# Patient Record
Sex: Female | Born: 1970 | Race: White | Hispanic: No | Smoking: Current some day smoker
Health system: Southern US, Community
[De-identification: ages and names within clinical notes are randomized; demographics above are authoritative.]

## PROBLEM LIST (undated history)

## (undated) ENCOUNTER — Inpatient Hospital Stay: Payer: Self-pay | Admitting: Obstetrics and Gynecology

## (undated) DIAGNOSIS — T4145XA Adverse effect of unspecified anesthetic, initial encounter: Secondary | ICD-10-CM

## (undated) DIAGNOSIS — I10 Essential (primary) hypertension: Secondary | ICD-10-CM

## (undated) DIAGNOSIS — T8859XA Other complications of anesthesia, initial encounter: Secondary | ICD-10-CM

## (undated) DIAGNOSIS — F329 Major depressive disorder, single episode, unspecified: Secondary | ICD-10-CM

## (undated) DIAGNOSIS — F32A Depression, unspecified: Secondary | ICD-10-CM

## (undated) DIAGNOSIS — E7800 Pure hypercholesterolemia, unspecified: Secondary | ICD-10-CM

## (undated) DIAGNOSIS — I2699 Other pulmonary embolism without acute cor pulmonale: Secondary | ICD-10-CM

## (undated) DIAGNOSIS — I219 Acute myocardial infarction, unspecified: Secondary | ICD-10-CM

## (undated) DIAGNOSIS — J45909 Unspecified asthma, uncomplicated: Secondary | ICD-10-CM

## (undated) DIAGNOSIS — E119 Type 2 diabetes mellitus without complications: Secondary | ICD-10-CM

## (undated) DIAGNOSIS — I639 Cerebral infarction, unspecified: Secondary | ICD-10-CM

## (undated) DIAGNOSIS — R519 Headache, unspecified: Secondary | ICD-10-CM

## (undated) DIAGNOSIS — K3184 Gastroparesis: Secondary | ICD-10-CM

## (undated) DIAGNOSIS — F419 Anxiety disorder, unspecified: Secondary | ICD-10-CM

## (undated) DIAGNOSIS — C539 Malignant neoplasm of cervix uteri, unspecified: Secondary | ICD-10-CM

## (undated) HISTORY — PX: TUBAL LIGATION: SHX77

## (undated) HISTORY — PX: TONSILLECTOMY: SUR1361

---
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
## 2005-02-09 DIAGNOSIS — C539 Malignant neoplasm of cervix uteri, unspecified: Secondary | ICD-10-CM

## 2005-02-09 HISTORY — DX: Malignant neoplasm of cervix uteri, unspecified: C53.9

## 2011-02-10 DIAGNOSIS — I639 Cerebral infarction, unspecified: Secondary | ICD-10-CM

## 2011-02-10 HISTORY — DX: Cerebral infarction, unspecified: I63.9

## 2014-02-09 DIAGNOSIS — I219 Acute myocardial infarction, unspecified: Secondary | ICD-10-CM

## 2014-02-09 HISTORY — DX: Acute myocardial infarction, unspecified: I21.9

## 2014-10-25 DIAGNOSIS — F319 Bipolar disorder, unspecified: Secondary | ICD-10-CM | POA: Insufficient documentation

## 2015-08-08 DIAGNOSIS — F1120 Opioid dependence, uncomplicated: Secondary | ICD-10-CM | POA: Insufficient documentation

## 2015-08-08 DIAGNOSIS — F603 Borderline personality disorder: Secondary | ICD-10-CM | POA: Insufficient documentation

## 2016-01-09 DIAGNOSIS — Z9181 History of falling: Secondary | ICD-10-CM | POA: Insufficient documentation

## 2016-03-03 DIAGNOSIS — T1491XA Suicide attempt, initial encounter: Secondary | ICD-10-CM | POA: Insufficient documentation

## 2016-06-15 DIAGNOSIS — R632 Polyphagia: Secondary | ICD-10-CM | POA: Insufficient documentation

## 2016-08-02 DIAGNOSIS — F1990 Other psychoactive substance use, unspecified, uncomplicated: Secondary | ICD-10-CM | POA: Diagnosis not present

## 2016-08-02 DIAGNOSIS — E119 Type 2 diabetes mellitus without complications: Secondary | ICD-10-CM | POA: Insufficient documentation

## 2016-08-02 DIAGNOSIS — G9340 Encephalopathy, unspecified: Secondary | ICD-10-CM | POA: Diagnosis not present

## 2016-08-02 DIAGNOSIS — E1165 Type 2 diabetes mellitus with hyperglycemia: Secondary | ICD-10-CM | POA: Diagnosis present

## 2016-08-02 NOTE — ED Triage Notes (Signed)
Patient presents with complaints of hyperglycemia. Her SO states she has taken her insulin like she is supposed to but is having trouble speaking clearing despite denying alcohol or drug use.

## 2016-08-03 ENCOUNTER — Emergency Department: Payer: Medicare Other

## 2016-08-03 ENCOUNTER — Emergency Department
Admission: EM | Admit: 2016-08-03 | Discharge: 2016-08-03 | Disposition: A | Discharge status: Home / Self Care | Payer: Medicare Other | Attending: Emergency Medicine | Admitting: Emergency Medicine

## 2016-08-03 DIAGNOSIS — F1910 Other psychoactive substance abuse, uncomplicated: Secondary | ICD-10-CM

## 2016-08-03 DIAGNOSIS — G9340 Encephalopathy, unspecified: Secondary | ICD-10-CM

## 2016-08-03 HISTORY — DX: Type 2 diabetes mellitus without complications: E11.9

## 2016-08-03 LAB — URINALYSIS, COMPLETE (UACMP) WITH MICROSCOPIC
BACTERIA UA: NONE SEEN
Bilirubin Urine: NEGATIVE
GLUCOSE, UA: NEGATIVE mg/dL
HGB URINE DIPSTICK: NEGATIVE
KETONES UR: 5 mg/dL — AB
Leukocytes, UA: NEGATIVE
NITRITE: NEGATIVE
PROTEIN: 30 mg/dL — AB
Specific Gravity, Urine: 1.028 (ref 1.005–1.030)
pH: 5 (ref 5.0–8.0)

## 2016-08-03 LAB — DIFFERENTIAL
BASOS ABS: 0.1 10*3/uL (ref 0–0.1)
Basophils Relative: 1 %
EOS ABS: 0.3 10*3/uL (ref 0–0.7)
Eosinophils Relative: 3 %
Lymphocytes Relative: 37 %
Lymphs Abs: 4.1 10*3/uL — ABNORMAL HIGH (ref 1.0–3.6)
Monocytes Absolute: 0.6 10*3/uL (ref 0.2–0.9)
Monocytes Relative: 6 %
NEUTROS ABS: 6 10*3/uL (ref 1.4–6.5)
NEUTROS PCT: 53 %

## 2016-08-03 LAB — COMPREHENSIVE METABOLIC PANEL
ALBUMIN: 4.5 g/dL (ref 3.5–5.0)
ALT: 19 U/L (ref 14–54)
AST: 36 U/L (ref 15–41)
Alkaline Phosphatase: 83 U/L (ref 38–126)
Anion gap: 8 (ref 5–15)
BUN: 11 mg/dL (ref 6–20)
CHLORIDE: 105 mmol/L (ref 101–111)
CO2: 23 mmol/L (ref 22–32)
Calcium: 9.3 mg/dL (ref 8.9–10.3)
Creatinine, Ser: 1.52 mg/dL — ABNORMAL HIGH (ref 0.44–1.00)
GFR calc Af Amer: 46 mL/min — ABNORMAL LOW (ref >60)
GFR calc non Af Amer: 40 mL/min — ABNORMAL LOW (ref >60)
GLUCOSE: 215 mg/dL — AB (ref 65–99)
POTASSIUM: 4.6 mmol/L (ref 3.5–5.1)
SODIUM: 136 mmol/L (ref 135–145)
Total Bilirubin: 1 mg/dL (ref 0.3–1.2)
Total Protein: 8.4 g/dL — ABNORMAL HIGH (ref 6.5–8.1)

## 2016-08-03 LAB — CBC
HCT: 40.4 % (ref 35.0–47.0)
Hemoglobin: 14 g/dL (ref 12.0–16.0)
MCH: 31.3 pg (ref 26.0–34.0)
MCHC: 34.6 g/dL (ref 32.0–36.0)
MCV: 90.4 fL (ref 80.0–100.0)
PLATELETS: 221 10*3/uL (ref 150–440)
RBC: 4.46 MIL/uL (ref 3.80–5.20)
RDW: 14 % (ref 11.5–14.5)
WBC: 11.1 10*3/uL — AB (ref 3.6–11.0)

## 2016-08-03 LAB — URINE DRUG SCREEN, QUALITATIVE (ARMC ONLY)
Amphetamines, Ur Screen: NOT DETECTED
Barbiturates, Ur Screen: NOT DETECTED
Benzodiazepine, Ur Scrn: POSITIVE — AB
COCAINE METABOLITE, UR ~~LOC~~: NOT DETECTED
Cannabinoid 50 Ng, Ur ~~LOC~~: NOT DETECTED
MDMA (ECSTASY) UR SCREEN: NOT DETECTED
Methadone Scn, Ur: NOT DETECTED
OPIATE, UR SCREEN: POSITIVE — AB
Phencyclidine (PCP) Ur S: NOT DETECTED
TRICYCLIC, UR SCREEN: POSITIVE — AB

## 2016-08-03 LAB — GLUCOSE, CAPILLARY
GLUCOSE-CAPILLARY: 196 mg/dL — AB (ref 65–99)
GLUCOSE-CAPILLARY: 231 mg/dL — AB (ref 65–99)

## 2016-08-03 LAB — POCT PREGNANCY, URINE: Preg Test, Ur: NEGATIVE

## 2016-08-03 LAB — ETHANOL

## 2016-08-03 MED ORDER — SODIUM CHLORIDE 0.9 % IV BOLUS (SEPSIS)
1000.0000 mL | Freq: Once | INTRAVENOUS | Status: AC
  Administered 2016-08-03: 1000 mL via INTRAVENOUS

## 2016-08-03 MED ORDER — NALOXONE HCL 2 MG/2ML IJ SOSY
0.4000 mg | PREFILLED_SYRINGE | Freq: Once | INTRAMUSCULAR | Status: AC
  Administered 2016-08-03: 0.4 mg via INTRAVENOUS

## 2016-08-03 MED ORDER — NALOXONE HCL 2 MG/2ML IJ SOSY
PREFILLED_SYRINGE | INTRAMUSCULAR | Status: AC
  Filled 2016-08-03: qty 2

## 2016-08-03 NOTE — ED Provider Notes (Signed)
[facility] Emergency Department Provider Note  ____________________________________________  Time seen: Approximately 12:53 AM  I have reviewed the triage vital signs and the nursing notes.   HISTORY  Chief Complaint Hyperglycemia  Level 5 caveat:  Portions of the history and physical were unable to be obtained due to ams   HPI Alejandra Frederick is a 46 y.o. female with a history of diabetes, PTSD, and anxiety who presents for evaluation of hyperglycemia. Patient initially presented to triage with complaints of elevated blood glucose. After being in the waiting room for 30 minutes patient became unresponsive. She has her eyes opened starring. With sternal rub the patient will make eye contact and attempts to speak and tell me that she has a very bad headache. She is accompanied by her fianc of 3 months. He tells me that he has never seen patient do this before. He denies that she uses any drugs. he denies that she took anything in the waiting room. He said that he brought her here today because she was having a hard time controlling her sugars. She was otherwise well. All patient mumbles is "I have a bad headache". She does follow commands and move all 4 extremities.   Past Medical History:  Diagnosis Date  . Diabetes mellitus without complication ([place])     There are no active problems to display for this patient.   History reviewed. No pertinent surgical history.  Prior to Admission medications   Not on File    Allergies Ciprofloxacin  No family history on file.  Social History Social History  Substance Use Topics  . Smoking status: Not on file  . Smokeless tobacco: Not on file  . Alcohol use Not on file    Review of Systems Level 5 caveat:  Portions of the history and physical were unable to be obtained due to ams  ____________________________________________   PHYSICAL EXAM:  VITAL SIGNS: ED Triage Vitals  Enc Vitals Group   BP 08/02/16 2357 119/72     Pulse Rate 08/02/16 2356 98     Resp 08/02/16 2356 18     Temp --      Temp Source 08/02/16 2356 Oral     SpO2 08/02/16 2357 97 %     Weight 08/02/16 2356 167 lb (75.8 kg)     Height 08/02/16 2356 5\' 4"  (1.626 m)     Head Circumference --      Peak Flow --      Pain Score 08/02/16 2355 0     Pain Loc --      Pain Edu? --      Excl. in [place]? --     Constitutional: Patient's eyes are open and staring to the wall, and I talked to her she would not respond but when I sternal rubbed her she will look at me and starts mumbling words, she says her head hurts. HEENT:      Head: Normocephalic and atraumatic.         Eyes: Conjunctivae are normal. Sclera is non-icteric. Pupils are 2 mm and reactive      Mouth/Throat: Mucous membranes are moist.       Neck: Supple with no signs of meningismus. Cardiovascular: Regular rate and rhythm. No murmurs, gallops, or rubs. 2+ symmetrical distal pulses are present in all extremities. No JVD. Respiratory: Normal respiratory effort. Lungs are clear to auscultation bilaterally. No wheezes, crackles, or rhonchi.  Gastrointestinal: Soft, non tender, and non distended with positive bowel  sounds. No rebound or guarding. Musculoskeletal: Nontender with normal range of motion in all extremities. No edema, cyanosis, or erythema of extremities. Neurologic: Patient is able to follow commands. Skin: Skin is warm, dry and intact. No rash noted.  ____________________________________________   LABS (all labs ordered are listed, but only abnormal results are displayed)  Labs Reviewed  GLUCOSE, CAPILLARY - Abnormal; Notable for the following:       Result Value   Glucose-Capillary 231 (*)    All other components within normal limits  CBC - Abnormal; Notable for the following:    WBC 11.1 (*)    All other components within normal limits  DIFFERENTIAL - Abnormal; Notable for the following:    Lymphs Abs 4.1 (*)    All other components  within normal limits  COMPREHENSIVE METABOLIC PANEL - Abnormal; Notable for the following:    Glucose, Bld 215 (*)    Creatinine, Ser 1.52 (*)    Total Protein 8.4 (*)    GFR calc non Af Amer 40 (*)    GFR calc Af Amer 46 (*)    All other components within normal limits  URINE DRUG SCREEN, QUALITATIVE ([facility] ONLY) - Abnormal; Notable for the following:    Tricyclic, Ur Screen POSITIVE (*)    Opiate, Ur Screen POSITIVE (*)    Benzodiazepine, Ur Scrn POSITIVE (*)    All other components within normal limits  URINALYSIS, COMPLETE (UACMP) WITH MICROSCOPIC - Abnormal; Notable for the following:    Color, Urine AMBER (*)    APPearance HAZY (*)    Ketones, ur 5 (*)    Protein, ur 30 (*)    Squamous Epithelial / LPF 0-5 (*)    All other components within normal limits  GLUCOSE, CAPILLARY - Abnormal; Notable for the following:    Glucose-Capillary 196 (*)    All other components within normal limits  ETHANOL  PROTIME-INR  APTT  POC URINE PREG, ED  POCT PREGNANCY, URINE   ____________________________________________  EKG  ED ECG REPORT I, Rudene Re, the attending physician, personally viewed and interpreted this ECG.  Sinus tachycardia, rate of 106, normal intervals, normal axis, no ST elevations or depressions. ____________________________________________  RADIOLOGY  CT head: negative  ____________________________________________   PROCEDURES  Procedure(s) performed: None Procedures Critical Care performed:  None ____________________________________________   INITIAL IMPRESSION / ASSESSMENT AND PLAN / ED COURSE  46 y.o. female with a history of diabetes, PTSD, and anxiety who presents for evaluation of hyperglycemia and developed a sudden episode of unresponsiveness however she does respond with sternal rubbing and is moving all 4 extremities and following commands. And after talking to me patient goes back to staring at the wall but will respond again if I  sternal rubbed her. Her head CT is negative. Do not believe patient had an acute stroke as she is moving all 4 extremities. Will check tox screen, labs, monitor closely.   ----------------------------------------- 12:57 AM on 08/03/2016 -----------------------------------------   OBSERVATION CARE: This patient is being placed under observation care for the following reasons: Questionable overdose observed to r/o significant toxicity    Clinical Course as of Aug 04 755  Mon Aug 03, 2016  0243 Tox screen positive for tricyclics, opiates, and benzos. Patient was given 0.4 mg of Narcan with no changes in her behavior. Patient noted to be speaking with her fianc when we are not in the room. We'll continue to monitor the patient looks clinically sober.  [CV]  [phone] Patient is now more  awake. Tells me that she took Klonopin, gabapentin, vicodin, and buspar just before coming to the ED. She is prescribed all of these medications and tells me that she usually takes those right before bedtime. She took them and then decided to come to the emergency room because of her elevated BG. She says she feels groggy but is asking for coffee. we'll give her breakfast. She remains neuro intact. She has no complaints at this time. Fianc remains at bedside and able drive patient home when she is discharged.  [CV]  [id number] Patient is now back to her baseline, tolerating by mouth and ambulating without difficulty. Had a very long discussion with her and her fianc about the risks of mixing all of these medications including risks of apnea, death, or if she is driving under the influence that she could hurt herself or others. Recommended that she discontinue one of the 3 medications and follow-up with her primary care doctor this week for further management.  [CV]    Clinical Course User Index [CV] Rudene Re, MD   ----------------------------------------- 7:57 AM on  08/03/2016 -----------------------------------------   END OF OBSERVATION STATUS: After an appropriate period of observation, this patient is being discharged     Pertinent labs & imaging results that were available during my care of the patient were reviewed by me and considered in my medical decision making (see chart for details).    ____________________________________________   FINAL CLINICAL IMPRESSION(S) / ED DIAGNOSES  Final diagnoses:  Encephalopathy acute  Polysubstance abuse      NEW MEDICATIONS STARTED DURING THIS VISIT:  New Prescriptions   No medications on file     Note:  This document was prepared using Dragon voice recognition software and may include unintentional dictation errors.    Rudene Re, MD 08/03/16 [phone]

## 2016-08-03 NOTE — Discharge Instructions (Signed)
Follow up with your doctor in 2 days. Be careful when taking too many sedating medications as you can stop breathing.

## 2016-08-03 NOTE — ED Notes (Signed)
Pt's visitor out to desk stating pt is awake and c/o a "really really bad headache"; notified primary nurse of same

## 2016-08-03 NOTE — ED Notes (Signed)
Pt's monitor alarming; BP reading low; cuff adjusted and recheck is 106/74; pt shivering; temp 97.7; thermostat adjusted in room and pt covered with more warm blankets

## 2016-08-03 NOTE — ED Notes (Signed)
Pt came back to room in wheelchair, pt was unable to get into bed, patient was helped into bed by staff and was unresponsive to voice, touch, pain, sometimes patient will respond but only for a few minutes. MD called to room to access.

## 2016-08-03 NOTE — ED Notes (Signed)
Pt sitting up in the chair with husband, a/ox4. Pt states she is ready to go home and EDP is aware.

## 2016-08-22 ENCOUNTER — Emergency Department: Payer: Medicare Other

## 2016-08-22 ENCOUNTER — Inpatient Hospital Stay
Admission: EM | Admit: 2016-08-22 | Discharge: 2016-08-24 | DRG: 917 | Disposition: A | Discharge status: Home / Self Care | Payer: Medicare Other | Attending: Internal Medicine | Admitting: Internal Medicine

## 2016-08-22 DIAGNOSIS — T50901A Poisoning by unspecified drugs, medicaments and biological substances, accidental (unintentional), initial encounter: Principal | ICD-10-CM

## 2016-08-22 DIAGNOSIS — F3160 Bipolar disorder, current episode mixed, unspecified: Secondary | ICD-10-CM | POA: Diagnosis present

## 2016-08-22 DIAGNOSIS — J9601 Acute respiratory failure with hypoxia: Secondary | ICD-10-CM

## 2016-08-22 DIAGNOSIS — E872 Acidosis: Secondary | ICD-10-CM | POA: Diagnosis present

## 2016-08-22 DIAGNOSIS — T50901D Poisoning by unspecified drugs, medicaments and biological substances, accidental (unintentional), subsequent encounter: Secondary | ICD-10-CM | POA: Diagnosis not present

## 2016-08-22 DIAGNOSIS — R4189 Other symptoms and signs involving cognitive functions and awareness: Secondary | ICD-10-CM | POA: Diagnosis present

## 2016-08-22 DIAGNOSIS — Z881 Allergy status to other antibiotic agents status: Secondary | ICD-10-CM

## 2016-08-22 DIAGNOSIS — Z915 Personal history of self-harm: Secondary | ICD-10-CM

## 2016-08-22 DIAGNOSIS — E118 Type 2 diabetes mellitus with unspecified complications: Secondary | ICD-10-CM | POA: Diagnosis present

## 2016-08-22 DIAGNOSIS — Z79899 Other long term (current) drug therapy: Secondary | ICD-10-CM | POA: Diagnosis not present

## 2016-08-22 DIAGNOSIS — R030 Elevated blood-pressure reading, without diagnosis of hypertension: Secondary | ICD-10-CM | POA: Diagnosis present

## 2016-08-22 DIAGNOSIS — Z87898 Personal history of other specified conditions: Secondary | ICD-10-CM

## 2016-08-22 DIAGNOSIS — J9600 Acute respiratory failure, unspecified whether with hypoxia or hypercapnia: Secondary | ICD-10-CM | POA: Diagnosis present

## 2016-08-22 DIAGNOSIS — R4020 Unspecified coma: Secondary | ICD-10-CM | POA: Diagnosis not present

## 2016-08-22 DIAGNOSIS — R1110 Vomiting, unspecified: Secondary | ICD-10-CM

## 2016-08-22 DIAGNOSIS — Z794 Long term (current) use of insulin: Secondary | ICD-10-CM

## 2016-08-22 DIAGNOSIS — G9340 Encephalopathy, unspecified: Secondary | ICD-10-CM | POA: Diagnosis present

## 2016-08-22 DIAGNOSIS — R55 Syncope and collapse: Secondary | ICD-10-CM | POA: Diagnosis not present

## 2016-08-22 LAB — URINE DRUG SCREEN, QUALITATIVE (ARMC ONLY)
AMPHETAMINES, UR SCREEN: NOT DETECTED
BENZODIAZEPINE, UR SCRN: POSITIVE — AB
Barbiturates, Ur Screen: NOT DETECTED
Cannabinoid 50 Ng, Ur ~~LOC~~: NOT DETECTED
Cocaine Metabolite,Ur ~~LOC~~: NOT DETECTED
MDMA (ECSTASY) UR SCREEN: NOT DETECTED
METHADONE SCREEN, URINE: NOT DETECTED
OPIATE, UR SCREEN: NOT DETECTED
PHENCYCLIDINE (PCP) UR S: NOT DETECTED
Tricyclic, Ur Screen: POSITIVE — AB

## 2016-08-22 LAB — BASIC METABOLIC PANEL
ANION GAP: 11 (ref 5–15)
BUN: 11 mg/dL (ref 6–20)
CO2: 20 mmol/L — AB (ref 22–32)
Calcium: 9.5 mg/dL (ref 8.9–10.3)
Chloride: 105 mmol/L (ref 101–111)
Creatinine, Ser: 0.95 mg/dL (ref 0.44–1.00)
GFR calc Af Amer: >60 mL/min (ref >60)
GFR calc non Af Amer: >60 mL/min (ref >60)
GLUCOSE: 231 mg/dL — AB (ref 65–99)
POTASSIUM: 3.8 mmol/L (ref 3.5–5.1)
Sodium: 136 mmol/L (ref 135–145)

## 2016-08-22 LAB — URINALYSIS, COMPLETE (UACMP) WITH MICROSCOPIC
BACTERIA UA: NONE SEEN
BILIRUBIN URINE: NEGATIVE
Glucose, UA: 50 mg/dL — AB
Hgb urine dipstick: NEGATIVE
Ketones, ur: NEGATIVE mg/dL
Leukocytes, UA: NEGATIVE
Nitrite: NEGATIVE
PROTEIN: NEGATIVE mg/dL
SPECIFIC GRAVITY, URINE: 1.006 (ref 1.005–1.030)
pH: 6 (ref 5.0–8.0)

## 2016-08-22 LAB — CBC
HEMATOCRIT: 41.1 % (ref 35.0–47.0)
HEMOGLOBIN: 14.3 g/dL (ref 12.0–16.0)
MCH: 32 pg (ref 26.0–34.0)
MCHC: 34.7 g/dL (ref 32.0–36.0)
MCV: 92.1 fL (ref 80.0–100.0)
Platelets: 228 10*3/uL (ref 150–440)
RBC: 4.46 MIL/uL (ref 3.80–5.20)
RDW: 14.4 % (ref 11.5–14.5)
WBC: 10.4 10*3/uL (ref 3.6–11.0)

## 2016-08-22 LAB — GLUCOSE, CAPILLARY
GLUCOSE-CAPILLARY: 132 mg/dL — AB (ref 65–99)
Glucose-Capillary: 120 mg/dL — ABNORMAL HIGH (ref 65–99)
Glucose-Capillary: 135 mg/dL — ABNORMAL HIGH (ref 65–99)
Glucose-Capillary: 232 mg/dL — ABNORMAL HIGH (ref 65–99)

## 2016-08-22 LAB — LACTIC ACID, PLASMA
LACTIC ACID, VENOUS: 2.1 mmol/L — AB (ref 0.5–1.9)
Lactic Acid, Venous: 1.6 mmol/L (ref 0.5–1.9)

## 2016-08-22 LAB — SALICYLATE LEVEL: Salicylate Lvl: <7 mg/dL (ref 2.8–30.0)

## 2016-08-22 LAB — ETHANOL: Alcohol, Ethyl (B): <5 mg/dL (ref <5)

## 2016-08-22 LAB — TROPONIN I
TROPONIN I: 0.03 ng/mL — AB (ref <0.03)
Troponin I: 0.18 ng/mL (ref <0.03)

## 2016-08-22 LAB — TRIGLYCERIDES: TRIGLYCERIDES: 551 mg/dL — AB (ref <150)

## 2016-08-22 LAB — ACETAMINOPHEN LEVEL

## 2016-08-22 LAB — MRSA PCR SCREENING: MRSA BY PCR: NEGATIVE

## 2016-08-22 MED ORDER — PROPOFOL 1000 MG/100ML IV EMUL
INTRAVENOUS | Status: AC
  Filled 2016-08-22: qty 100

## 2016-08-22 MED ORDER — NALOXONE HCL 2 MG/2ML IJ SOSY
1.0000 mg | PREFILLED_SYRINGE | Freq: Once | INTRAMUSCULAR | Status: AC
  Administered 2016-08-22: 1 mg via INTRAVENOUS

## 2016-08-22 MED ORDER — ACETAMINOPHEN 325 MG PO TABS
650.0000 mg | ORAL_TABLET | Freq: Four times a day (QID) | ORAL | Status: DC | PRN
Start: 2016-08-22 — End: 2016-08-24
  Administered 2016-08-23: 650 mg via ORAL
  Filled 2016-08-22: qty 2

## 2016-08-22 MED ORDER — MIDAZOLAM HCL 5 MG/5ML IJ SOLN
4.0000 mg | Freq: Once | INTRAMUSCULAR | Status: AC
  Administered 2016-08-22: 4 mg via INTRAVENOUS

## 2016-08-22 MED ORDER — MIDAZOLAM HCL 2 MG/2ML IJ SOLN
2.0000 mg | INTRAMUSCULAR | Status: AC | PRN
  Administered 2016-08-22: 2 mg via INTRAVENOUS
  Administered 2016-08-22 – 2016-08-23 (×2): 4 mg via INTRAVENOUS
  Filled 2016-08-22: qty 2
  Filled 2016-08-22 (×2): qty 4

## 2016-08-22 MED ORDER — SODIUM CHLORIDE 0.9 % IV SOLN
INTRAVENOUS | Status: DC
  Administered 2016-08-22 (×2): via INTRAVENOUS

## 2016-08-22 MED ORDER — SUCCINYLCHOLINE CHLORIDE 20 MG/ML IJ SOLN
160.0000 mg | Freq: Once | INTRAMUSCULAR | Status: AC
  Administered 2016-08-22: 160 mg via INTRAVENOUS

## 2016-08-22 MED ORDER — NALOXONE HCL 4 MG/0.1ML NA LIQD: 1.0000 mg | NASAL | Status: DC

## 2016-08-22 MED ORDER — PROPOFOL 10 MG/ML IV BOLUS
40.0000 mg | Freq: Once | INTRAVENOUS | Status: AC
  Administered 2016-08-22: 40 mg via INTRAVENOUS
  Filled 2016-08-22: qty 20

## 2016-08-22 MED ORDER — ETOMIDATE 2 MG/ML IV SOLN
20.0000 mg | Freq: Once | INTRAVENOUS | Status: AC
  Administered 2016-08-22: 20 mg via INTRAVENOUS

## 2016-08-22 MED ORDER — FENTANYL CITRATE (PF) 100 MCG/2ML IJ SOLN
50.0000 ug | Freq: Once | INTRAMUSCULAR | Status: AC
Start: 2016-08-22 — End: 2016-08-22
  Administered 2016-08-22: 50 ug via INTRAVENOUS

## 2016-08-22 MED ORDER — PROPOFOL 1000 MG/100ML IV EMUL
INTRAVENOUS | Status: AC
  Administered 2016-08-22: 19:00:00
  Filled 2016-08-22: qty 100

## 2016-08-22 MED ORDER — CHLORHEXIDINE GLUCONATE 0.12% ORAL RINSE (MEDLINE KIT)
15.0000 mL | Freq: Two times a day (BID) | OROMUCOSAL | Status: DC
  Administered 2016-08-22 – 2016-08-23 (×2): 15 mL via OROMUCOSAL

## 2016-08-22 MED ORDER — PROPOFOL 1000 MG/100ML IV EMUL
5.0000 ug/kg/min | Freq: Once | INTRAVENOUS | Status: AC
  Administered 2016-08-22: 15 ug/kg/min via INTRAVENOUS
  Filled 2016-08-22: qty 100

## 2016-08-22 MED ORDER — PROPOFOL 1000 MG/100ML IV EMUL
5.0000 ug/kg/min | INTRAVENOUS | Status: DC
  Administered 2016-08-22: 60 ug/kg/min via INTRAVENOUS
  Administered 2016-08-23 (×2): 80 ug/kg/min via INTRAVENOUS
  Filled 2016-08-22 (×4): qty 100

## 2016-08-22 MED ORDER — MIDAZOLAM HCL 2 MG/2ML IJ SOLN
2.0000 mg | INTRAMUSCULAR | Status: DC | PRN
  Administered 2016-08-23: 4 mg via INTRAVENOUS
  Filled 2016-08-22: qty 4

## 2016-08-22 MED ORDER — PROPOFOL 1000 MG/100ML IV EMUL
5.0000 ug/kg/min | INTRAVENOUS | Status: DC
  Administered 2016-08-22: 60 ug/kg/min via INTRAVENOUS

## 2016-08-22 MED ORDER — INSULIN ASPART 100 UNIT/ML ~~LOC~~ SOLN
0.0000 [IU] | Freq: Three times a day (TID) | SUBCUTANEOUS | Status: DC
  Administered 2016-08-23: 1 [IU] via SUBCUTANEOUS
  Filled 2016-08-22: qty 1

## 2016-08-22 MED ORDER — SODIUM CHLORIDE 0.9 % IV BOLUS (SEPSIS)
1000.0000 mL | Freq: Once | INTRAVENOUS | Status: AC
  Administered 2016-08-22: 1000 mL via INTRAVENOUS

## 2016-08-22 MED ORDER — ENOXAPARIN SODIUM 40 MG/0.4ML ~~LOC~~ SOLN
40.0000 mg | SUBCUTANEOUS | Status: DC
  Administered 2016-08-22 – 2016-08-23 (×2): 40 mg via SUBCUTANEOUS
  Filled 2016-08-22 (×2): qty 0.4

## 2016-08-22 MED ORDER — ACETAMINOPHEN 650 MG RE SUPP: 650.0000 mg | Freq: Four times a day (QID) | RECTAL | Status: DC | PRN

## 2016-08-22 MED ORDER — MIDAZOLAM HCL 5 MG/5ML IJ SOLN
INTRAMUSCULAR | Status: AC
  Filled 2016-08-22: qty 5

## 2016-08-22 MED ORDER — PROPOFOL 10 MG/ML IV BOLUS
40.0000 mg | Freq: Once | INTRAVENOUS | Status: DC
  Filled 2016-08-22: qty 20

## 2016-08-22 MED ORDER — ONDANSETRON HCL 4 MG PO TABS: 4.0000 mg | ORAL_TABLET | Freq: Four times a day (QID) | ORAL | Status: DC | PRN

## 2016-08-22 MED ORDER — ONDANSETRON HCL 4 MG/2ML IJ SOLN
4.0000 mg | Freq: Four times a day (QID) | INTRAMUSCULAR | Status: DC | PRN
  Administered 2016-08-23: 4 mg via INTRAVENOUS
  Filled 2016-08-22: qty 2

## 2016-08-22 MED ORDER — ORAL CARE MOUTH RINSE
15.0000 mL | OROMUCOSAL | Status: DC
  Administered 2016-08-22 – 2016-08-23 (×5): 15 mL via OROMUCOSAL

## 2016-08-22 NOTE — ED Triage Notes (Signed)
Pt arrived via POV from home with reports of V/D, decreased appetite and headache. Pt c/o headache that started yesterday. Pt unable to keep head up. She is communicating some but is uncomprehensible.  Pt does respond to her name.

## 2016-08-22 NOTE — H&P (Signed)
[place] [facility] [place] NAME: Alejandra Frederick    MR#:  [id number]  DATE OF BIRTH:  October 26, 1970  DATE OF ADMISSION:  08/22/2016  PRIMARY CARE PHYSICIAN: Patient, No Pcp Per   REQUESTING/REFERRING PHYSICIAN: Dr Jacqualine Code  CHIEF COMPLAINT:   Patient was brought in and responsive. No family members at bedside. She is currently intubated on the ventilator. IVC commitment papers have been completed by ED physician HISTORY OF PRESENT ILLNESS:  Alejandra Frederick  is a 46 y.o. female with a known history of Diabetes and history of polysubstance abuse comes to the emergency room after she had some vomiting and diarrhea. She also had some decreased appetite and headache. According to the staff has been told patient took some Klonopin before she came to the emergency room. Not sure if this is her prescription medication. Patient increasingly became more lethargic and then became unresponsive. She would not respond to sternal rub. She received couple doses of IV Narcan wake up very momentarily and became very unresponsive. Patient was intubated and placed on the ventilator to protect her airways. IVC commitment papers were clear by ED physician NG tube was placed and significant amount of gastric contents were suctioned. Chest x-ray pending. Urine drug screen pending. CT head negative.  Patient was placed on IV propofol drip for sedation. Case discussed with ICU attending Dr. Rosita Fire No family members in the ED left message for [place] HISTORY:   Past Medical History:  Diagnosis Date  . Diabetes mellitus without complication ([place])     PAST SURGICAL HISTOIRY:  No past surgical history on file.  SOCIAL HISTORY:   Social History  Substance Use Topics  . Smoking status: Not on file  . Smokeless tobacco: Not on file  . Alcohol use Not on file    FAMILY HISTORY:  No family history on file.  DRUG ALLERGIES:   Allergies   Allergen Reactions  . Ciprofloxacin     REVIEW OF SYSTEMS:  Review of Systems  Unable to perform ROS: Intubated     MEDICATIONS AT HOME:   Prior to Admission medications   Not on File      VITAL SIGNS:  Blood pressure 123/87, pulse (!) 103, temperature 99 F (37.2 C), temperature source Oral, resp. rate 14, height 5\' 4"  (1.626 m), weight 80.1 kg (176 lb 9.4 oz), last menstrual period 07/26/2016, SpO2 100 %.  PHYSICAL EXAMINATION:  GENERAL:  46 y.o.-year-old patient lying in the bed with no acute distress. Patient intubated on the ventilator  EYES: Pupils equal, round, reactive to light and accommodation. No scleral icterus. Extraocular muscles intact.  HEENT: Head atraumatic, normocephalic. Oropharynx and nasopharynx clear.  NECK:  Supple, no jugular venous distention. No thyroid enlargement, no tenderness.  LUNGS: Normal breath sounds bilaterally, no wheezing, rales,rhonchi or crepitation. No use of accessory muscles of respiration.  CARDIOVASCULAR: S1, S2 normal. No murmurs, rubs, or gallops.  ABDOMEN: Soft, nontender, nondistended. Bowel sounds present. No organomegaly or mass.  EXTREMITIES: No pedal edema, cyanosis, or clubbing.  NEUIntubated PSYCHIATRIC:  intubated on the ventilator SKIN: No obvious rash, lesion, or ulcer.   LABORATORY PANEL:   CBC  Recent Labs Lab 08/22/16 1450  WBC 10.4  HGB 14.3  HCT 41.1  PLT 228   ------------------------------------------------------------------------------------------------------------------  Chemistries   Recent Labs Lab 08/22/16 1450  NA 136  K 3.8  CL 105  CO2 20*  GLUCOSE 231*  BUN 11  CREATININE 0.95  CALCIUM 9.5   ------------------------------------------------------------------------------------------------------------------  Cardiac Enzymes No results for input(s): TROPONINI in the last 168  hours. ------------------------------------------------------------------------------------------------------------------  RADIOLOGY:  Ct Head Wo Contrast  Result Date: 08/22/2016 CLINICAL DATA:  Unresponsiveness.  Fall from bed. EXAM: CT HEAD WITHOUT CONTRAST TECHNIQUE: Contiguous axial images were obtained from the base of the skull through the vertex without intravenous contrast. COMPARISON:  08/03/2016 head CT. FINDINGS: Brain: No evidence of parenchymal hemorrhage or extra-axial fluid collection. No mass lesion, mass effect, or midline shift. No CT evidence of acute infarction. Cerebral volume is age appropriate. No ventriculomegaly. Vascular: No hyperdense vessel or unexpected calcification. Skull: No evidence of calvarial fracture. Sinuses/Orbits: The visualized paranasal sinuses are essentially clear. Other:  The mastoid air cells are unopacified. IMPRESSION: Negative head CT.  No evidence of acute intracranial abnormality. Electronically Signed   By: Ilona Sorrel M.D.   On: 08/22/2016 15:19    EKG:  Sinus tachycardia.  IMPRESSION AND PLAN:   Alejandra Frederick  is a 46 y.o. female with a known history of Diabetes and history of polysubstance abuse comes to the emergency room after she had some vomiting and diarrhea. She also had some decreased appetite and headache. According to the staff has been told patient took some Klonopin before she came to the emergency room. Not sure if this is her prescription medication.  1. Acute encephalopathy/altered mental status/unresponsive -Etiology not clear -Patient got intubated on the ventilator for airway protection  - admit to ICU -IV propofol drip for sedation. When necessary IV Versed -Discussed with Dr. Rosita Fire -Urine drug screen pending -EtOH, salicylate, Tylenol levels negative -Left message for significant other to call -Chest x-ray pending  2. Elevated lactic acid -Continue to follow after IV fluids  3. Diabetes -Sliding-scale  insulin  4. DVT prophylaxis subcutaneous Lovenox  5. History of polysubstance abuse. Urine drug screen pending.   All the records are reviewed and case discussed with ED provider. Management plans discussed with the patient, family and they are in agreement.  CODE STATUS: full  TOTAL critical TIME TAKING CARE OF THIS PATIENT: 55  minutes.    Jasmarie Coppock M.D on 08/22/2016 at 4:06 PM  Between 7am to 6pm - Pager - [phone]  After 6pm go to [email or web address] - password [email or web address]  Office  [phone]  CC: Primary care physician; Patient, No Pcp Per

## 2016-08-22 NOTE — Progress Notes (Signed)
eLink Physician-Brief Progress Note Patient Name: Alejandra Frederick DOB: Aug 17, 1970 MRN: [id number]   Date of Service  08/22/2016  HPI/Events of Note  46 yr old female presented with acute depression of LOC leading to intubation.  Presumed overdose possibly related to benzos.  Presently stable from hemodynamic and resp standpoint.    eICU Interventions  Chart reviewed.  Full consultation from critical care to follow     Intervention Category Evaluation Type: New Patient Evaluation  Mauri Brooklyn, P 08/22/2016, 5:27 PM

## 2016-08-22 NOTE — Progress Notes (Signed)
Admitted from ED at 1702. Intubated on vent 28%/ 5p/ TV 450./ rate 16. On propofol gtt at 69mcg. NS at 125 ml/hr. BP stable. Lungs clear and diminished bibasilar. See assessment per flow sheet. Pt boyfriend only one present. He says her father, children and friends were notified of her admission to hospital. Will set up password when next of kin arrives. ABG improved and weaning fio2.

## 2016-08-22 NOTE — Progress Notes (Signed)
CRITICAL VALUE ALERT  Critical Value:  0.18-Troponin  Date & Time Notied:  08/22/2016 @ 11:45 pm  Provider Notified: Jeoffrey Massed  Orders Received/Actions taken: No new orders received.

## 2016-08-22 NOTE — ED Notes (Signed)
Pt fighting tube and agitated, EDP gave VO for versed, fentanyl and propofol drip and bolus.

## 2016-08-22 NOTE — ED Provider Notes (Signed)
[facility] Emergency Department Provider Note  ____________________________________________   First MD Initiated Contact with Patient 08/22/16 1445     (approximate)  I have reviewed the triage vital signs and the nursing notes.   HISTORY  Chief Complaint Loss of Consciousness  EM caveat: Patient unresponsive to verbal and painful stimuli  HPI Shambhavi Salley is a 46 y.o. female unknown previous medical history other than possible diabetes   Past Medical History:  Diagnosis Date  . Diabetes mellitus without complication [facility])     Patient Active Problem List   Diagnosis Date Noted  . Encephalopathy acute 08/22/2016    No past surgical history on file.  Prior to Admission medications   Not on File    Allergies Ciprofloxacin  No family history on file.  Social History Social History  Substance Use Topics  . Smoking status: Not on file  . Smokeless tobacco: Not on file  . Alcohol use Not on file    Review of Systems EM caveat  ____________________________________________   PHYSICAL EXAM:  VITAL SIGNS: ED Triage Vitals  Enc Vitals Group     BP 08/22/16 1432 135/81     Pulse Rate 08/22/16 1432 (!) 122     Resp 08/22/16 1432 14     Temp 08/22/16 1432 99 F (37.2 C)     Temp Source 08/22/16 1432 Oral     SpO2 08/22/16 1432 96 %     Weight 08/22/16 1452 176 lb 9.4 oz (80.1 kg)     Height 08/22/16 1451 5\' 4"  (1.626 m)     Head Circumference --      Peak Flow --      Pain Score --      Pain Loc --      Pain Edu? --      Excl. in [place]? --   Constitutional: Patient has eyes open, opens and closes his eyes to corneal reflex. She is otherwise unresponsive, staring blankly forward. Appear in no pain. She is flaccid. Eyes: Conjunctivae are normal. Pupils midpoint to slightly small but reactive. Head: Atraumatic. Nose: No congestion/rhinnorhea. Mouth/Throat: Mucous membranes are dry. Dentures removed from the upper  palate. Neck: No stridor.   Cardiovascular: Tachycardic rate, regular rhythm. Grossly normal heart sounds.  Good peripheral circulation. Respiratory: Bradycardia take with shallow respirations. Lungs CTAB. This patient is placed immediately onto nonrebreather Gastrointestinal: Soft and nontender. No distention. Musculoskeletal: No lower extremity tenderness nor edema. Flaccid in all extremities. No focal deficits. Does not withdraw to pain. Neurologic:  Unable to assess due to unresponsiveness Skin:  Skin is warm, dry and intact. No rash noted. Psychiatric: Unable to assess due to unresponsiveness  ____________________________________________   LABS (all labs ordered are listed, but only abnormal results are displayed)  Labs Reviewed  BASIC METABOLIC PANEL - Abnormal; Notable for the following:       Result Value   CO2 20 (*)    Glucose, Bld 231 (*)    All other components within normal limits  URINALYSIS, COMPLETE (UACMP) WITH MICROSCOPIC - Abnormal; Notable for the following:    Color, Urine YELLOW (*)    APPearance CLEAR (*)    Glucose, UA 50 (*)    Squamous Epithelial / LPF 0-5 (*)    All other components within normal limits  ACETAMINOPHEN LEVEL - Abnormal; Notable for the following:    Acetaminophen (Tylenol), Serum <10 (*)    All other components within normal limits  GLUCOSE, CAPILLARY - Abnormal; Notable for  the following:    Glucose-Capillary 232 (*)    All other components within normal limits  URINE DRUG SCREEN, QUALITATIVE ([facility] ONLY) - Abnormal; Notable for the following:    Tricyclic, Ur Screen POSITIVE (*)    Benzodiazepine, Ur Scrn POSITIVE (*)    All other components within normal limits  BLOOD GAS, VENOUS - Abnormal; Notable for the following:    Acid-base deficit 5.4 (*)    All other components within normal limits  LACTIC ACID, PLASMA - Abnormal; Notable for the following:    Lactic Acid, Venous 2.1 (*)    All other components within normal limits  CBC   SALICYLATE LEVEL  ETHANOL  LACTIC ACID, PLASMA  BLOOD GAS, ARTERIAL  CBG MONITORING, ED   ____________________________________________  EKG  ED ECG REPORT I, QUALE, MARK, the attending physician, personally viewed and interpreted this ECG.  Date: 08/22/2016 EKG Time: 1520 Rate: 95 Rhythm: normal sinus rhythm QRS Axis: normal Intervals: normal ST/T Wave abnormalities: normal Narrative Interpretation: unremarkable  ____________________________________________  RADIOLOGY  Ct Head Wo Contrast  Result Date: 08/22/2016 CLINICAL DATA:  Unresponsiveness.  Fall from bed. EXAM: CT HEAD WITHOUT CONTRAST TECHNIQUE: Contiguous axial images were obtained from the base of the skull through the vertex without intravenous contrast. COMPARISON:  08/03/2016 head CT. FINDINGS: Brain: No evidence of parenchymal hemorrhage or extra-axial fluid collection. No mass lesion, mass effect, or midline shift. No CT evidence of acute infarction. Cerebral volume is age appropriate. No ventriculomegaly. Vascular: No hyperdense vessel or unexpected calcification. Skull: No evidence of calvarial fracture. Sinuses/Orbits: The visualized paranasal sinuses are essentially clear. Other:  The mastoid air cells are unopacified. IMPRESSION: Negative head CT.  No evidence of acute intracranial abnormality. Electronically Signed   By: Ilona Sorrel M.D.   On: 08/22/2016 15:19    ____________________________________________   PROCEDURES  Procedure(s) performed: intubation  Procedures  Critical Care performed: Yes, see critical care note(s)  CRITICAL CARE Performed by: Delman Kitten   Total critical care time: 65 minutes  Critical care time was exclusive of separately billable procedures and treating other patients.  Critical care was necessary to treat or prevent imminent or life-threatening deterioration.  Critical care was time spent personally by me on the following activities: development of treatment plan  with patient and/or surrogate as well as nursing, discussions with consultants, evaluation of patient's response to treatment, examination of patient, obtaining history from patient or surrogate, ordering and performing treatments and interventions, ordering and review of laboratory studies, ordering and review of radiographic studies, pulse oximetry and re-evaluation of patient's condition.  INTUBATION Performed by: Delman Kitten  Required items: required blood products, implants, devices, and special equipment available Patient identity confirmed: provided demographic data and hospital-assigned identification number Time out: Immediately prior to procedure a "time out" was called to verify the correct patient, procedure, equipment, support staff and site/side marked as required.  Indications: Airway protection and unresponsiveness without gag reflex   Intubation method: Glidescope Laryngoscopy   Preoxygenation: BVM  Sedatives: Etomidate Paralytic: Succinylcholine  Tube Size: 7.5 cuffed at 21cm teeth  Post-procedure assessment: chest rise and ETCO2 monitor Breath sounds: equal and absent over the epigastrium Tube secured with: ETT holder  Chest x-ray findings: endotracheal tube in appropriate position reviewed by me  Patient tolerated the procedure well with no immediate complications.     _______ _____________________________________   INITIAL IMPRESSION / ASSESSMENT AND PLAN / ED COURSE  Pertinent labs & imaging results that were available during my care of the  patient were reviewed by me and considered in my medical decision making (see chart for details).  Patient presents with obtundation/unresponsive state. Etiology somewhat unclear the family reports she's had vomiting diarrhea, but also about one hour prior to arrival at taking Klonopin. Previous ED visit recently with concerns for polysubstance abuse. Unclear if there could be a component of intentional or  unintentional overdose or an acute medical condition. Proceed with broad workup including CT of the head, chest x-ray labs, toxic screen etc.  Clinical Course as of Aug 22 1632  Sat Aug 22, 2016  1455 After second dose of Narcan, patient now opening her eyes with increased respiratory rate. She indicates she is "in pain" by verbalizing this but can't describe where her why. She is somnolent, but no longer obtunded. Mental status improving. Protecting her airway well.  [MQ]    Clinical Course User Index [MQ] Delman Kitten, MD    ----------------------------------------- 3:54 PM on 08/22/2016 -----------------------------------------  Postintubation, patient awakening, attempting to remove endotracheal tube. Patient sedation improved after Versed fennel dosing, then a fall drip with bolus. Patient now tolerating endotracheal tube well. Stable hemodynamics with some hypertension now noted.  Patient being admitted to the medical hospitalist service/ICU at this time. Dr. Serita Grit with patient in [place] at present. Chest x-ray pending. Additional labs pending at this time. ____________________________________________   FINAL CLINICAL IMPRESSION(S) / ED DIAGNOSES  Final diagnoses:  Unresponsive  Unresponsiveness      NEW MEDICATIONS STARTED DURING THIS VISIT:  New Prescriptions   No medications on file     Note:  This document was prepared using Dragon voice recognition software and may include unintentional dictation errors.     Delman Kitten, MD 08/22/16 [phone]

## 2016-08-22 NOTE — ED Notes (Signed)
Pt trying to pull out tube again, EDP notified of told to bump up propofol drip and give another bolus from bag to sedate patient.

## 2016-08-23 ENCOUNTER — Inpatient Hospital Stay: Payer: Medicare Other

## 2016-08-23 ENCOUNTER — Encounter: Payer: Self-pay | Admitting: Adult Health

## 2016-08-23 DIAGNOSIS — R55 Syncope and collapse: Secondary | ICD-10-CM

## 2016-08-23 DIAGNOSIS — T50901A Poisoning by unspecified drugs, medicaments and biological substances, accidental (unintentional), initial encounter: Secondary | ICD-10-CM

## 2016-08-23 DIAGNOSIS — R4020 Unspecified coma: Secondary | ICD-10-CM

## 2016-08-23 DIAGNOSIS — J9601 Acute respiratory failure with hypoxia: Secondary | ICD-10-CM

## 2016-08-23 LAB — GLUCOSE, CAPILLARY
GLUCOSE-CAPILLARY: 148 mg/dL — AB (ref 65–99)
GLUCOSE-CAPILLARY: 133 mg/dL — AB (ref 65–99)
Glucose-Capillary: 172 mg/dL — ABNORMAL HIGH (ref 65–99)
Glucose-Capillary: 146 mg/dL — ABNORMAL HIGH (ref 65–99)
Glucose-Capillary: 170 mg/dL — ABNORMAL HIGH (ref 65–99)
Glucose-Capillary: 165 mg/dL — ABNORMAL HIGH (ref 65–99)

## 2016-08-23 LAB — BASIC METABOLIC PANEL
Anion gap: 5 (ref 5–15)
BUN: 8 mg/dL (ref 6–20)
CHLORIDE: 111 mmol/L (ref 101–111)
CO2: 21 mmol/L — AB (ref 22–32)
CREATININE: 0.78 mg/dL (ref 0.44–1.00)
Calcium: 8.2 mg/dL — ABNORMAL LOW (ref 8.9–10.3)
GFR calc Af Amer: >60 mL/min (ref >60)
GFR calc non Af Amer: >60 mL/min (ref >60)
Glucose, Bld: 150 mg/dL — ABNORMAL HIGH (ref 65–99)
Potassium: 3.6 mmol/L (ref 3.5–5.1)
Sodium: 137 mmol/L (ref 135–145)

## 2016-08-23 LAB — TROPONIN I: Troponin I: 0.15 ng/mL (ref <0.03)

## 2016-08-23 MED ORDER — INSULIN ASPART 100 UNIT/ML ~~LOC~~ SOLN
0.0000 [IU] | Freq: Three times a day (TID) | SUBCUTANEOUS | Status: DC
  Administered 2016-08-23: 3 [IU] via SUBCUTANEOUS
  Administered 2016-08-23: 2 [IU] via SUBCUTANEOUS
  Administered 2016-08-24 (×2): 3 [IU] via SUBCUTANEOUS
  Administered 2016-08-24: 2 [IU] via SUBCUTANEOUS
  Filled 2016-08-23 (×5): qty 1

## 2016-08-23 MED ORDER — IBUPROFEN 400 MG PO TABS: 400.0000 mg | ORAL_TABLET | Freq: Four times a day (QID) | ORAL | Status: DC | PRN

## 2016-08-23 MED ORDER — CEPASTAT 14.5 MG MT LOZG
1.0000 | LOZENGE | OROMUCOSAL | Status: DC | PRN
  Filled 2016-08-23 (×2): qty 9

## 2016-08-23 MED ORDER — FENTANYL CITRATE (PF) 100 MCG/2ML IJ SOLN
100.0000 ug | Freq: Once | INTRAMUSCULAR | Status: AC
  Administered 2016-08-23: 100 ug via INTRAVENOUS
  Filled 2016-08-23: qty 2

## 2016-08-23 MED ORDER — INSULIN ASPART 100 UNIT/ML ~~LOC~~ SOLN: 0.0000 [IU] | Freq: Every day | SUBCUTANEOUS | Status: DC

## 2016-08-23 NOTE — Progress Notes (Signed)
Patient is responsive to voice.  Sitter at bedside to assist with keeping patient calm. Patient continues on propofol. Patient received PRN meds multiple times for restlessness.  Will monitor.

## 2016-08-23 NOTE — Progress Notes (Signed)
Extubated without complications 

## 2016-08-23 NOTE — Progress Notes (Signed)
PULMONARY / CRITICAL CARE MEDICINE   Name: Alejandra Frederick MRN: [id number] DOB: 05-09-1970    ADMISSION DATE:  08/22/2016   CONSULTATION DATE:  08/22/16  REFERRING MD:  Posey Pronto  REASON: Acute respiratory failure and overdose  CHIEF COMPLAINT:  AMS  HISTORY OF PRESENT ILLNESS:   This is a 46 year old Caucasian female with a past medical history of polysubstance abuse and type 2 diabetes who presented to the ED with acute change in mental status. History is obtained from patient's significant other and from ED records as patient is currently intubated. The patient's significant other, patient had nausea and vomiting with generalized weakness for 3 days. Yesterday she went into the bathroom and passed out. Hence, he brought her to the emergency room. At the emergency room, patient's mentation continued to decline and she was emergently intubated for airway protection. She was given multiple doses of Narcan and her toxicology was positive for benzodiazepine and tricyclics. Per her significant other, she takes clonazepam at home and was given a dose the morning prior to ED arrival. Patient's significant other indicates that he has been responsible for patient's medications and has been administering them as directed. Patient is also on amitriptyline and gabapentin. Per the significant other, her medication doses were recently adjusted. Unfortunately I am not able to verify this information as I currently don't have the patient's medication list   SUBJECTIVE: No acute issues overnight. Intermittent agitation with attempts to self extubate. Patient awake and following commands during wake-up assessment. Pressure support ventilation initiated and patient was effectively extubated. She is now awake and complaining of a sore throat. Denies any other symptoms.  VITAL SIGNS: BP 111/82 (BP Location: Left Arm)   Pulse 93   Temp 98.4 F (36.9 C) (Oral)   Resp (!) 21   Ht 5\' 7"  (1.702 m)   Wt 176 lb 5.9 oz  (80 kg)   LMP 07/26/2016 (LMP Unknown)   SpO2 100%   BMI 27.62 kg/m   HEMODYNAMICS:    VENTILATOR SETTINGS: Vent Mode: PRVC FiO2 (%):  [28 %-40 %] 28 % Set Rate:  [16 bmp] 16 bmp Vt Set:  [450 mL] 450 mL PEEP:  [5 cmH20] 5 cmH20 Plateau Pressure:  [16 cmH20] 16 cmH20  INTAKE / OUTPUT: I/O last 3 completed shifts: In: 3354.4 [I.V.:2054.4; Other:300; IV Piggyback:1000] Out: 640 [Urine:430; Emesis/NG output:210]  PHYSICAL EXAMINATION: General:  Awake, restless Neuro:  Able to nod yes and no, following commands, moves all extremities HEENT:  PERRLA, trachea midline, oral mucosa with moderate secretions Cardiovascular: Apical pulse regular, S1, S2 audible, no murmur reculture gallop Lungs:  Clear to auscultation bilaterally; extubated Abdomen: Soft, nontender, positive bowel sounds, no focal Musculoskeletal:  No deformities, no joint swelling Skin:  Dry, no track marks, no rash  LABS:  BMET  Recent Labs Lab 08/22/16 1450 08/23/16 0501  NA 136 137  K 3.8 3.6  CL 105 111  CO2 20* 21*  BUN 11 8  CREATININE 0.95 0.78  GLUCOSE 231* 150*    Electrolytes  Recent Labs Lab 08/22/16 1450 08/23/16 0501  CALCIUM 9.5 8.2*    CBC  Recent Labs Lab 08/22/16 1450  WBC 10.4  HGB 14.3  HCT 41.1  PLT 228    Coag's No results for input(s): APTT, INR in the last 168 hours.  Sepsis Markers  Recent Labs Lab 08/22/16 1510 08/22/16 1710  LATICACIDVEN 2.1* 1.6    ABG  Recent Labs Lab 08/22/16 1554  PHART 7.38  PCO2ART 35  PO2ART 179*    Liver Enzymes No results for input(s): AST, ALT, ALKPHOS, BILITOT, ALBUMIN in the last 168 hours.  Cardiac Enzymes  Recent Labs Lab 08/22/16 1710 08/22/16 2259 08/23/16 0501  TROPONINI 0.03* 0.18* 0.15*    Glucose  Recent Labs Lab 08/22/16 1439 08/22/16 1706 08/22/16 2112 08/22/16 2349 08/23/16 0356 08/23/16 0735  GLUCAP 232* 132* 135* 120* 172* 148*    Imaging Dg Abd 1 View  Result Date:  08/23/2016 CLINICAL DATA:  Unresponsive.  Vomiting and diarrhea. EXAM: ABDOMEN - 1 VIEW COMPARISON:  Abdominal radiograph August 22, 2016 FINDINGS: Nasogastric tube tip projects in distal stomach, similarly position. Paucity of bowel gas. No intra-abdominal mass effect or pathologic calcifications. Tubal ligation clips project in the pelvis. Phleboliths project in the pelvis. Soft tissue planes and included osseous structures are nonsuspicious. IMPRESSION: Nasogastric tube tip projects in distal stomach. Nonspecific bowel gas pattern. Electronically Signed   By: Elon Alas M.D.   On: 08/23/2016 01:56   Ct Head Wo Contrast  Result Date: 08/22/2016 CLINICAL DATA:  Unresponsiveness.  Fall from bed. EXAM: CT HEAD WITHOUT CONTRAST TECHNIQUE: Contiguous axial images were obtained from the base of the skull through the vertex without intravenous contrast. COMPARISON:  08/03/2016 head CT. FINDINGS: Brain: No evidence of parenchymal hemorrhage or extra-axial fluid collection. No mass lesion, mass effect, or midline shift. No CT evidence of acute infarction. Cerebral volume is age appropriate. No ventriculomegaly. Vascular: No hyperdense vessel or unexpected calcification. Skull: No evidence of calvarial fracture. Sinuses/Orbits: The visualized paranasal sinuses are essentially clear. Other:  The mastoid air cells are unopacified. IMPRESSION: Negative head CT.  No evidence of acute intracranial abnormality. Electronically Signed   By: Ilona Sorrel M.D.   On: 08/22/2016 15:19   Dg Chest Portable 1 View  Result Date: 08/22/2016 CLINICAL DATA:  Intubation EXAM: PORTABLE CHEST 1 VIEW COMPARISON:  None. FINDINGS: Endotracheal tube is 3 cm above the carina. NG tube is in the stomach. There is bibasilar atelectasis. Heart is normal size. IMPRESSION: Endotracheal tube 3 cm above the carina. Bibasilar atelectasis. Electronically Signed   By: Rolm Baptise M.D.   On: 08/22/2016 16:50   Dg Abd Portable 1 View  Result  Date: 08/22/2016 CLINICAL DATA:  OG tube placement EXAM: PORTABLE ABDOMEN - 1 VIEW COMPARISON:  None. FINDINGS: OG tube tip is in the distal stomach. Nonobstructive bowel gas pattern. Bilateral tubal ligation clips noted. IMPRESSION: OG tube tip in the distal stomach. Electronically Signed   By: Rolm Baptise M.D.   On: 08/22/2016 16:51     STUDIES:  None  CULTURES: None  ANTIBIOTICS: None  SIGNIFICANT EVENTS: 08/22/2016: ED with acute change in mental status, admitted for possible overdose, IVC by the [facility] police  LINES/TUBES: Peripheral IVs  DISCUSSION: 46 year old Caucasian female presenting with acute encephalopathy secondary to possible overdose  ASSESSMENT  Possible overdose-intentional versus unintentional, toxicology positive for benzodiazepine and tricyclics for which patient has a prescription per her significant other. Acute encephalopathy. Acute respiratory failure secondary to severe encephalopathy. Mild Lactic acidosis History of type 2 diabetes  PLAN Monitor respiratory status post extubation Chloraseptic lozenges for sore throat Continue IV fluids Hemodynamics per ICU protocol Discontinue all sedating Blood glucose monitoring with sliding-scale insulin coverage. GI and DVT prophylaxis    FAMILY  - Updates: No family at bedside. We'll updated when available - Inter-disciplinary family meet or Palliative Care meeting due by:  day Kress. Tukov ANP-BC Pulmonary and Critical Care Medicine  [employer] Pager [phone] or [phone]  08/23/2016, 8:55 AM  PCCM ATTENDING ATTESTATION:  I have evaluated patient with the APP Tukov, reviewed database in its entirety and discussed care plan in detail. In addition, this patient was discussed on multidisciplinary rounds.   Important exam findings: She is tolerating extubation on RA Exam normal  She adamantly denies intentional OD or intent of self harm She remembers severe HA and  feeling unusual just before syncopal episode  Major problems addressed by PCCM team: Syncope, LOC of unclear etiology Acute respiratory failure due to AMS - resolved  PLAN/REC: I have requested Neurology eval  Psych eval has already been requested Transfer to med-surg with continued sitter After transfer, PCCM will sign off. Please call if we can be of further assistance   Merton Border, MD PCCM service Mobile [phone] Pager [phone] 08/23/2016 12:45 PM

## 2016-08-23 NOTE — Progress Notes (Signed)
Patient resting in bed with significant other at bedside. No acute distress noted. Awaiting transfer to med surg floor.  No complaints at this time.  Sitter in room.

## 2016-08-23 NOTE — Consult Note (Signed)
PULMONARY / CRITICAL CARE MEDICINE   Name: Alejandra Frederick MRN: [id number] DOB: 1970/10/22    ADMISSION DATE:  08/22/2016   CONSULTATION DATE:  08/22/16  REFERRING MD:  Posey Pronto  REASON: Acute respiratory failure and overdose  CHIEF COMPLAINT:  AMS  HISTORY OF PRESENT ILLNESS:   This is a 46 year old Caucasian female with a past medical history of polysubstance abuse and type 2 diabetes who presented to the ED with acute change in mental status. History is obtained from patient's significant other and from ED records as patient is currently intubated. The patient's significant other, patient had nausea and vomiting with generalized weakness for 3 days. Yesterday she went into the bathroom and passed out. Hence, he brought her to the emergency room. At the emergency room, patient's mentation continued to decline and she was emergently intubated for airway protection. She was given multiple doses of Narcan and her toxicology was positive for benzodiazepine and tricyclics. Per her significant other, she takes clonazepam at home and was given a dose the morning prior to ED arrival. Patient's significant other indicates that he has been responsible for patient's medications and has been administering them as directed. Patient is also on amitriptyline and gabapentin. Per the significant other, her medication doses were recently adjusted. Unfortunately I am not able to verify this information as I currently don't have the patient's medication list  PAST MEDICAL HISTORY :  She  has a past medical history of Diabetes mellitus without complication ([place]).  PAST SURGICAL HISTORY: She  has no past surgical history on file.  Allergies  Allergen Reactions  . Ciprofloxacin     No current facility-administered medications on file prior to encounter.    No current outpatient prescriptions on file prior to encounter.    FAMILY HISTORY:  Her has no family status information on file.    SOCIAL  HISTORY: She    REVIEW OF SYSTEMS:   Unable to obtain as patient is sedated and intubated  SUBJECTIVE:    VITAL SIGNS: BP 104/75   Pulse 89   Temp 98.4 F (36.9 C) (Oral)   Resp 16   Ht 5\' 7"  (1.702 m)   Wt 176 lb 5.9 oz (80 kg)   LMP 07/26/2016 (LMP Unknown)   SpO2 99%   BMI 27.62 kg/m   HEMODYNAMICS:    VENTILATOR SETTINGS: Vent Mode: PRVC FiO2 (%):  [28 %-40 %] 28 % Set Rate:  [16 bmp] 16 bmp Vt Set:  [450 mL] 450 mL PEEP:  [5 cmH20] 5 cmH20 Plateau Pressure:  [16 cmH20] 16 cmH20  INTAKE / OUTPUT: I/O last 3 completed shifts: In: 3354.4 [I.V.:2054.4; Other:300; IV Piggyback:1000] Out: 640 [Urine:430; Emesis/NG output:210]  PHYSICAL EXAMINATION: General:  Sedated Neuro:  Opens eyes to pain and voice, moves all extremities HEENT:  PERRLA, trachea midline, ET tube in place Cardiovascular: Apical pulse regular, S1, S2 audible, no murmur reculture gallop Lungs:  Her to auscultation bilaterally Abdomen: Soft, nontender, positive bowel sounds, no focal Musculoskeletal:  No deformities, no joint swelling Skin:  Dry, no track marks, no rash  LABS:  BMET  Recent Labs Lab 08/22/16 1450 08/23/16 0501  NA 136 137  K 3.8 3.6  CL 105 111  CO2 20* 21*  BUN 11 8  CREATININE 0.95 0.78  GLUCOSE 231* 150*    Electrolytes  Recent Labs Lab 08/22/16 1450 08/23/16 0501  CALCIUM 9.5 8.2*    CBC  Recent Labs Lab 08/22/16 1450  WBC 10.4  HGB 14.3  HCT  41.1  PLT 228    Coag's No results for input(s): APTT, INR in the last 168 hours.  Sepsis Markers  Recent Labs Lab 08/22/16 1510 08/22/16 1710  LATICACIDVEN 2.1* 1.6    ABG  Recent Labs Lab 08/22/16 1554  PHART 7.38  PCO2ART 35  PO2ART 179*    Liver Enzymes No results for input(s): AST, ALT, ALKPHOS, BILITOT, ALBUMIN in the last 168 hours.  Cardiac Enzymes  Recent Labs Lab 08/22/16 1710 08/22/16 2259 08/23/16 0501  TROPONINI 0.03* 0.18* 0.15*    Glucose  Recent Labs Lab  08/22/16 1439 08/22/16 1706 08/22/16 2112 08/22/16 2349 08/23/16 0356 08/23/16 0735  GLUCAP 232* 132* 135* 120* 172* 148*    Imaging Dg Abd 1 View  Result Date: 08/23/2016 CLINICAL DATA:  Unresponsive.  Vomiting and diarrhea. EXAM: ABDOMEN - 1 VIEW COMPARISON:  Abdominal radiograph August 22, 2016 FINDINGS: Nasogastric tube tip projects in distal stomach, similarly position. Paucity of bowel gas. No intra-abdominal mass effect or pathologic calcifications. Tubal ligation clips project in the pelvis. Phleboliths project in the pelvis. Soft tissue planes and included osseous structures are nonsuspicious. IMPRESSION: Nasogastric tube tip projects in distal stomach. Nonspecific bowel gas pattern. Electronically Signed   By: Elon Alas M.D.   On: 08/23/2016 01:56   Ct Head Wo Contrast  Result Date: 08/22/2016 CLINICAL DATA:  Unresponsiveness.  Fall from bed. EXAM: CT HEAD WITHOUT CONTRAST TECHNIQUE: Contiguous axial images were obtained from the base of the skull through the vertex without intravenous contrast. COMPARISON:  08/03/2016 head CT. FINDINGS: Brain: No evidence of parenchymal hemorrhage or extra-axial fluid collection. No mass lesion, mass effect, or midline shift. No CT evidence of acute infarction. Cerebral volume is age appropriate. No ventriculomegaly. Vascular: No hyperdense vessel or unexpected calcification. Skull: No evidence of calvarial fracture. Sinuses/Orbits: The visualized paranasal sinuses are essentially clear. Other:  The mastoid air cells are unopacified. IMPRESSION: Negative head CT.  No evidence of acute intracranial abnormality. Electronically Signed   By: Ilona Sorrel M.D.   On: 08/22/2016 15:19   Dg Chest Portable 1 View  Result Date: 08/22/2016 CLINICAL DATA:  Intubation EXAM: PORTABLE CHEST 1 VIEW COMPARISON:  None. FINDINGS: Endotracheal tube is 3 cm above the carina. NG tube is in the stomach. There is bibasilar atelectasis. Heart is normal size. IMPRESSION:  Endotracheal tube 3 cm above the carina. Bibasilar atelectasis. Electronically Signed   By: Rolm Baptise M.D.   On: 08/22/2016 16:50   Dg Abd Portable 1 View  Result Date: 08/22/2016 CLINICAL DATA:  OG tube placement EXAM: PORTABLE ABDOMEN - 1 VIEW COMPARISON:  None. FINDINGS: OG tube tip is in the distal stomach. Nonobstructive bowel gas pattern. Bilateral tubal ligation clips noted. IMPRESSION: OG tube tip in the distal stomach. Electronically Signed   By: Rolm Baptise M.D.   On: 08/22/2016 16:51     STUDIES:  None  CULTURES: None  ANTIBIOTICS: None  SIGNIFICANT EVENTS: 08/22/2016: ED with acute change in mental status, admitted for possible overdose, IVC by the [facility] police  LINES/TUBES: Peripheral IVs  DISCUSSION: 46 year old Caucasian female presenting with acute encephalopathy secondary to possible overdose  ASSESSMENT  Possible overdose-intentional versus unintentional, toxicology positive for benzodiazepine and tricyclics for which patient has a prescription per her significant other. Acute encephalopathy. Acute respiratory failure secondary to severe encephalopathy. Mild Lactic acidosis History of type 2 diabetes  PLAN  Full vent support with current settings ABG and chest x-ray when necessary Bronchodilators IV fluids Hemodynamics per ICU protocol  Propofol and Versed for vent sedation Blood glucose monitoring with sliding-scale insulin coverage. GI and DVT prophylaxis   Further changes in treatment plan pending patient's clinical course and diagnostics FAMILY  - Updates: Significant other. Updated on current treatment plan at bedside. All questions answered. Support provided  - Inter-disciplinary family meet or Palliative Care meeting due by:  day 7    [facility] ANP-BC Pulmonary and Critical Care Medicine [facility] Pager [phone] or [phone]  08/23/2016, 8:46 AM    Merton Border, MD PCCM service Mobile  [phone] Pager [phone] 08/23/2016 12:45 PM

## 2016-08-23 NOTE — Progress Notes (Signed)
[place] at [facility]                                                                                                                                                                                  Patient Demographics   Alejandra Frederick, is a 46 y.o. female, DOB - [birth year], MOQ:[id number]  Admit date - 08/22/2016   Admitting Physician Fritzi Mandes, MD  Outpatient Primary MD for the patient is Patient, No Pcp Per   LOS - 1  Subjective: Pt extubated  She states that she took her medications as usual didn't take anything extra Complains of headache    Review of Systems:   CONSTITUTIONAL: No documented fever. No fatigue, weakness. No weight gain, no weight loss.  EYES: No blurry or double vision.  ENT: No tinnitus. No postnasal drip. No redness of the oropharynx.  RESPIRATORY: No cough, no wheeze, no hemoptysis. No dyspnea.  CARDIOVASCULAR: No chest pain. No orthopnea. No palpitations. No syncope.  GASTROINTESTINAL: No nausea, no vomiting or diarrhea. No abdominal pain. No melena or hematochezia.  GENITOURINARY: No dysuria or hematuria.  ENDOCRINE: No polyuria or nocturia. No heat or cold intolerance.  HEMATOLOGY: No anemia. No bruising. No bleeding.  INTEGUMENTARY: No rashes. No lesions.  MUSCULOSKELETAL: No arthritis. No swelling. No gout.  NEUROLOGIC:Positive headache PSYCHIATRIC: No anxiety. No insomnia. No ADD.    Vitals:   Vitals:   08/23/16 0800 08/23/16 1100 08/23/16 1200 08/23/16 1300  BP: 111/82 100/67 102/69 101/69  Pulse: 93 88 91 97  Resp: (!) 21 (!) 29 (!) 21 15  Temp: 98.4 F (36.9 C)  99.2 F (37.3 C)   TempSrc: Oral  Oral   SpO2: 100% 95% 98% 96%  Weight:      Height:        Wt Readings from Last 3 Encounters:  08/22/16 176 lb 5.9 oz (80 kg)  08/02/16 167 lb (75.8 kg)     Intake/Output Summary (Last 24 hours) at 08/23/16 1507 Last data filed at 08/23/16 1300  Gross per 24 hour  Intake          3604.43 ml  Output              1290 ml  Net          2314.43 ml    Physical Exam:   GENERAL: Pleasant-appearing in no apparent distress.  HEAD, EYES, EARS, NOSE AND THROAT: Atraumatic, normocephalic. Extraocular muscles are intact. Pupils equal and reactive to light. Sclerae anicteric. No conjunctival injection. No oro-pharyngeal erythema.  NECK: Supple. There is no jugular venous distention. No bruits, no lymphadenopathy, no thyromegaly.  HEART: Regular rate and rhythm,. No  murmurs, no rubs, no clicks.  LUNGS: Clear to auscultation bilaterally. No rales or rhonchi. No wheezes.  ABDOMEN: Soft, flat, nontender, nondistended. Has good bowel sounds. No hepatosplenomegaly appreciated.  EXTREMITIES: No evidence of any cyanosis, clubbing, or peripheral edema.  +2 pedal and radial pulses bilaterally.  NEUROLOGIC: The patient is alert, awake, and oriented x3 with no focal motor or sensory deficits appreciated bilaterally.  SKIN: Moist and warm with no rashes appreciated.  Psych: Not anxious, depressed LN: No inguinal LN enlargement    Antibiotics   Anti-infectives    None      Medications   Scheduled Meds: . enoxaparin (LOVENOX) injection  40 mg Subcutaneous Q24H  . insulin aspart  0-15 Units Subcutaneous TID WC  . insulin aspart  0-5 Units Subcutaneous QHS   Continuous Infusions: PRN Meds:.acetaminophen **OR** [DISCONTINUED] acetaminophen, [DISCONTINUED] ondansetron **OR** ondansetron (ZOFRAN) IV, phenol-menthol   Data Review:   Micro Results Recent Results (from the past 240 hour(s))  MRSA PCR Screening     Status: None   Collection Time: 08/22/16  5:03 PM  Result Value Ref Range Status   MRSA by PCR NEGATIVE NEGATIVE Final    Comment:        The GeneXpert MRSA Assay (FDA approved for NASAL specimens only), is one component of a comprehensive MRSA colonization surveillance program. It is not intended to diagnose MRSA infection nor to guide or monitor treatment for MRSA infections.      Radiology Reports Dg Abd 1 View  Result Date: 08/23/2016 CLINICAL DATA:  Unresponsive.  Vomiting and diarrhea. EXAM: ABDOMEN - 1 VIEW COMPARISON:  Abdominal radiograph August 22, 2016 FINDINGS: Nasogastric tube tip projects in distal stomach, similarly position. Paucity of bowel gas. No intra-abdominal mass effect or pathologic calcifications. Tubal ligation clips project in the pelvis. Phleboliths project in the pelvis. Soft tissue planes and included osseous structures are nonsuspicious. IMPRESSION: Nasogastric tube tip projects in distal stomach. Nonspecific bowel gas pattern. Electronically Signed   By: Elon Alas M.D.   On: 08/23/2016 01:56   Ct Head Wo Contrast  Result Date: 08/22/2016 CLINICAL DATA:  Unresponsiveness.  Fall from bed. EXAM: CT HEAD WITHOUT CONTRAST TECHNIQUE: Contiguous axial images were obtained from the base of the skull through the vertex without intravenous contrast. COMPARISON:  08/03/2016 head CT. FINDINGS: Brain: No evidence of parenchymal hemorrhage or extra-axial fluid collection. No mass lesion, mass effect, or midline shift. No CT evidence of acute infarction. Cerebral volume is age appropriate. No ventriculomegaly. Vascular: No hyperdense vessel or unexpected calcification. Skull: No evidence of calvarial fracture. Sinuses/Orbits: The visualized paranasal sinuses are essentially clear. Other:  The mastoid air cells are unopacified. IMPRESSION: Negative head CT.  No evidence of acute intracranial abnormality. Electronically Signed   By: Ilona Sorrel M.D.   On: 08/22/2016 15:19   Ct Head Wo Contrast  Result Date: 08/03/2016 CLINICAL DATA:  Hyperglycemia tonight.  Difficulty speaking. EXAM: CT HEAD WITHOUT CONTRAST TECHNIQUE: Contiguous axial images were obtained from the base of the skull through the vertex without intravenous contrast. COMPARISON:  None. FINDINGS: Brain: There is no intracranial hemorrhage, mass or evidence of acute infarction. There is no  extra-axial fluid collection. Gray matter and white matter appear normal. Cerebral volume is normal for age. Brainstem and posterior fossa are unremarkable. The CSF spaces appear normal. Vascular: No hyperdense vessel or unexpected calcification. Skull: Normal. Negative for fracture or focal lesion. Sinuses/Orbits: No acute finding. Other: None. IMPRESSION: Normal brain Electronically Signed   By: Shaune Pascal  Alroy Dust M.D.   On: 08/03/2016 00:34   Dg Chest Portable 1 View  Result Date: 08/22/2016 CLINICAL DATA:  Intubation EXAM: PORTABLE CHEST 1 VIEW COMPARISON:  None. FINDINGS: Endotracheal tube is 3 cm above the carina. NG tube is in the stomach. There is bibasilar atelectasis. Heart is normal size. IMPRESSION: Endotracheal tube 3 cm above the carina. Bibasilar atelectasis. Electronically Signed   By: Rolm Baptise M.D.   On: 08/22/2016 16:50   Dg Abd Portable 1 View  Result Date: 08/22/2016 CLINICAL DATA:  OG tube placement EXAM: PORTABLE ABDOMEN - 1 VIEW COMPARISON:  None. FINDINGS: OG tube tip is in the distal stomach. Nonobstructive bowel gas pattern. Bilateral tubal ligation clips noted. IMPRESSION: OG tube tip in the distal stomach. Electronically Signed   By: Rolm Baptise M.D.   On: 08/22/2016 16:51     CBC  Recent Labs Lab 08/22/16 1450  WBC 10.4  HGB 14.3  HCT 41.1  PLT 228  MCV 92.1  MCH 32.0  MCHC 34.7  RDW 14.4    Chemistries   Recent Labs Lab 08/22/16 1450 08/23/16 0501  NA 136 137  K 3.8 3.6  CL 105 111  CO2 20* 21*  GLUCOSE 231* 150*  BUN 11 8  CREATININE 0.95 0.78  CALCIUM 9.5 8.2*   ------------------------------------------------------------------------------------------------------------------ estimated creatinine clearance is 95.7 mL/min (by C-G formula based on SCr of 0.78 mg/dL). ------------------------------------------------------------------------------------------------------------------ No results for input(s): HGBA1C in the last 72  hours. ------------------------------------------------------------------------------------------------------------------  Recent Labs  08/22/16 1710  TRIG 551*   ------------------------------------------------------------------------------------------------------------------ No results for input(s): TSH, T4TOTAL, T3FREE, THYROIDAB in the last 72 hours.  Invalid input(s): FREET3 ------------------------------------------------------------------------------------------------------------------ No results for input(s): VITAMINB12, FOLATE, FERRITIN, TIBC, IRON, RETICCTPCT in the last 72 hours.  Coagulation profile No results for input(s): INR, PROTIME in the last 168 hours.  No results for input(s): DDIMER in the last 72 hours.  Cardiac Enzymes  Recent Labs Lab 08/22/16 1710 08/22/16 2259 08/23/16 0501  TROPONINI 0.03* 0.18* 0.15*   ------------------------------------------------------------------------------------------------------------------ Invalid input(s): POCBNP    Assessment & Plan  Alejandra Frederick  is a 46 y.o. female with a known history of Diabetes and history of polysubstance abuse comes to the emergency room after she had some vomiting and diarrhea. She also had some decreased appetite and headache. According to the staff has been told patient took some Klonopin before she came to the emergency room. Not sure if this is her prescription medication.  1. Acute encephalopathy/altered mental status/unresponsive -Possibly due to overdose toxicology positive for benzodiazepines and tricyclics Episode appears to be acute now resolved  2. Elevated lactic acid -Continue to follow after IV fluids no evidence of infectious  3. Diabetes -Sliding-scale insulin Check a hemoglobin A1c  4. DVT prophylaxis subcutaneous Lovenox         Code Status Orders        Start     Ordered   08/22/16 1701  Full code  Continuous     08/22/16 1700    Code Status  History    Date Active Date Inactive Code Status Order ID Comments User Context   This patient has a current code status but no historical code status.           Consults  Pulmonary critical   DVT Prophylaxis  Lovenox   Lab Results  Component Value Date   PLT 228 08/22/2016     Time Spent in minutes  35 minutes  Greater than 50% of time spent in care  coordination and counseling patient regarding the condition and plan of care.   Dustin Flock M.D on 08/23/2016 at 3:07 PM  Between 7am to 6pm - Pager - [phone]  After 6pm go to [email or web address] - password [email or web address] [place] [facility]   Office  [phone]

## 2016-08-24 ENCOUNTER — Encounter: Payer: Self-pay | Admitting: Cardiology

## 2016-08-24 DIAGNOSIS — T50901D Poisoning by unspecified drugs, medicaments and biological substances, accidental (unintentional), subsequent encounter: Secondary | ICD-10-CM

## 2016-08-24 DIAGNOSIS — F3160 Bipolar disorder, current episode mixed, unspecified: Secondary | ICD-10-CM

## 2016-08-24 LAB — GLUCOSE, CAPILLARY
GLUCOSE-CAPILLARY: 178 mg/dL — AB (ref 65–99)
GLUCOSE-CAPILLARY: 183 mg/dL — AB (ref 65–99)
GLUCOSE-CAPILLARY: 151 mg/dL — AB (ref 65–99)
GLUCOSE-CAPILLARY: 138 mg/dL — AB (ref 65–99)

## 2016-08-24 NOTE — Discharge Instructions (Signed)
[place] at [facility]:  Diabetic diet  DISCHARGE CONDITION:  Stable  ACTIVITY:  Activity as tolerated  OXYGEN:  Home Oxygen: No.   Oxygen Delivery: room air  DISCHARGE LOCATION:  home    ADDITIONAL DISCHARGE INSTRUCTION:Resume all medications as taking previously   If you experience worsening of your admission symptoms, develop shortness of breath, life threatening emergency, suicidal or homicidal thoughts you must seek medical attention immediately by calling 911 or calling your MD immediately  if symptoms less severe.  You Must read complete instructions/literature along with all the possible adverse reactions/side effects for all the Medicines you take and that have been prescribed to you. Take any new Medicines after you have completely understood and accpet all the possible adverse reactions/side effects.   Please note  You were cared for by a hospitalist during your hospital stay. If you have any questions about your discharge medications or the care you received while you were in the hospital after you are discharged, you can call the unit and asked to speak with the hospitalist on call if the hospitalist that took care of you is not available. Once you are discharged, your primary care physician will handle any further medical issues. Please note that NO REFILLS for any discharge medications will be authorized once you are discharged, as it is imperative that you return to your primary care physician (or establish a relationship with a primary care physician if you do not have one) for your aftercare needs so that they can reassess your need for medications and monitor your lab values.

## 2016-08-24 NOTE — Care Management Important Message (Signed)
Important Message  Patient Details  Name: Alejandra Frederick MRN: [id number] Date of Birth: Oct 30, 1970   Medicare Important Message Given:  N/A - LOS <3 / Initial given by admissions    Jolly Mango, RN 08/24/2016, 2:19 PM

## 2016-08-24 NOTE — Consult Note (Signed)
[facility] Consult   Reason for Consult:  Consult for 46 year old woman in the hospital after coming in with possible overdose. Referring Physician:  Posey Pronto Patient Identification: Alejandra Frederick MRN:  [id number] Principal Diagnosis: Accidental drug overdose Diagnosis:   Patient Active Problem List   Diagnosis Date Noted  . Bipolar I disorder, most recent episode mixed ([place]) [F31.60] 08/24/2016  . Acute respiratory failure with hypoxia ([place]) [J96.01]   . Accidental drug overdose [T50.901A]   . Encephalopathy acute [G93.40] 08/22/2016    Total Time spent with patient: 1 hour  Subjective:   Alejandra Frederick is a 46 y.o. female patient admitted with "I had bad vertigo".  HPI:  Patient interviewed chart reviewed. Also spoke with her boyfriend who was in the room. 42 year old woman came into the hospital with altered mental status somewhat unarousable. Briefly had to be intubated. Only partial response to Narcan. Today she is extubated and appears to be back to her usual mental state. Patient says that she recalls an episode a couple days prior to admission when she had gone swimming and thought that she had possibly inhale water into her lungs. The next day she was feeling unusually sickly and dizzy and had vertigo. She was brought to the hospital by family with altered mental status. No indication that she had intentionally taken an overdose. Patient has insisted that she took only her usually prescribed medicine. Boyfriend backs this up. There is no report of any intentional or accidental use of other drugs or overdose. Patient says her mood recently has been fairly good. Denies recent suicidal ideation at all. Has been sleeping reasonably well. Denies any recent psychotic symptoms. Says she is not abusing any drugs or alcohol.  Social history: Recently moved from [facility] to [facility]. Describes a long history of dysfunctional relationships. Seems to feel that her  current relationship is much better.  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 Does have a history of overuse of benzodiazepines and other prescription drugs.  Past Psychiatric History: Long history of mental health problems with a diagnosis of bipolar disorder and possibly schizoaffective disorder. Has had hospitalizations in the past and does have a past history of suicide attempts as well as accidental overdoses. She has been maintained on medications including Elavil, Depakote, gabapentin and clonazepam by an outpatient provider in [facility]. Her medications have recently been prescribed only 1 week at a time. Boyfriend reports that he keeps a close eye on all of her medicines.  Risk to Self: Is patient at risk for suicide?: No Risk to Others:   Prior Inpatient Therapy:   Prior Outpatient Therapy:    Past Medical History:  Past Medical History:  Diagnosis Date  . Diabetes mellitus without complication ([place])    History reviewed. No pertinent surgical history. Family History: History reviewed. No pertinent family history. Family Psychiatric  History: Positive for bipolar disorder and her mother died of suicide Social History:  History  Alcohol use Not on file     History  Drug Use    Social History   Social History  . Marital status: Significant Other    Spouse name: N/A  . Number of children: N/A  . Years of education: N/A   Social History Main Topics  . Smoking status: Never Smoker  . Smokeless tobacco: Never Used  . Alcohol use None  .  Drug use: Yes  . Sexual activity: Not Asked   Other Topics Concern  . None   Social History Narrative  . None   Additional Social History:    Allergies:   Allergies  Allergen Reactions  . Ciprofloxacin Anaphylaxis    Labs:  Results for orders placed or performed  during the hospital encounter of 08/22/16 (from the past 48 hour(s))  Glucose, capillary     Status: Abnormal   Collection Time: 08/22/16  9:12 PM  Result Value Ref Range   Glucose-Capillary 135 (H) 65 - 99 mg/dL  Troponin I     Status: Abnormal   Collection Time: 08/22/16 10:59 PM  Result Value Ref Range   Troponin I 0.18 (HH) <0.03 ng/mL    Comment: CRITICAL RESULT CALLED TO, READ BACK BY AND VERIFIED WITH TONYA SILVA AT 2340 08/22/16.PMH  Glucose, capillary     Status: Abnormal   Collection Time: 08/22/16 11:49 PM  Result Value Ref Range   Glucose-Capillary 120 (H) 65 - 99 mg/dL  Glucose, capillary     Status: Abnormal   Collection Time: 08/23/16  3:56 AM  Result Value Ref Range   Glucose-Capillary 172 (H) 65 - 99 mg/dL  Basic metabolic panel     Status: Abnormal   Collection Time: 08/23/16  5:01 AM  Result Value Ref Range   Sodium 137 135 - 145 mmol/L   Potassium 3.6 3.5 - 5.1 mmol/L   Chloride 111 101 - 111 mmol/L   CO2 21 (L) 22 - 32 mmol/L   Glucose, Bld 150 (H) 65 - 99 mg/dL   BUN 8 6 - 20 mg/dL   Creatinine, Ser 0.78 0.44 - 1.00 mg/dL   Calcium 8.2 (L) 8.9 - 10.3 mg/dL   GFR calc non Af Amer >60 >60 mL/min   GFR calc Af Amer >60 >60 mL/min    Comment: (NOTE) The eGFR has been calculated using the CKD EPI equation. This calculation has not been validated in all clinical situations. eGFR's persistently <60 mL/min signify possible Chronic Kidney Disease.    Anion gap 5 5 - 15  Troponin I     Status: Abnormal   Collection Time: 08/23/16  5:01 AM  Result Value Ref Range   Troponin I 0.15 (HH) <0.03 ng/mL    Comment: CRITICAL VALUE NOTED. VALUE IS CONSISTENT WITH PREVIOUSLY REPORTED/CALLED VALUE.PMH  Glucose, capillary     Status: Abnormal   Collection Time: 08/23/16  7:35 AM  Result Value Ref Range   Glucose-Capillary 148 (H) 65 - 99 mg/dL  Glucose, capillary     Status: Abnormal   Collection Time: 08/23/16 11:50 AM  Result Value Ref Range   Glucose-Capillary 133  (H) 65 - 99 mg/dL  Glucose, capillary     Status: Abnormal   Collection Time: 08/23/16  1:46 PM  Result Value Ref Range   Glucose-Capillary 146 (H) 65 - 99 mg/dL  Glucose, capillary     Status: Abnormal   Collection Time: 08/23/16  4:19 PM  Result Value Ref Range   Glucose-Capillary 170 (H) 65 - 99 mg/dL  Glucose, capillary     Status: Abnormal   Collection Time: 08/23/16  9:44 PM  Result Value Ref Range   Glucose-Capillary 165 (H) 65 - 99 mg/dL  Glucose, capillary     Status: Abnormal   Collection Time: 08/24/16  1:24 AM  Result Value Ref Range   Glucose-Capillary 178 (H) 65 - 99 mg/dL   Comment 1 Notify RN   Glucose,  capillary     Status: Abnormal   Collection Time: 08/24/16  7:48 AM  Result Value Ref Range   Glucose-Capillary 183 (H) 65 - 99 mg/dL  Glucose, capillary     Status: Abnormal   Collection Time: 08/24/16 11:11 AM  Result Value Ref Range   Glucose-Capillary 151 (H) 65 - 99 mg/dL  Glucose, capillary     Status: Abnormal   Collection Time: 08/24/16  3:51 PM  Result Value Ref Range   Glucose-Capillary 138 (H) 65 - 99 mg/dL    Current Facility-Administered Medications  Medication Dose Route Frequency Provider Last Rate Last Dose  . acetaminophen (TYLENOL) tablet 650 mg  650 mg Oral Q6H PRN Fritzi Mandes, MD   650 mg at 08/23/16 1629  . enoxaparin (LOVENOX) injection 40 mg  40 mg Subcutaneous Q24H Fritzi Mandes, MD   40 mg at 08/23/16 2226  . ibuprofen (ADVIL,MOTRIN) tablet 400 mg  400 mg Oral Q6H PRN Dustin Flock, MD      . insulin aspart (novoLOG) injection 0-15 Units  0-15 Units Subcutaneous TID WC Wilhelmina Mcardle, MD   2 Units at 08/24/16 1655  . insulin aspart (novoLOG) injection 0-5 Units  0-5 Units Subcutaneous QHS Wilhelmina Mcardle, MD      . ondansetron [facility]) injection 4 mg  4 mg Intravenous Q6H PRN Fritzi Mandes, MD   4 mg at 08/23/16 0113  . phenol-menthol (CEPASTAT) lozenge 1 lozenge  1 lozenge Buccal PRN Mikael Spray, NP       Current Outpatient  Prescriptions  Medication Sig Dispense Refill  . amitriptyline (ELAVIL) 25 MG tablet Take 50 mg by mouth at bedtime.    . busPIRone (BUSPAR) 10 MG tablet Take 10 mg by mouth 2 (two) times daily.    . clonazePAM (KLONOPIN) 2 MG tablet Take 2 mg by mouth 2 (two) times daily. Takes 1 tablet po BID + 1/2 tablet HS (= 2.5 tablets daily)    . clonazePAM (KLONOPIN) 2 MG tablet Take 1 mg by mouth at bedtime. Takes 1 tablet po BID + 1/2 tablet HS (= 2.5 tablets daily)    . cyclobenzaprine (FLEXERIL) 5 MG tablet Take 5 mg by mouth at bedtime.    . divalproex (DEPAKOTE) 500 MG DR tablet Take 500 mg by mouth 2 (two) times daily.    Marland Kitchen gabapentin (NEURONTIN) 800 MG tablet Take 800 mg by mouth 2 (two) times daily.    . insulin degludec (TRESIBA FLEXTOUCH) 100 UNIT/ML SOPN FlexTouch Pen Inject 60 Units into the skin daily at 6 (six) AM.     . liraglutide (VICTOZA) 18 MG/3ML SOPN Inject 1.2 mg into the skin daily at 6 (six) AM.    . lurasidone (LATUDA) 80 MG TABS tablet Take 80 mg by mouth daily with breakfast.    . prazosin (MINIPRESS) 5 MG capsule Take 10 mg by mouth at bedtime.    . topiramate (TOPAMAX) 25 MG tablet Take 50 mg by mouth 2 (two) times daily.      Musculoskeletal: Strength & Muscle Tone: within normal limits Gait & Station: normal Patient leans: N/A  Psychiatric Specialty Exam: Physical Exam  Nursing note and vitals reviewed. Constitutional: She appears well-developed and well-nourished.  HENT:  Head: Normocephalic and atraumatic.  Eyes: Pupils are equal, round, and reactive to light. Conjunctivae are normal.  Neck: Normal range of motion.  Cardiovascular: Regular rhythm and normal heart sounds.   Respiratory: Effort normal. No respiratory distress.  GI: Soft.  Musculoskeletal: Normal range  of motion.  Neurological: She is alert.  Skin: Skin is warm and dry.  Psychiatric: She has a normal mood and affect. Her speech is normal and behavior is normal. Judgment and thought content  normal. Cognition and memory are normal.    Review of Systems  Constitutional: Negative.   HENT: Negative.   Eyes: Negative.   Respiratory: Negative.   Cardiovascular: Negative.   Gastrointestinal: Negative.   Musculoskeletal: Negative.   Skin: Negative.   Neurological: Negative.   Psychiatric/Behavioral: Negative.     Blood pressure 117/73, pulse 68, temperature 98.2 F (36.8 C), temperature source Oral, resp. rate 16, height '5\' 7"'  (1.702 m), weight 176 lb 5.9 oz (80 kg), last menstrual period 07/26/2016, SpO2 97 %.Body mass index is 27.62 kg/m.  General Appearance: Fairly Groomed  Eye Contact:  Good  Speech:  Clear and Coherent  Volume:  Normal  Mood:  Euthymic  Affect:  Congruent  Thought Process:  Goal Directed  Orientation:  Full (Time, Place, and Person)  Thought Content:  Logical  Suicidal Thoughts:  No  Homicidal Thoughts:  No  Memory:  Immediate;   Good Recent;   Poor Remote;   Fair  Judgement:  Fair  Insight:  Fair  Psychomotor Activity:  Decreased  Concentration:  Concentration: Fair  Recall:  [employer] of Knowledge:  Fair  Language:  Fair  Akathisia:  No  Handed:  Right  AIMS (if indicated):     Assets:  Desire for Improvement Financial Resources/Insurance Housing Physical Health Resilience Social Support  ADL's:  Intact  Cognition:  WNL  Sleep:        Treatment Plan Summary: Plan 46 year old woman came into the hospital with altered mental status. Drug screen was positive for benzodiazepines. I confirmed on the controlled substance database that she is prescribed a high dose of clonazepam although it looks like it is only dispensed a week at a time. She is on multiple other medicines that are potentially sedating. We discussed the dangers of interaction of all of those medicines. Patient has an appointment to see an outpatient psychiatrist at [facility] here in the community within the next week or 2. She will continue her current medication. Boyfriend  states he will continue to keep her medicine locked up so that she cannot overdose on it. Reviewed the risks of continued medication. Discontinue IVC. Patient can be released from the hospital and follow-up with outpatient provider  Disposition: No evidence of imminent risk to self or others at present.   Patient does not meet criteria for psychiatric inpatient admission. Supportive therapy provided about ongoing stressors.  Alethia Berthold, MD 08/24/2016 7:20 PM

## 2016-08-24 NOTE — Progress Notes (Signed)
Patient transferred to Room 153 at this time by NTs.  Significant other accompanying patient.  Patient belongings, medications and chart have been transported along with patient.  Report given to [place].

## 2016-08-24 NOTE — Progress Notes (Signed)
Patient alert and oriented. Denies pain. Denies suicidal ideation. Boyfriend and sitter at bedside. Vital signs stable. Patient reoriented to call bell system.   Deri Fuelling, RN

## 2016-08-24 NOTE — Progress Notes (Signed)
Pt arrived to room with sitter and significant other.

## 2016-08-24 NOTE — Progress Notes (Signed)
[place] [facility] to be D/C'd Home per MD order.  Discussed with the patient and all questions fully answered.  VSS, Skin clean, dry and intact without evidence of skin break down, no evidence of skin tears noted. IV catheter discontinued intact. Site without signs and symptoms of complications. Dressing and pressure applied.  An After Visit Summary was printed and given to the patient. Patient received prescription.  D/c education completed with patient/family including follow up instructions, medication list, d/c activities limitations if indicated, with other d/c instructions as indicated by MD - patient able to verbalize understanding, all questions fully answered.   Patient instructed to return to ED, call 911, or call MD for any changes in condition.   Patient escorted via [name], and D/C home via private auto.  Alejandra Frederick 08/24/2016 6:45 PM \

## 2016-08-25 LAB — HEMOGLOBIN A1C
HEMOGLOBIN A1C: 7.5 % — AB (ref 4.8–5.6)
MEAN PLASMA GLUCOSE: 169 mg/dL

## 2016-08-25 NOTE — Discharge Summary (Signed)
[place] at [facility], [place] y.o., DOB 1970-10-26, MRN [id number]. Admission date: 08/22/2016 Discharge Date 08/25/2016 Primary MD Patient, No Pcp Per Admitting Physician Fritzi Mandes, MD  Admission Diagnosis  Unresponsive [R41.89] Unresponsiveness [R41.89]  Discharge Diagnosis   Principal Problem: Acute respiratory failure with hypoxia   Accidental drug overdose    Encephalopathy acute   Acute respiratory failure with hypoxia (HCC)   Bipolar I disorder, most recent episode mixed [facility])    Diabetes type 2        Hospital Course patient is 46 year old with history of bipolar 1 disorder who is brought to the hospital with decrease in responsiveness the cause is not clear clear however possible overdose was suspected. However patient and her [place] denied any overdose. Patient needed to be intubated due to altered mental status. She was subsequently extubated. She was seen in consultation by psychiatry who recommended no inpatient psychiatric treatment she orally has outpatient psychiatry follow-up.             Consults  psychiatry  Significant Tests:  See full reports for all details     Dg Abd 1 View  Result Date: 08/23/2016 CLINICAL DATA:  Unresponsive.  Vomiting and diarrhea. EXAM: ABDOMEN - 1 VIEW COMPARISON:  Abdominal radiograph August 22, 2016 FINDINGS: Nasogastric tube tip projects in distal stomach, similarly position. Paucity of bowel gas. No intra-abdominal mass effect or pathologic calcifications. Tubal ligation clips project in the pelvis. Phleboliths project in the pelvis. Soft tissue planes and included osseous structures are nonsuspicious. IMPRESSION: Nasogastric tube tip projects in distal stomach. Nonspecific bowel gas pattern. Electronically Signed   By: Elon Alas M.D.   On: 08/23/2016 01:56   Ct Head Wo Contrast  Result Date: 08/22/2016 CLINICAL DATA:  Unresponsiveness.  Fall from bed. EXAM: CT HEAD WITHOUT  CONTRAST TECHNIQUE: Contiguous axial images were obtained from the base of the skull through the vertex without intravenous contrast. COMPARISON:  08/03/2016 head CT. FINDINGS: Brain: No evidence of parenchymal hemorrhage or extra-axial fluid collection. No mass lesion, mass effect, or midline shift. No CT evidence of acute infarction. Cerebral volume is age appropriate. No ventriculomegaly. Vascular: No hyperdense vessel or unexpected calcification. Skull: No evidence of calvarial fracture. Sinuses/Orbits: The visualized paranasal sinuses are essentially clear. Other:  The mastoid air cells are unopacified. IMPRESSION: Negative head CT.  No evidence of acute intracranial abnormality. Electronically Signed   By: Ilona Sorrel M.D.   On: 08/22/2016 15:19   Ct Head Wo Contrast  Result Date: 08/03/2016 CLINICAL DATA:  Hyperglycemia tonight.  Difficulty speaking. EXAM: CT HEAD WITHOUT CONTRAST TECHNIQUE: Contiguous axial images were obtained from the base of the skull through the vertex without intravenous contrast. COMPARISON:  None. FINDINGS: Brain: There is no intracranial hemorrhage, mass or evidence of acute infarction. There is no extra-axial fluid collection. Gray matter and white matter appear normal. Cerebral volume is normal for age. Brainstem and posterior fossa are unremarkable. The CSF spaces appear normal. Vascular: No hyperdense vessel or unexpected calcification. Skull: Normal. Negative for fracture or focal lesion. Sinuses/Orbits: No acute finding. Other: None. IMPRESSION: Normal brain Electronically Signed   By: Andreas Newport M.D.   On: 08/03/2016 00:34   Dg Chest Portable 1 View  Result Date: 08/22/2016 CLINICAL DATA:  Intubation EXAM: PORTABLE CHEST 1 VIEW COMPARISON:  None. FINDINGS: Endotracheal tube is 3 cm above the carina. NG tube is in the stomach. There is bibasilar atelectasis. Heart is normal size. IMPRESSION: Endotracheal tube  3 cm above the carina. Bibasilar atelectasis.  Electronically Signed   By: Rolm Baptise M.D.   On: 08/22/2016 16:50   Dg Abd Portable 1 View  Result Date: 08/22/2016 CLINICAL DATA:  OG tube placement EXAM: PORTABLE ABDOMEN - 1 VIEW COMPARISON:  None. FINDINGS: OG tube tip is in the distal stomach. Nonobstructive bowel gas pattern. Bilateral tubal ligation clips noted. IMPRESSION: OG tube tip in the distal stomach. Electronically Signed   By: Rolm Baptise M.D.   On: 08/22/2016 16:51       Today   Subjective:   Alejandra Frederick  currently asymptomatic denies any chest pain or shortness of breath most go home  Objective:   Blood pressure 117/73, pulse 68, temperature 98.2 F (36.8 C), temperature source Oral, resp. rate 16, height 5\' 7"  (1.702 m), weight 176 lb 5.9 oz (80 kg), last menstrual period 07/26/2016, SpO2 97 %.  . No intake or output data in the 24 hours ending 08/25/16 1538  Exam VITAL SIGNS: Blood pressure 117/73, pulse 68, temperature 98.2 F (36.8 C), temperature source Oral, resp. rate 16, height 5\' 7"  (1.702 m), weight 176 lb 5.9 oz (80 kg), last menstrual period 07/26/2016, SpO2 97 %.  GENERAL:  46 y.o.-year-old patient lying in the bed with no acute distress.  EYES: Pupils equal, round, reactive to light and accommodation. No scleral icterus. Extraocular muscles intact.  HEENT: Head atraumatic, normocephalic. Oropharynx and nasopharynx clear.  NECK:  Supple, no jugular venous distention. No thyroid enlargement, no tenderness.  LUNGS: Normal breath sounds bilaterally, no wheezing, rales,rhonchi or crepitation. No use of accessory muscles of respiration.  CARDIOVASCULAR: S1, S2 normal. No murmurs, rubs, or gallops.  ABDOMEN: Soft, nontender, nondistended. Bowel sounds present. No organomegaly or mass.  EXTREMITIES: No pedal edema, cyanosis, or clubbing.  NEUROLOGIC: Cranial nerves II through XII are intact. Muscle strength 5/5 in all extremities. Sensation intact. Gait not checked.  PSYCHIATRIC: The patient is alert  and oriented x 3.  SKIN: No obvious rash, lesion, or ulcer.   Data Review     CBC w Diff:  Lab Results  Component Value Date   WBC 10.4 08/22/2016   HGB 14.3 08/22/2016   HCT 41.1 08/22/2016   PLT 228 08/22/2016   LYMPHOPCT 37 08/03/2016   MONOPCT 6 08/03/2016   EOSPCT 3 08/03/2016   BASOPCT 1 08/03/2016   CMP:  Lab Results  Component Value Date   NA 137 08/23/2016   K 3.6 08/23/2016   CL 111 08/23/2016   CO2 21 (L) 08/23/2016   BUN 8 08/23/2016   CREATININE 0.78 08/23/2016   PROT 8.4 (H) 08/03/2016   ALBUMIN 4.5 08/03/2016   BILITOT 1.0 08/03/2016   ALKPHOS 83 08/03/2016   AST 36 08/03/2016   ALT 19 08/03/2016  .  Micro Results Recent Results (from the past 240 hour(s))  MRSA PCR Screening     Status: None   Collection Time: 08/22/16  5:03 PM  Result Value Ref Range Status   MRSA by PCR NEGATIVE NEGATIVE Final    Comment:        The GeneXpert MRSA Assay (FDA approved for NASAL specimens only), is one component of a comprehensive MRSA colonization surveillance program. It is not intended to diagnose MRSA infection nor to guide or monitor treatment for MRSA infections.      Code Status History    Date Active Date Inactive Code Status Order ID Comments User Context   08/22/2016  5:00 PM 08/24/2016  8:47  PM Full Code [id number]  Fritzi Mandes, MD Inpatient          Follow-up Information    Glendon Axe, MD On 08/27/2016.   Specialty:  Internal Medicine Why:  [facility] within the next 3 Days.  Open til 7pm   Any questions call Janett Billow @ [phone]    Your follow up with Dr. Candiss Norse is 09/09/2016 @ 10am Contact information: [place] [facility] [place] [address] [phone]           Discharge Medications   Allergies as of 08/24/2016      Reactions   Ciprofloxacin Anaphylaxis      Medication List    TAKE these medications   amitriptyline 25 MG tablet Commonly known as:  ELAVIL Take 50 mg by mouth at  bedtime.   busPIRone 10 MG tablet Commonly known as:  BUSPAR Take 10 mg by mouth 2 (two) times daily.   clonazePAM 2 MG tablet Commonly known as:  KLONOPIN Take 2 mg by mouth 2 (two) times daily. Takes 1 tablet po BID + 1/2 tablet HS (= 2.5 tablets daily)   clonazePAM 2 MG tablet Commonly known as:  KLONOPIN Take 1 mg by mouth at bedtime. Takes 1 tablet po BID + 1/2 tablet HS (= 2.5 tablets daily)   cyclobenzaprine 5 MG tablet Commonly known as:  FLEXERIL Take 5 mg by mouth at bedtime.   divalproex 500 MG DR tablet Commonly known as:  DEPAKOTE Take 500 mg by mouth 2 (two) times daily.   gabapentin 800 MG tablet Commonly known as:  NEURONTIN Take 800 mg by mouth 2 (two) times daily.   lurasidone 80 MG Tabs tablet Commonly known as:  LATUDA Take 80 mg by mouth daily with breakfast.   prazosin 5 MG capsule Commonly known as:  MINIPRESS Take 10 mg by mouth at bedtime.   topiramate 25 MG tablet Commonly known as:  TOPAMAX Take 50 mg by mouth 2 (two) times daily.   TRESIBA FLEXTOUCH 100 UNIT/ML Sopn FlexTouch Pen Generic drug:  insulin degludec Inject 60 Units into the skin daily at 6 (six) AM.   VICTOZA 18 MG/3ML Sopn Generic drug:  liraglutide Inject 1.2 mg into the skin daily at 6 (six) AM.          Total Time in preparing paper work, data evaluation and todays exam - 35 minutes  Dustin Flock M.D on 08/25/2016 at 3:38 PM  [facility]   Office  [phone]

## 2016-08-31 LAB — BLOOD GAS, ARTERIAL
Acid-base deficit: 3.8 mmol/L — ABNORMAL HIGH (ref 0.0–2.0)
Bicarbonate: 20.7 mmol/L (ref 20.0–28.0)
FIO2: 0.4
MECHVT: 450 mL
O2 Saturation: 99.6 %
PCO2 ART: 35 mmHg (ref 32.0–48.0)
PEEP: 5 cmH2O
PH ART: 7.38 (ref 7.350–7.450)
PO2 ART: 179 mmHg — AB (ref 83.0–108.0)
Patient temperature: 37
RATE: 16 resp/min

## 2016-08-31 LAB — BLOOD GAS, VENOUS
ACID-BASE DEFICIT: 5.4 mmol/L — AB (ref 0.0–2.0)
Bicarbonate: 21.6 mmol/L (ref 20.0–28.0)
O2 Saturation: 69.4 %
PCO2 VEN: 47 mmHg (ref 44.0–60.0)
PH VEN: 7.27 (ref 7.250–7.430)
PO2 VEN: 42 mmHg (ref 32.0–45.0)
Patient temperature: 37

## 2016-09-10 DIAGNOSIS — E785 Hyperlipidemia, unspecified: Secondary | ICD-10-CM | POA: Insufficient documentation

## 2016-09-10 DIAGNOSIS — E1169 Type 2 diabetes mellitus with other specified complication: Secondary | ICD-10-CM | POA: Insufficient documentation

## 2016-09-30 DIAGNOSIS — Z87828 Personal history of other (healed) physical injury and trauma: Secondary | ICD-10-CM | POA: Insufficient documentation

## 2016-10-15 ENCOUNTER — Emergency Department: Payer: Medicare Other

## 2016-10-15 ENCOUNTER — Emergency Department
Admission: EM | Admit: 2016-10-15 | Discharge: 2016-10-15 | Disposition: A | Discharge status: Home / Self Care | Payer: Medicare Other | Attending: Emergency Medicine | Admitting: Emergency Medicine

## 2016-10-15 DIAGNOSIS — Z79899 Other long term (current) drug therapy: Secondary | ICD-10-CM | POA: Diagnosis not present

## 2016-10-15 DIAGNOSIS — E119 Type 2 diabetes mellitus without complications: Secondary | ICD-10-CM | POA: Insufficient documentation

## 2016-10-15 DIAGNOSIS — Z8673 Personal history of transient ischemic attack (TIA), and cerebral infarction without residual deficits: Secondary | ICD-10-CM | POA: Insufficient documentation

## 2016-10-15 DIAGNOSIS — I10 Essential (primary) hypertension: Secondary | ICD-10-CM | POA: Diagnosis not present

## 2016-10-15 DIAGNOSIS — F17200 Nicotine dependence, unspecified, uncomplicated: Secondary | ICD-10-CM | POA: Insufficient documentation

## 2016-10-15 DIAGNOSIS — R079 Chest pain, unspecified: Secondary | ICD-10-CM

## 2016-10-15 DIAGNOSIS — Z794 Long term (current) use of insulin: Secondary | ICD-10-CM | POA: Diagnosis not present

## 2016-10-15 HISTORY — DX: Pure hypercholesterolemia, unspecified: E78.00

## 2016-10-15 HISTORY — DX: Acute myocardial infarction, unspecified: I21.9

## 2016-10-15 HISTORY — DX: Essential (primary) hypertension: I10

## 2016-10-15 HISTORY — DX: Cerebral infarction, unspecified: I63.9

## 2016-10-15 LAB — BASIC METABOLIC PANEL
Anion gap: 10 (ref 5–15)
BUN: 7 mg/dL (ref 6–20)
CHLORIDE: 103 mmol/L (ref 101–111)
CO2: 23 mmol/L (ref 22–32)
Calcium: 9.2 mg/dL (ref 8.9–10.3)
Creatinine, Ser: 0.97 mg/dL (ref 0.44–1.00)
GFR calc non Af Amer: >60 mL/min (ref >60)
Glucose, Bld: 149 mg/dL — ABNORMAL HIGH (ref 65–99)
POTASSIUM: 4.1 mmol/L (ref 3.5–5.1)
Sodium: 136 mmol/L (ref 135–145)

## 2016-10-15 LAB — CBC
HEMATOCRIT: 43 % (ref 35.0–47.0)
HEMOGLOBIN: 14.8 g/dL (ref 12.0–16.0)
MCH: 31.1 pg (ref 26.0–34.0)
MCHC: 34.4 g/dL (ref 32.0–36.0)
MCV: 90.6 fL (ref 80.0–100.0)
Platelets: 251 10*3/uL (ref 150–440)
RBC: 4.75 MIL/uL (ref 3.80–5.20)
RDW: 13.6 % (ref 11.5–14.5)
WBC: 13.4 10*3/uL — ABNORMAL HIGH (ref 3.6–11.0)

## 2016-10-15 LAB — GLUCOSE, CAPILLARY: Glucose-Capillary: 156 mg/dL — ABNORMAL HIGH (ref 65–99)

## 2016-10-15 LAB — TROPONIN I

## 2016-10-15 MED ORDER — SODIUM CHLORIDE 0.9 % IV BOLUS (SEPSIS)
1000.0000 mL | Freq: Once | INTRAVENOUS | Status: AC
  Administered 2016-10-15: 1000 mL via INTRAVENOUS

## 2016-10-15 MED ORDER — MORPHINE SULFATE (PF) 2 MG/ML IV SOLN
2.0000 mg | Freq: Once | INTRAVENOUS | Status: AC
  Administered 2016-10-15: 2 mg via INTRAVENOUS
  Filled 2016-10-15: qty 1

## 2016-10-15 MED ORDER — TRAMADOL HCL 50 MG PO TABS: 50.0000 mg | ORAL_TABLET | Freq: Four times a day (QID) | ORAL | 0 refills | Status: DC | PRN

## 2016-10-15 MED ORDER — IOPAMIDOL (ISOVUE-370) INJECTION 76%
75.0000 mL | Freq: Once | INTRAVENOUS | Status: AC | PRN
  Administered 2016-10-15: 75 mL via INTRAVENOUS
  Filled 2016-10-15: qty 75

## 2016-10-15 NOTE — ED Notes (Signed)
See triage note   Presents from [facility] with chest pain for about 1 week  States she has had cough and cold sx's prior  Unsure of fever  States pain is in left upper chest and worse with inspiration    Also states sh needs refill on meds

## 2016-10-15 NOTE — ED Triage Notes (Signed)
Left chest pain started a week ago and is sharp on the breast and radiates to the neck. Is sharp and also goes numb.

## 2016-10-15 NOTE — Discharge Instructions (Signed)
You have been seen in the emergency department today for chest pain. Your workup has shown normal results. As we discussed please follow-up with your primary care physician in the next 1-2 days for recheck. Return to the emergency department for any further chest pain, trouble breathing, or any other symptom personally concerning to yourself. °

## 2016-10-15 NOTE — ED Provider Notes (Signed)
[facility] Emergency Department Provider Note  Time seen: 2:28 PM  I have reviewed the triage vital signs and the nursing notes.   HISTORY  Chief Complaint Chest Pain    HPI Alejandra Frederick is a 46 y.o. female With a past medical history of diabetes, hypertension, hyperlipidemia, MI, CVA, presents to the emergency department for chest pain. According to the patient she has high cholesterol levels. She went to the [facility] today because they forgot to prescribe her atorvastatin. States while there she also mentioned she has been having chest pain and felt like her heart was beating fast for the past one week. She states they did an EKG and told her to come to the ER immediately. Patient denies any abdominal pain or diarrhea or dysuria. Does state intermittent nausea and vomiting but that has been ongoing for several weeks. States left-sided chest pain, moderate, sharp, worse with deep inspiration. Patient states she thinks she had a blood clot in her lungs about one year ago but refused blood thinners. Denies any leg pain or swelling.  Past Medical History:  Diagnosis Date  . Diabetes mellitus without complication ([place])   . High cholesterol   . Hypertension   . Myocardial infarction ([place]) 2016  . Stroke [facility]) 2013    Patient Active Problem List   Diagnosis Date Noted  . Bipolar I disorder, most recent episode mixed ([place]) 08/24/2016  . Acute respiratory failure with hypoxia ([place])   . Accidental drug overdose   . Encephalopathy acute 08/22/2016    Past Surgical History:  Procedure Laterality Date  . TONSILLECTOMY    . TUBAL LIGATION      Prior to Admission medications   Medication Sig Start Date End Date Taking? Authorizing Provider  amitriptyline (ELAVIL) 25 MG tablet Take 50 mg by mouth at bedtime.    [provider]  busPIRone (BUSPAR) 10 MG tablet Take 10 mg by mouth 2 (two) times daily.    [provider]  clonazePAM  (KLONOPIN) 2 MG tablet Take 2 mg by mouth 2 (two) times daily. Takes 1 tablet po BID + 1/2 tablet HS (= 2.5 tablets daily)    [provider]  clonazePAM (KLONOPIN) 2 MG tablet Take 1 mg by mouth at bedtime. Takes 1 tablet po BID + 1/2 tablet HS (= 2.5 tablets daily)    [provider]  cyclobenzaprine (FLEXERIL) 5 MG tablet Take 5 mg by mouth at bedtime.    [provider]  divalproex (DEPAKOTE) 500 MG DR tablet Take 500 mg by mouth 2 (two) times daily.    [provider]  gabapentin (NEURONTIN) 800 MG tablet Take 800 mg by mouth 2 (two) times daily.    [provider]  insulin degludec (TRESIBA FLEXTOUCH) 100 UNIT/ML SOPN FlexTouch Pen Inject 60 Units into the skin daily at 6 (six) AM.     [provider]  liraglutide (VICTOZA) 18 MG/3ML SOPN Inject 1.2 mg into the skin daily at 6 (six) AM.    [provider]  lurasidone (LATUDA) 80 MG TABS tablet Take 80 mg by mouth daily with breakfast.    [provider]  prazosin (MINIPRESS) 5 MG capsule Take 10 mg by mouth at bedtime.    [provider]  topiramate (TOPAMAX) 25 MG tablet Take 50 mg by mouth 2 (two) times daily.    [provider]    Allergies  Allergen Reactions  . Bee Venom Anaphylaxis  . Ciprofloxacin Anaphylaxis  .  Zofran [Ondansetron Hcl] Anaphylaxis    Swelling of the throat and redness of the face     No family history on file.  Social History Social History  Substance Use Topics  . Smoking status: Current Every Day Smoker    Packs/day: 0.50  . Smokeless tobacco: Never Used  . Alcohol use No    Review of Systems Constitutional: Negative for fever. Cardiovascular: positive for left chest pain worse with inspiration. Positive for fast heart rate Respiratory: Negative for shortness of breath. Gastrointestinal: Negative for abdominal pain. Intermittent nausea and vomiting. Genitourinary: Negative for dysuria. Musculoskeletal:  Negative for leg pain or swelling Neurological: Negative for headache All other ROS negative  ____________________________________________   PHYSICAL EXAM:  VITAL SIGNS: ED Triage Vitals  Enc Vitals Group     BP 10/15/16 1249 121/77     Pulse Rate 10/15/16 1249 (!) 116     Resp --      Temp 10/15/16 1249 98 F (36.7 C)     Temp Source 10/15/16 1249 Oral     SpO2 10/15/16 1239 99 %     Weight 10/15/16 1251 170 lb (77.1 kg)     Height 10/15/16 1251 5\' 2"  (1.575 m)     Head Circumference --      Peak Flow --      Pain Score 10/15/16 1248 9     Pain Loc --      Pain Edu? --      Excl. in [place]? --     Constitutional: Alert and oriented. Well appearing and in no distress. Eyes: Normal exam ENT   Head: Normocephalic and atraumatic   Mouth/Throat: Mucous membranes are moist. Cardiovascular: regular rhythm, rate around 120. No murmur. Respiratory: Normal respiratory effort without tachypnea nor retractions. Breath sounds are clear and equal bilaterally. No wheezes/rales/rhonchi.states chest pain with deep inspiration. Gastrointestinal: Soft and nontender. No distention.   Musculoskeletal: Nontender with normal range of motion in all extremities. No lower extremity tenderness or edema. Neurologic:  Normal speech and language. No gross focal neurologic deficits  Skin:  Skin is warm, dry and intact.  Psychiatric: Mood and affect are normal.   ____________________________________________    EKG  EKG reviewed and interpreted by myself shows sinus tachycardia 119 bpm, narrow QRS, normal axis, normal intervals, nonspecific ST changes. No ST elevation.  ____________________________________________    RADIOLOGY  chest x-ray negative CT angiography negative ____________________________________________   INITIAL IMPRESSION / ASSESSMENT AND PLAN / ED COURSE  Pertinent labs & imaging results that were available during my care of the patient were reviewed by me and  considered in my medical decision making (see chart for details).  patient presents to the emergency department for left-sided chest pain worse with inspiration [redacted] week along with tachycardia currently around 120 bpm. Nonspecific EKG changes. No ST elevation or significant depression. Given the patient's pleuritic chest pain with tachycardia we will begin IV, hydrate, treat pain and obtain a CT angiography to rule out pulmonary embolism. Reassuringly the patient's troponin is negative and the chest pain has been ongoing for 1 week per patient.  CT angiography is negative. We will discharge with a short course of Ultram and have the patient follow-up with her doctor. ____________________________________________   FINAL CLINICAL IMPRESSION(S) / ED DIAGNOSES  chest pain    Harvest Dark, MD 10/15/16 [phone]

## 2016-10-15 NOTE — ED Notes (Signed)
Pt standing outside in hospital gown smoking a cigarette walking up and down parking lot

## 2016-10-29 NOTE — Progress Notes (Signed)
11/02/2016 9:24 AM   Salem Senate 30-Dec-1970 [id number]  Referring provider: Judi Saa, MD [address] [place] [address]  Chief Complaint  Patient presents with  . New Patient (Initial Visit)    hematuria referred by Dr. Candiss Norse    HPI: Patient is a 46 -year-old Caucasian female who presents today as a referral from their PCP, Dr. Glendon Axe, for microscopic hematuria.    Patient was found to have microscopic hematuria on 2 occasions, 09/09/2016 and 09/30/2016 with 4-10 RBC's/hpf.  Patient states that her urine has a pink-tinged urine over the last three years.     She states that she has a prior history of recurrent urinary tract infections (patient states that she has had 7 UTI's over the last year, there is no documentation of positive urine cultures, but she moved from [facility] three months ago), she denies any history of nephrolithiasis, trauma to the genitourinary tract or malignancies of the genitourinary tract.   She does not have a family medical history of nephrolithiasis, malignancies of the genitourinary tract or hematuria.   Today, she is having symptoms of frequent urination x 15, urgency is strong, dysuria at the end of the stream, nocturia x 2, incontinence (SUI and UI, wears pads sometimes), hesitancy, intermittency, straining to urinate or a weak urinary stream.  She has been experiencing these symptoms for awhile.  Her UA today demonstrates no hematuria.  Her PVR is 75 mL.  She is experiencing suprapubic pain for awhile.  She denies any recent fevers, chills, nausea or vomiting.    She has diarrhea all the time.  This is been going on for a long time.    She did have a contrast CT at [facility] in May 2018 for nausea, vomiting and diarrhea.  Findings positive for minimal enterocolitis, with secondary ileus.No bowel obstruction.  Congenital uterine anomaly, favor septate uterus.  I do not have access to the images.  She is a  smoker.   She has not worked with Sports administrator, trichloroethylene, etc.   She drinks 2 bottles of water (16 oz and a cup of coffee in the am).   She is a diabetes.  Last HbgA1c was 7.3%.    PMH: Past Medical History:  Diagnosis Date  . Cancer ([place])    cervical ca  . Diabetes mellitus without complication ([place])   . High cholesterol   . Hypertension   . Myocardial infarction ([place]) 2016  . Stroke [facility]) 2013    Surgical History: Past Surgical History:  Procedure Laterality Date  . TONSILLECTOMY    . TUBAL LIGATION      Home Medications:  Allergies as of 11/02/2016      Reactions   Bee Venom Anaphylaxis   Ciprofloxacin Anaphylaxis   Zofran [ondansetron Hcl] Anaphylaxis   Swelling of the throat and redness of the face      Medication List       Accurate as of 11/02/16  9:24 AM. Always use your most recent med list.          amitriptyline 25 MG tablet Commonly known as:  ELAVIL Take 50 mg by mouth at bedtime.   busPIRone 10 MG tablet Commonly known as:  BUSPAR Take 10 mg by mouth 2 (two) times daily.   clonazePAM 2 MG tablet Commonly known as:  KLONOPIN Take 2 mg by mouth 2 (two) times daily. Takes 1 tablet po BID + 1/2 tablet HS (= 2.5 tablets daily)  clonazePAM 2 MG tablet Commonly known as:  KLONOPIN Take 1 mg by mouth at bedtime. Takes 1 tablet po BID + 1/2 tablet HS (= 2.5 tablets daily)   cyclobenzaprine 5 MG tablet Commonly known as:  FLEXERIL Take 5 mg by mouth at bedtime.   diclofenac sodium 1 % Gel Commonly known as:  VOLTAREN APPLY 2 GMS TO AFFECTED AREA FOUR TIMES DAILY   divalproex 500 MG DR tablet Commonly known as:  DEPAKOTE Take 500 mg by mouth 2 (two) times daily.   divalproex 500 MG 24 hr tablet Commonly known as:  DEPAKOTE ER Take 500 mg by mouth.   fenofibrate 48 MG tablet Commonly known as:  TRICOR Take by mouth.   FIFTY50 PEN NEEDLES 32G X 4 MM Misc Generic drug:  Insulin Pen Needle For use with insulin pen     gabapentin 800 MG tablet Commonly known as:  NEURONTIN Take 800 mg by mouth.   HYDROcodone-acetaminophen 5-325 MG tablet Commonly known as:  NORCO/VICODIN Take by mouth.   ibuprofen 800 MG tablet Commonly known as:  ADVIL,MOTRIN Take 800 mg by mouth.   insulin aspart 100 UNIT/ML injection Commonly known as:  novoLOG 150-200:    2 units,  201-250 :   4 units 251-300:    6 units 301- 350 :    8 units 351-400:    10units 401 :  10 units and call MD   lidocaine 2 % solution Commonly known as:  XYLOCAINE USE AS DIRECTED 1 ML BY MOUTH EVERY 3 HOURS AS NEEDED   lurasidone 80 MG Tabs tablet Commonly known as:  LATUDA Take by mouth.   metoprolol succinate 25 MG 24 hr tablet Commonly known as:  TOPROL-XL Take by mouth.   nortriptyline 10 MG capsule Commonly known as:  PAMELOR Take 10 mg by mouth at bedtime.   prazosin 5 MG capsule Commonly known as:  MINIPRESS Take 10 mg by mouth at bedtime.   prazosin 5 MG capsule Commonly known as:  MINIPRESS Take by mouth.   topiramate 25 MG tablet Commonly known as:  TOPAMAX Take 50 mg by mouth 2 (two) times daily.   traMADol 50 MG tablet Commonly known as:  ULTRAM Take 1 tablet (50 mg total) by mouth every 6 (six) hours as needed.   TRESIBA FLEXTOUCH 100 UNIT/ML Sopn FlexTouch Pen Generic drug:  insulin degludec Inject 60 Units into the skin daily at 6 (six) AM.   VICTOZA 18 MG/3ML Sopn Generic drug:  liraglutide Inject into the skin.            Discharge Care Instructions        Start     Ordered   11/02/16 0000  Urinalysis, Complete     11/02/16 0821   11/02/16 0000  CULTURE, URINE COMPREHENSIVE     11/02/16 0821   11/02/16 0000  BUN+Creat     11/02/16 0821   11/02/16 0000  BLADDER SCAN AMB NON-IMAGING     11/02/16 0915   11/02/16 0000  CT HEMATURIA WORKUP    Question Answer Comment  Reason for Exam (SYMPTOM  OR DIAGNOSIS REQUIRED) Microscopic hematuria   Preferred imaging location? [facility]   Is  the patient pregnant? No      11/02/16 0924      Allergies:  Allergies  Allergen Reactions  . Bee Venom Anaphylaxis  . Ciprofloxacin Anaphylaxis  . Zofran [Ondansetron Hcl] Anaphylaxis    Swelling of the throat and redness of the face  Family History: No family history on file.  Social History:  reports that she has been smoking.  She has been smoking about 0.50 packs per day. She has never used smokeless tobacco. She reports that she does not drink alcohol or use drugs.  ROS: UROLOGY Frequent Urination?: Yes Hard to postpone urination?: Yes Burning/pain with urination?: Yes Get up at night to urinate?: Yes Leakage of urine?: Yes Urine stream starts and stops?: Yes Trouble starting stream?: Yes Do you have to strain to urinate?: Yes Blood in urine?: Yes Urinary tract infection?: Yes Sexually transmitted disease?: No Injury to kidneys or bladder?: Yes Painful intercourse?: Yes Weak stream?: Yes Currently pregnant?: Yes Vaginal bleeding?: No Last menstrual period?: n  Gastrointestinal Nausea?: Yes Vomiting?: Yes Indigestion/heartburn?: Yes Diarrhea?: Yes Constipation?: No  Constitutional Fever: No Night sweats?: Yes Weight loss?: No Fatigue?: Yes  Skin Skin rash/lesions?: No Itching?: Yes  Eyes Blurred vision?: No Double vision?: No  Ears/Nose/Throat Sore throat?: No Sinus problems?: No  Hematologic/Lymphatic Swollen glands?: No Easy bruising?: Yes  Cardiovascular Leg swelling?: Yes Chest pain?: Yes  Respiratory Cough?: Yes Shortness of breath?: Yes  Endocrine Excessive thirst?: Yes  Musculoskeletal Back pain?: Yes Joint pain?: Yes  Neurological Headaches?: Yes Dizziness?: Yes  Psychologic Depression?: Yes Anxiety?: Yes  Physical Exam: BP 109/76   Pulse (!) 103   Ht 5\' 4"  (1.626 m)   Wt 171 lb (77.6 kg)   BMI 29.35 kg/m   Constitutional: Well nourished. Alert and oriented, No acute distress. HEENT: [place] AT, moist  mucus membranes. Trachea midline, no masses. Cardiovascular: No clubbing, cyanosis, or edema. Respiratory: Normal respiratory effort, no increased work of breathing. GI: Abdomen is soft, non tender, non distended, no abdominal masses. Liver and spleen not palpable.  No hernias appreciated.  Stool sample for occult testing is not indicated.   GU: No CVA tenderness.  No bladder fullness or masses.  Normal external genitalia, normal pubic hair distribution, no lesions.  Normal urethral meatus, no lesions, no prolapse, no discharge.   No urethral masses, tenderness and/or tenderness. No bladder fullness, tenderness or masses. Normal vagina mucosa, good estrogen effect, no discharge, no lesions, good pelvic support, Grade II cystocele or rectocele noted.  No cervical motion tenderness.  Uterus is freely mobile and non-fixed.  No adnexal/parametria masses or tenderness noted.  Anus and perineum are without rashes or lesions.    Skin: No rashes, bruises or suspicious lesions. Lymph: No cervical or inguinal adenopathy. Neurologic: Grossly intact, no focal deficits, moving all 4 extremities. Psychiatric: Normal mood and affect.  Laboratory Data: Lab Results  Component Value Date   WBC 13.4 (H) 10/15/2016   HGB 14.8 10/15/2016   HCT 43.0 10/15/2016   MCV 90.6 10/15/2016   PLT 251 10/15/2016    Lab Results  Component Value Date   CREATININE 0.97 10/15/2016    Lab Results  Component Value Date   HGBA1C 7.5 (H) 08/23/2016    Lab Results  Component Value Date   AST 36 08/03/2016   Lab Results  Component Value Date   ALT 19 08/03/2016    Urinalysis Unremarkable.  See EPIC.    I have reviewed the labs  Pertinent Imaging: PROCEDURE: CT ABDOMEN PELVIS W CONTRAST  CLINICAL HISTORY:Nausea, vomiting and diarrhea  COMPARISON:02/23/2016.  TECHNIQUE: 0.65 mm serial axial images were obtained with IV contrast.Multiplanar reconstructions performed.Automated exposure control  adjustment of mA and/or KV performed according to patient size.  FINDINGS:   CT ABDOMEN: Liver and spleen are normal.Normal gallbladder.No  biliary ductal dilatation.Pancreas is normal.Normal adrenals.Normal kidneys.Normal caliber visualized aorta, with no dissection.Lung bases are clear.No bulky retroperitoneal or mesenteric adenopathy.No ascites.Minimal distention of the colon with multiple air-fluid levels, compatible with ileus.Wall thickening and minimal edema involves the proximal ascending colon, representing minimal colitis.Minimal thickening of the proximal appendix, with normal caliber distally and no wall thickening or periappendiceal stranding.There are a few minimally prominent fluid-filled loops of proximal small bowel in the left upper quadrant.Findings therefore represent minimal enterocolitis with secondary ileus.No anatomic bowel obstruction.   CT PELVIS: Urinary bladder is normal.No bulky pelvic or inguinal lymphadenopathy.No pelvic free fluid. Heterogeneous uterus, with bilateral tubal ligation clips.Probable congenital uterine anomaly, favor a septate uterus over bicornuate uterus.Bones are unremarkable.   IMPRESSION:  1.Minimal enterocolitis, with secondary ileus.No bowel obstruction.  2.Congenital uterine anomaly, favor septate uterus.  Signed (Electronic Signature): 06/21/2016 8:21 AM Signed ZO:XWRU BIZZELL  Assessment & Plan:    1. Microscopic hematuria  - I explained to the patient that there are a number of causes that can be associated with blood in the urine, such as stones, UTI's, damage to the urinary tract and/or cancer.  - At this time, I felt that the patient warranted further urologic evaluation.   The AUA guidelines state that a CT urogram is the preferred imaging study to evaluate hematuria - patient's CT performed in May was only with contrast - I did explain that it can miss some urological tumors and  the recommendation would be to pursue a CTU at this time  - I explained to the patient that a contrast material will be injected into a vein and that in rare instances, an allergic reaction can result and may even life threatening   The patient denies any allergies to contrast, iodine and/or seafood and is not taking metformin.  - Her reproductive status is tubal ligation   - Following the imaging study,  I've recommended a cystoscopy. I described how this is performed, typically in an office setting with a flexible cystoscope. We described the risks, benefits, and possible side effects, the most common of which is a minor amount of blood in the urine and/or burning which usually resolves in 24 to 48 hours.    - The patient had the opportunity to ask questions which were answered. Based upon this discussion, the patient is willing to proceed. Therefore, I've ordered: a CT Urogram and cystoscopy.  - The patient will return following all of the above for discussion of the results.   - UA  - Urine culture   - BUN + creatinine     2. LU TS  - we will reassess if no etiology is found for her symptoms after the hematuria work up  - Did recommend patient that she increase her water intake to 1.5 L daily  3. Cystocele  - Explained to the patient how this is contributing to the leakage when she laughs coughs and sneezes  - We will reassess once hematuria workup is completed  Return for CT Urogram report and cystoscopy.  These notes generated with voice recognition software. I apologize for typographical errors.  Zara Council, [facility] [address] [place], [place] [address] [phone]

## 2016-11-02 ENCOUNTER — Encounter: Payer: Self-pay | Admitting: Urology

## 2016-11-02 ENCOUNTER — Ambulatory Visit (INDEPENDENT_AMBULATORY_CARE_PROVIDER_SITE_OTHER): Payer: Medicare Other | Admitting: Urology

## 2016-11-02 VITALS — BP 109/76 | HR 103 | Ht 64.0 in | Wt 171.0 lb

## 2016-11-02 DIAGNOSIS — R3129 Other microscopic hematuria: Secondary | ICD-10-CM | POA: Diagnosis not present

## 2016-11-02 DIAGNOSIS — R399 Unspecified symptoms and signs involving the genitourinary system: Secondary | ICD-10-CM

## 2016-11-02 DIAGNOSIS — N8111 Cystocele, midline: Secondary | ICD-10-CM | POA: Diagnosis not present

## 2016-11-02 LAB — URINALYSIS, COMPLETE
BILIRUBIN UA: NEGATIVE
Ketones, UA: NEGATIVE
Leukocytes, UA: NEGATIVE
Nitrite, UA: NEGATIVE
SPEC GRAV UA: 1.025 (ref 1.005–1.030)
UUROB: 0.2 mg/dL (ref 0.2–1.0)
pH, UA: 6 (ref 5.0–7.5)

## 2016-11-02 LAB — MICROSCOPIC EXAMINATION: Epithelial Cells (non renal): >10 /hpf — ABNORMAL HIGH (ref 0–10)

## 2016-11-02 LAB — BLADDER SCAN AMB NON-IMAGING: SCAN RESULT: 75

## 2016-11-03 LAB — BUN+CREAT
BUN / CREAT RATIO: 6 — AB (ref 9–23)
BUN: 6 mg/dL (ref 6–24)
CREATININE: 1.03 mg/dL — AB (ref 0.57–1.00)
GFR calc non Af Amer: 65 mL/min/{1.73_m2} (ref >59)
GFR, EST AFRICAN AMERICAN: 75 mL/min/{1.73_m2} (ref >59)

## 2016-11-03 LAB — HCG, SERUM, QUALITATIVE: hCG,Beta Subunit,Qual,Serum: NEGATIVE m[IU]/mL (ref <6)

## 2016-11-06 LAB — CULTURE, URINE COMPREHENSIVE

## 2016-11-10 ENCOUNTER — Ambulatory Visit: Admission: RE | Admit: 2016-11-10 | Payer: Medicare Other | Source: Ambulatory Visit

## 2016-11-24 ENCOUNTER — Other Ambulatory Visit: Payer: Self-pay | Admitting: Urology

## 2016-11-24 ENCOUNTER — Encounter: Payer: Self-pay | Admitting: Urology

## 2017-01-14 ENCOUNTER — Emergency Department: Payer: Medicare Other

## 2017-01-14 ENCOUNTER — Emergency Department
Admission: EM | Admit: 2017-01-14 | Discharge: 2017-01-14 | Disposition: A | Discharge status: Home / Self Care | Payer: Medicare Other | Attending: Emergency Medicine | Admitting: Emergency Medicine

## 2017-01-14 DIAGNOSIS — Z8673 Personal history of transient ischemic attack (TIA), and cerebral infarction without residual deficits: Secondary | ICD-10-CM | POA: Insufficient documentation

## 2017-01-14 DIAGNOSIS — Z791 Long term (current) use of non-steroidal anti-inflammatories (NSAID): Secondary | ICD-10-CM | POA: Diagnosis not present

## 2017-01-14 DIAGNOSIS — S60151A Contusion of right little finger with damage to nail, initial encounter: Secondary | ICD-10-CM | POA: Diagnosis not present

## 2017-01-14 DIAGNOSIS — Y999 Unspecified external cause status: Secondary | ICD-10-CM | POA: Diagnosis not present

## 2017-01-14 DIAGNOSIS — E119 Type 2 diabetes mellitus without complications: Secondary | ICD-10-CM | POA: Insufficient documentation

## 2017-01-14 DIAGNOSIS — Y939 Activity, unspecified: Secondary | ICD-10-CM | POA: Insufficient documentation

## 2017-01-14 DIAGNOSIS — S60221A Contusion of right hand, initial encounter: Secondary | ICD-10-CM

## 2017-01-14 DIAGNOSIS — I252 Old myocardial infarction: Secondary | ICD-10-CM | POA: Diagnosis not present

## 2017-01-14 DIAGNOSIS — Z79899 Other long term (current) drug therapy: Secondary | ICD-10-CM | POA: Diagnosis not present

## 2017-01-14 DIAGNOSIS — Y929 Unspecified place or not applicable: Secondary | ICD-10-CM | POA: Diagnosis not present

## 2017-01-14 DIAGNOSIS — I10 Essential (primary) hypertension: Secondary | ICD-10-CM | POA: Diagnosis not present

## 2017-01-14 DIAGNOSIS — F17200 Nicotine dependence, unspecified, uncomplicated: Secondary | ICD-10-CM | POA: Diagnosis not present

## 2017-01-14 DIAGNOSIS — S6010XA Contusion of unspecified finger with damage to nail, initial encounter: Secondary | ICD-10-CM

## 2017-01-14 DIAGNOSIS — Z794 Long term (current) use of insulin: Secondary | ICD-10-CM | POA: Insufficient documentation

## 2017-01-14 DIAGNOSIS — S6991XA Unspecified injury of right wrist, hand and finger(s), initial encounter: Secondary | ICD-10-CM | POA: Diagnosis present

## 2017-01-14 MED ORDER — LIDOCAINE HCL (PF) 1 % IJ SOLN
5.0000 mL | Freq: Once | INTRAMUSCULAR | Status: DC
  Filled 2017-01-14: qty 5
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
## 2017-01-14 NOTE — Discharge Instructions (Addendum)
Your have sustained a fingernail contusion. There is no fracture to the hand. Take OTC ibuprofen or Tylenol as needed. Apply ice to reduce pain and swelling. Wear the finger splint for protection.
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
## 2017-03-21 ENCOUNTER — Emergency Department
Admission: EM | Admit: 2017-03-21 | Discharge: 2017-03-21 | Disposition: A | Discharge status: Home / Self Care | Payer: Medicare Other | Attending: Emergency Medicine | Admitting: Emergency Medicine

## 2017-03-21 ENCOUNTER — Other Ambulatory Visit: Payer: Self-pay

## 2017-03-21 ENCOUNTER — Emergency Department: Payer: Medicare Other

## 2017-03-21 DIAGNOSIS — F17200 Nicotine dependence, unspecified, uncomplicated: Secondary | ICD-10-CM | POA: Insufficient documentation

## 2017-03-21 DIAGNOSIS — Z8541 Personal history of malignant neoplasm of cervix uteri: Secondary | ICD-10-CM | POA: Insufficient documentation

## 2017-03-21 DIAGNOSIS — Z79899 Other long term (current) drug therapy: Secondary | ICD-10-CM | POA: Insufficient documentation

## 2017-03-21 DIAGNOSIS — R4182 Altered mental status, unspecified: Secondary | ICD-10-CM | POA: Diagnosis not present

## 2017-03-21 DIAGNOSIS — I10 Essential (primary) hypertension: Secondary | ICD-10-CM | POA: Insufficient documentation

## 2017-03-21 DIAGNOSIS — F1910 Other psychoactive substance abuse, uncomplicated: Secondary | ICD-10-CM

## 2017-03-21 DIAGNOSIS — I252 Old myocardial infarction: Secondary | ICD-10-CM | POA: Diagnosis not present

## 2017-03-21 DIAGNOSIS — Z794 Long term (current) use of insulin: Secondary | ICD-10-CM | POA: Diagnosis not present

## 2017-03-21 DIAGNOSIS — E119 Type 2 diabetes mellitus without complications: Secondary | ICD-10-CM | POA: Diagnosis not present

## 2017-03-21 LAB — URINALYSIS, COMPLETE (UACMP) WITH MICROSCOPIC
BACTERIA UA: NONE SEEN
Bilirubin Urine: NEGATIVE
Glucose, UA: NEGATIVE mg/dL
Ketones, ur: NEGATIVE mg/dL
Leukocytes, UA: NEGATIVE
NITRITE: NEGATIVE
PROTEIN: NEGATIVE mg/dL
SPECIFIC GRAVITY, URINE: 1.018 (ref 1.005–1.030)
pH: 5 (ref 5.0–8.0)

## 2017-03-21 LAB — COMPREHENSIVE METABOLIC PANEL
ALT: 22 U/L (ref 14–54)
AST: 24 U/L (ref 15–41)
Albumin: 4.2 g/dL (ref 3.5–5.0)
Alkaline Phosphatase: 77 U/L (ref 38–126)
Anion gap: 10 (ref 5–15)
BUN: 9 mg/dL (ref 6–20)
CO2: 24 mmol/L (ref 22–32)
Calcium: 9.5 mg/dL (ref 8.9–10.3)
Chloride: 105 mmol/L (ref 101–111)
Creatinine, Ser: 0.9 mg/dL (ref 0.44–1.00)
GFR calc Af Amer: >60 mL/min (ref >60)
GFR calc non Af Amer: >60 mL/min (ref >60)
Glucose, Bld: 172 mg/dL — ABNORMAL HIGH (ref 65–99)
Potassium: 3.5 mmol/L (ref 3.5–5.1)
Sodium: 139 mmol/L (ref 135–145)
Total Bilirubin: 0.6 mg/dL (ref 0.3–1.2)
Total Protein: 7.6 g/dL (ref 6.5–8.1)

## 2017-03-21 LAB — CBC
HEMATOCRIT: 40.5 % (ref 35.0–47.0)
HEMOGLOBIN: 13.7 g/dL (ref 12.0–16.0)
MCH: 30.6 pg (ref 26.0–34.0)
MCHC: 33.9 g/dL (ref 32.0–36.0)
MCV: 90.3 fL (ref 80.0–100.0)
Platelets: 212 10*3/uL (ref 150–440)
RBC: 4.49 MIL/uL (ref 3.80–5.20)
RDW: 13.3 % (ref 11.5–14.5)
WBC: 10.3 10*3/uL (ref 3.6–11.0)

## 2017-03-21 LAB — ETHANOL: Alcohol, Ethyl (B): <10 mg/dL (ref <10)

## 2017-03-21 LAB — URINE DRUG SCREEN, QUALITATIVE (ARMC ONLY)
AMPHETAMINES, UR SCREEN: NOT DETECTED
Barbiturates, Ur Screen: NOT DETECTED
Benzodiazepine, Ur Scrn: POSITIVE — AB
COCAINE METABOLITE, UR ~~LOC~~: NOT DETECTED
Cannabinoid 50 Ng, Ur ~~LOC~~: NOT DETECTED
MDMA (ECSTASY) UR SCREEN: POSITIVE — AB
METHADONE SCREEN, URINE: NOT DETECTED
Opiate, Ur Screen: NOT DETECTED
Phencyclidine (PCP) Ur S: NOT DETECTED
Tricyclic, Ur Screen: POSITIVE — AB

## 2017-03-21 LAB — LACTIC ACID, PLASMA: Lactic Acid, Venous: 1.5 mmol/L (ref 0.5–1.9)

## 2017-03-21 LAB — AMMONIA: Ammonia: 14 umol/L (ref 9–35)

## 2017-03-21 LAB — TROPONIN I: Troponin I: <0.03 ng/mL (ref <0.03)

## 2017-03-21 LAB — VALPROIC ACID LEVEL: Valproic Acid Lvl: <10 ug/mL — ABNORMAL LOW (ref 50.0–100.0)

## 2017-03-21 LAB — LIPASE, BLOOD: Lipase: 19 U/L (ref 11–51)

## 2017-03-21 LAB — ACETAMINOPHEN LEVEL: Acetaminophen (Tylenol), Serum: <10 ug/mL — ABNORMAL LOW (ref 10–30)

## 2017-03-21 LAB — SALICYLATE LEVEL

## 2017-03-21 LAB — POCT PREGNANCY, URINE: Preg Test, Ur: NEGATIVE

## 2017-03-21 MED ORDER — SODIUM CHLORIDE 0.9 % IV BOLUS (SEPSIS)
1000.0000 mL | Freq: Once | INTRAVENOUS | Status: AC
Start: 2017-03-21 — End: 2017-03-21
  Administered 2017-03-21: 1000 mL via INTRAVENOUS

## 2017-03-21 MED ORDER — FAMOTIDINE IN NACL 20-0.9 MG/50ML-% IV SOLN
20.0000 mg | Freq: Once | INTRAVENOUS | Status: AC
  Administered 2017-03-21: 20 mg via INTRAVENOUS
  Filled 2017-03-21: qty 50

## 2017-03-21 MED ORDER — METOCLOPRAMIDE HCL 5 MG/ML IJ SOLN
5.0000 mg | Freq: Once | INTRAMUSCULAR | Status: AC
  Administered 2017-03-21: 5 mg via INTRAVENOUS
  Filled 2017-03-21: qty 2

## 2017-03-21 NOTE — ED Notes (Signed)
Pt's s/o at bedside with pt's "service dog" at this time.

## 2017-03-21 NOTE — Discharge Instructions (Signed)
Return to the ER for worsening symptoms, persistent vomiting, difficulty breathing or other concerns. °

## 2017-03-21 NOTE — ED Notes (Signed)
Patient transported to CT at this time. 

## 2017-03-21 NOTE — ED Notes (Signed)
Pt returned to ED Rm 1 from CT at this time. 
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
## 2017-03-22 LAB — BLOOD GAS, ARTERIAL
ACID-BASE DEFICIT: 3.9 mmol/L — AB (ref 0.0–2.0)
Bicarbonate: 21.6 mmol/L (ref 20.0–28.0)
FIO2: 0.21
O2 Saturation: 96 %
PATIENT TEMPERATURE: 37
PCO2 ART: 40 mmHg (ref 32.0–48.0)
PO2 ART: 87 mmHg (ref 83.0–108.0)
pH, Arterial: 7.34 — ABNORMAL LOW (ref 7.350–7.450)

## 2017-04-05 ENCOUNTER — Ambulatory Visit: Payer: Self-pay | Admitting: Urology

## 2017-04-05 ENCOUNTER — Encounter: Payer: Self-pay | Admitting: Urology

## 2017-04-07 ENCOUNTER — Emergency Department
Admission: EM | Admit: 2017-04-07 | Discharge: 2017-04-07 | Disposition: A | Discharge status: Home / Self Care | Payer: Medicare Other | Attending: Emergency Medicine | Admitting: Emergency Medicine

## 2017-04-07 ENCOUNTER — Encounter: Payer: Self-pay | Admitting: Emergency Medicine

## 2017-04-07 ENCOUNTER — Other Ambulatory Visit: Payer: Self-pay

## 2017-04-07 DIAGNOSIS — I10 Essential (primary) hypertension: Secondary | ICD-10-CM | POA: Insufficient documentation

## 2017-04-07 DIAGNOSIS — Z79899 Other long term (current) drug therapy: Secondary | ICD-10-CM | POA: Diagnosis not present

## 2017-04-07 DIAGNOSIS — F17210 Nicotine dependence, cigarettes, uncomplicated: Secondary | ICD-10-CM | POA: Insufficient documentation

## 2017-04-07 DIAGNOSIS — Z794 Long term (current) use of insulin: Secondary | ICD-10-CM | POA: Insufficient documentation

## 2017-04-07 DIAGNOSIS — E119 Type 2 diabetes mellitus without complications: Secondary | ICD-10-CM | POA: Insufficient documentation

## 2017-04-07 DIAGNOSIS — R51 Headache: Secondary | ICD-10-CM | POA: Diagnosis not present

## 2017-04-07 DIAGNOSIS — Z8541 Personal history of malignant neoplasm of cervix uteri: Secondary | ICD-10-CM | POA: Insufficient documentation

## 2017-04-07 DIAGNOSIS — R519 Headache, unspecified: Secondary | ICD-10-CM

## 2017-04-07 MED ORDER — PROMETHAZINE HCL 12.5 MG PO TABS: 12.5000 mg | ORAL_TABLET | Freq: Four times a day (QID) | ORAL | 0 refills | Status: DC | PRN

## 2017-04-07 MED ORDER — KETOROLAC TROMETHAMINE 30 MG/ML IJ SOLN
30.0000 mg | Freq: Once | INTRAMUSCULAR | Status: AC
  Administered 2017-04-07: 30 mg via INTRAMUSCULAR
  Filled 2017-04-07: qty 1

## 2017-04-07 MED ORDER — PROMETHAZINE HCL 25 MG PO TABS
25.0000 mg | ORAL_TABLET | Freq: Once | ORAL | Status: AC
Start: 2017-04-07 — End: 2017-04-07
  Administered 2017-04-07: 25 mg via ORAL
  Filled 2017-04-07: qty 1

## 2017-04-07 NOTE — ED Provider Notes (Signed)
[facility] Emergency Department Provider Note  ____________________________________________   First MD Initiated Contact with Patient 04/07/17 0515     (approximate)  I have reviewed the triage vital signs and the nursing notes.   HISTORY  Chief Complaint Headache    HPI Alejandra Frederick is a 47 y.o. female with below list of chronic medical conditions including chronic headaches since a traumatic head injury years ago".  Presents to the emergency department with 1 day history of generalized headache consistent with previous headaches.  Patient states her current pain score is 8 out of 10 and accompanied by nausea and vomiting which she states is consistent with previous headaches.  Patient denies any weakness numbness gait instability or visual changes.     Past Medical History:  Diagnosis Date  . Cancer ([place])    cervical ca  . Diabetes mellitus without complication ([place])   . High cholesterol   . Hypertension   . Myocardial infarction ([place]) 2016  . Stroke [facility]) 2013    Patient Active Problem List   Diagnosis Date Noted  . Bipolar I disorder, most recent episode mixed ([place]) 08/24/2016  . Acute respiratory failure with hypoxia ([place])   . Accidental drug overdose   . Encephalopathy acute 08/22/2016    Past Surgical History:  Procedure Laterality Date  . TONSILLECTOMY    . TUBAL LIGATION      Prior to Admission medications   Medication Sig Start Date End Date Taking? Authorizing Provider  amitriptyline (ELAVIL) 25 MG tablet Take 50 mg by mouth at bedtime.    [provider]  busPIRone (BUSPAR) 10 MG tablet Take 10 mg by mouth 2 (two) times daily.    [provider]  clonazePAM (KLONOPIN) 2 MG tablet Take 2 mg by mouth 2 (two) times daily. Takes 1 tablet po BID + 1/2 tablet HS (= 2.5 tablets daily)    [provider]  clonazePAM (KLONOPIN) 2 MG tablet Take 1 mg by mouth at bedtime. Takes 1 tablet po BID + 1/2 tablet HS  (= 2.5 tablets daily)    [provider]  cyclobenzaprine (FLEXERIL) 5 MG tablet Take 5 mg by mouth at bedtime.    [provider]  diclofenac sodium (VOLTAREN) 1 % GEL APPLY 2 GMS TO AFFECTED AREA FOUR TIMES DAILY 05/26/16   [provider]  divalproex (DEPAKOTE ER) 500 MG 24 hr tablet Take 500 mg by mouth. 01/30/16   [provider]  divalproex (DEPAKOTE) 500 MG DR tablet Take 500 mg by mouth 2 (two) times daily.    [provider]  fenofibrate (TRICOR) 48 MG tablet Take by mouth. 10/15/16 10/15/17  [provider]  gabapentin (NEURONTIN) 800 MG tablet Take 800 mg by mouth. 05/25/16 05/25/17  [provider]  HYDROcodone-acetaminophen (NORCO/VICODIN) 5-325 MG tablet Take by mouth. 04/20/16   [provider]  ibuprofen (ADVIL,MOTRIN) 800 MG tablet Take 800 mg by mouth.    [provider]  insulin aspart (NOVOLOG) 100 UNIT/ML injection 150-200:    2 units,  201-250 :   4 units 251-300:    6 units 301- 350 :    8 units 351-400:    10units 401 :  10 units and call MD 08/28/16   [provider]  insulin degludec (TRESIBA FLEXTOUCH) 100 UNIT/ML SOPN FlexTouch Pen Inject 60 Units into the skin daily at 6 (six) AM.     [provider]  Insulin Pen Needle (FIFTY50 PEN NEEDLES) 32G X  4 MM MISC For use with insulin pen 03/19/16   [provider]  lidocaine (XYLOCAINE) 2 % solution USE AS DIRECTED 1 ML BY MOUTH EVERY 3 HOURS AS NEEDED 05/06/16   [provider]  liraglutide (VICTOZA) 18 MG/3ML SOPN Inject into the skin.    [provider]  lurasidone (LATUDA) 80 MG TABS tablet Take by mouth.    [provider]  metoprolol succinate (TOPROL-XL) 25 MG 24 hr tablet Take by mouth. 10/19/16 10/19/17  [provider]  nortriptyline (PAMELOR) 10 MG capsule Take 10 mg by mouth at bedtime.    [provider]  prazosin (MINIPRESS) 5 MG capsule Take 10 mg by mouth at bedtime.     [provider]  prazosin (MINIPRESS) 5 MG capsule Take by mouth.    [provider]  topiramate (TOPAMAX) 25 MG tablet Take 50 mg by mouth 2 (two) times daily.    [provider]  traMADol (ULTRAM) 50 MG tablet Take 1 tablet (50 mg total) by mouth every 6 (six) hours as needed. 10/15/16   Harvest Dark, MD    Allergies Bee venom; Ciprofloxacin; and Zofran [ondansetron hcl]  No family history on file.  Social History Social History   Tobacco Use  . Smoking status: Current Every Day Smoker    Packs/day: 0.50  . Smokeless tobacco: Never Used  Substance Use Topics  . Alcohol use: No  . Drug use: No    Review of Systems Constitutional: No fever/chills Eyes: No visual changes. ENT: No sore throat. Cardiovascular: Denies chest pain. Respiratory: Denies shortness of breath. Gastrointestinal: No abdominal pain.  No nausea, no vomiting.  No diarrhea.  No constipation. Genitourinary: Negative for dysuria. Musculoskeletal: Negative for neck pain.  Negative for back pain. Integumentary: Negative for rash. Neurological: Positive for headaches, negative for focal weakness or numbness.   ____________________________________________   PHYSICAL EXAM:  VITAL SIGNS: ED Triage Vitals  Enc Vitals Group     BP 04/07/17 0501 114/81     Pulse Rate 04/07/17 0501 92     Resp 04/07/17 0501 18     Temp 04/07/17 0501 97.8 F (36.6 C)     Temp Source 04/07/17 0501 Oral     SpO2 04/07/17 0501 98 %     Weight 04/07/17 0459 70.8 kg (156 lb)     Height 04/07/17 0459 1.6 m (5\' 3" )     Head Circumference --      Peak Flow --      Pain Score 04/07/17 0459 10     Pain Loc --      Pain Edu? --      Excl. in [place]? --     Constitutional: Alert and oriented. Well appearing and in no acute distress. Eyes: Conjunctivae are normal. PERRL. EOMI. Head: Atraumatic. Mouth/Throat: Mucous membranes are moist. Oropharynx non-erythematous. Neck: No stridor.  No meningeal  signs.   Cardiovascular: Normal rate, regular rhythm. Good peripheral circulation. Grossly normal heart sounds. Respiratory: Normal respiratory effort.  No retractions. Lungs CTAB. Gastrointestinal: Soft and nontender. No distention.  Musculoskeletal: No lower extremity tenderness nor edema. No gross deformities of extremities. Neurologic:  Normal speech and language. No gross focal neurologic deficits are appreciated.  Skin:  Skin is warm, dry and intact. No rash noted. Psychiatric: Mood and affect are normal. Speech and behavior are normal.     Procedures   ____________________________________________   INITIAL IMPRESSION / ASSESSMENT AND PLAN / ED COURSE  As part of my medical  decision making, I reviewed the following data within the electronic MEDICAL RECORD NUMBER  47 year old female presented with above-stated history and physical exam of headache consistent with previous chronic headaches.  Patient is very eager to leave the emergency department secondary to the fact that she says she has an appointment this morning for an EEG.  Patient received IM Toradol and Phenergan with complete resolution of headache and vomiting.    ____________________________________________  FINAL CLINICAL IMPRESSION(S) / ED DIAGNOSES  Final diagnoses:  Acute nonintractable headache, unspecified headache type     MEDICATIONS GIVEN DURING THIS VISIT:  Medications  promethazine (PHENERGAN) tablet 25 mg (25 mg Oral Given 04/07/17 0541)  ketorolac (TORADOL) 30 MG/ML injection 30 mg (30 mg Intramuscular Given 04/07/17 0542)     ED Discharge Orders    None       Note:  This document was prepared using Dragon voice recognition software and may include unintentional dictation errors.    Gregor Hams, MD 04/07/17 2245
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
## 2017-04-20 NOTE — Progress Notes (Deleted)
04/21/2017 10:09 PM   Alejandra Frederick March 27, 1970 [id number]  Referring provider: Tracie Harrier, [facility] [place], [place] [address]  No chief complaint on file.   HPI: Patient is a 47 -year-old Caucasian female who presents today as a referral from their PCP, Dr. Glendon Axe, for microscopic hematuria.    Patient was found to have microscopic hematuria on 2 occasions, 09/09/2016 and 09/30/2016 with 4-10 RBC's/hpf.  Patient states that her urine has a pink-tinged urine over the last three years.     She states that she has a prior history of recurrent urinary tract infections (patient states that she has had 7 UTI's over the last year, there is no documentation of positive urine cultures, but she moved from [facility] three months ago), she denies any history of nephrolithiasis, trauma to the genitourinary tract or malignancies of the genitourinary tract.   She does not have a family medical history of nephrolithiasis, malignancies of the genitourinary tract or hematuria.   Today, she is having symptoms of frequent urination x 15, urgency is strong, dysuria at the end of the stream, nocturia x 2, incontinence (SUI and UI, wears pads sometimes), hesitancy, intermittency, straining to urinate or a weak urinary stream.  She has been experiencing these symptoms for awhile.  Her UA today demonstrates no hematuria.  Her PVR is 75 mL.  She is experiencing suprapubic pain for awhile.  She denies any recent fevers, chills, nausea or vomiting.    She has diarrhea all the time.  This is been going on for a long time.    She did have a contrast CT at [facility] in May 2018 for nausea, vomiting and diarrhea.  Findings positive for minimal enterocolitis, with secondary ileus.No bowel obstruction.  Congenital uterine anomaly, favor septate uterus.  I do not have access to the images.  She is a smoker.   She has not worked with Sports administrator,  trichloroethylene, etc.   She drinks 2 bottles of water (16 oz and a cup of coffee in the am).   She is a diabetes.  Last HbgA1c was 7.3%.    PMH: Past Medical History:  Diagnosis Date  . Cancer ([place])    cervical ca  . Diabetes mellitus without complication ([place])   . High cholesterol   . Hypertension   . Myocardial infarction ([place]) 2016  . Stroke [facility]) 2013    Surgical History: Past Surgical History:  Procedure Laterality Date  . TONSILLECTOMY    . TUBAL LIGATION      Home Medications:  Allergies as of 04/21/2017      Reactions   Bee Venom Anaphylaxis   Ciprofloxacin Anaphylaxis   Zofran [ondansetron Hcl] Anaphylaxis   Swelling of the throat and redness of the face      Medication List        Accurate as of 04/20/17 10:09 PM. Always use your most recent med list.          amitriptyline 25 MG tablet Commonly known as:  ELAVIL Take 50 mg by mouth at bedtime.   busPIRone 10 MG tablet Commonly known as:  BUSPAR Take 10 mg by mouth 2 (two) times daily.   clonazePAM 2 MG tablet Commonly known as:  KLONOPIN Take 2 mg by mouth 2 (two) times daily. Takes 1 tablet po BID + 1/2 tablet HS (= 2.5 tablets daily)   clonazePAM 2 MG tablet Commonly known as:  KLONOPIN Take 1 mg by mouth  at bedtime. Takes 1 tablet po BID + 1/2 tablet HS (= 2.5 tablets daily)   cyclobenzaprine 5 MG tablet Commonly known as:  FLEXERIL Take 5 mg by mouth at bedtime.   diclofenac sodium 1 % Gel Commonly known as:  VOLTAREN APPLY 2 GMS TO AFFECTED AREA FOUR TIMES DAILY   divalproex 500 MG DR tablet Commonly known as:  DEPAKOTE Take 500 mg by mouth 2 (two) times daily.   divalproex 500 MG 24 hr tablet Commonly known as:  DEPAKOTE ER Take 500 mg by mouth.   fenofibrate 48 MG tablet Commonly known as:  TRICOR Take by mouth.   FIFTY50 PEN NEEDLES 32G X 4 MM Misc Generic drug:  Insulin Pen Needle For use with insulin pen   gabapentin 800 MG tablet Commonly known as:  NEURONTIN Take  800 mg by mouth.   HYDROcodone-acetaminophen 5-325 MG tablet Commonly known as:  NORCO/VICODIN Take by mouth.   ibuprofen 800 MG tablet Commonly known as:  ADVIL,MOTRIN Take 800 mg by mouth.   insulin aspart 100 UNIT/ML injection Commonly known as:  novoLOG 150-200:    2 units,  201-250 :   4 units 251-300:    6 units 301- 350 :    8 units 351-400:    10units 401 :  10 units and call MD   lidocaine 2 % solution Commonly known as:  XYLOCAINE USE AS DIRECTED 1 ML BY MOUTH EVERY 3 HOURS AS NEEDED   lurasidone 80 MG Tabs tablet Commonly known as:  LATUDA Take by mouth.   metoprolol succinate 25 MG 24 hr tablet Commonly known as:  TOPROL-XL Take by mouth.   nortriptyline 10 MG capsule Commonly known as:  PAMELOR Take 10 mg by mouth at bedtime.   prazosin 5 MG capsule Commonly known as:  MINIPRESS Take 10 mg by mouth at bedtime.   prazosin 5 MG capsule Commonly known as:  MINIPRESS Take by mouth.   promethazine 12.5 MG tablet Commonly known as:  PHENERGAN Take 1 tablet (12.5 mg total) by mouth every 6 (six) hours as needed for nausea or vomiting.   topiramate 25 MG tablet Commonly known as:  TOPAMAX Take 50 mg by mouth 2 (two) times daily.   traMADol 50 MG tablet Commonly known as:  ULTRAM Take 1 tablet (50 mg total) by mouth every 6 (six) hours as needed.   TRESIBA FLEXTOUCH 100 UNIT/ML Sopn FlexTouch Pen Generic drug:  insulin degludec Inject 60 Units into the skin daily at 6 (six) AM.   VICTOZA 18 MG/3ML Sopn Generic drug:  liraglutide Inject into the skin.       Allergies:  Allergies  Allergen Reactions  . Bee Venom Anaphylaxis  . Ciprofloxacin Anaphylaxis  . Zofran [Ondansetron Hcl] Anaphylaxis    Swelling of the throat and redness of the face     Family History: No family history on file.  Social History:  reports that she has been smoking.  She has been smoking about 0.50 packs per day. she has never used smokeless tobacco. She reports that  she does not drink alcohol or use drugs.  ROS:                                        Physical Exam: LMP 03/29/2017   Constitutional: Well nourished. Alert and oriented, No acute distress. HEENT: [place] AT, moist mucus membranes. Trachea midline, no masses.  Cardiovascular: No clubbing, cyanosis, or edema. Respiratory: Normal respiratory effort, no increased work of breathing. GI: Abdomen is soft, non tender, non distended, no abdominal masses. Liver and spleen not palpable.  No hernias appreciated.  Stool sample for occult testing is not indicated.   GU: No CVA tenderness.  No bladder fullness or masses.  Normal external genitalia, normal pubic hair distribution, no lesions.  Normal urethral meatus, no lesions, no prolapse, no discharge.   No urethral masses, tenderness and/or tenderness. No bladder fullness, tenderness or masses. Normal vagina mucosa, good estrogen effect, no discharge, no lesions, good pelvic support, Grade II cystocele or rectocele noted.  No cervical motion tenderness.  Uterus is freely mobile and non-fixed.  No adnexal/parametria masses or tenderness noted.  Anus and perineum are without rashes or lesions.    Skin: No rashes, bruises or suspicious lesions. Lymph: No cervical or inguinal adenopathy. Neurologic: Grossly intact, no focal deficits, moving all 4 extremities. Psychiatric: Normal mood and affect.  Laboratory Data: Lab Results  Component Value Date   WBC 10.3 03/21/2017   HGB 13.7 03/21/2017   HCT 40.5 03/21/2017   MCV 90.3 03/21/2017   PLT 212 03/21/2017    Lab Results  Component Value Date   CREATININE 0.90 03/21/2017    Lab Results  Component Value Date   HGBA1C 7.5 (H) 08/23/2016    Lab Results  Component Value Date   AST 24 03/21/2017   Lab Results  Component Value Date   ALT 22 03/21/2017    Urinalysis Unremarkable.  See EPIC.    I have reviewed the labs  Pertinent Imaging: PROCEDURE: CT ABDOMEN PELVIS W  CONTRAST  CLINICAL HISTORY:Nausea, vomiting and diarrhea  COMPARISON:02/23/2016.  TECHNIQUE: 0.65 mm serial axial images were obtained with IV contrast.Multiplanar reconstructions performed.Automated exposure control adjustment of mA and/or KV performed according to patient size.  FINDINGS:   CT ABDOMEN: Liver and spleen are normal.Normal gallbladder.No biliary ductal dilatation.Pancreas is normal.Normal adrenals.Normal kidneys.Normal caliber visualized aorta, with no dissection.Lung bases are clear.No bulky retroperitoneal or mesenteric adenopathy.No ascites.Minimal distention of the colon with multiple air-fluid levels, compatible with ileus.Wall thickening and minimal edema involves the proximal ascending colon, representing minimal colitis.Minimal thickening of the proximal appendix, with normal caliber distally and no wall thickening or periappendiceal stranding.There are a few minimally prominent fluid-filled loops of proximal small bowel in the left upper quadrant.Findings therefore represent minimal enterocolitis with secondary ileus.No anatomic bowel obstruction.   CT PELVIS: Urinary bladder is normal.No bulky pelvic or inguinal lymphadenopathy.No pelvic free fluid. Heterogeneous uterus, with bilateral tubal ligation clips.Probable congenital uterine anomaly, favor a septate uterus over bicornuate uterus.Bones are unremarkable.   IMPRESSION:  1.Minimal enterocolitis, with secondary ileus.No bowel obstruction.  2.Congenital uterine anomaly, favor septate uterus.  Signed (Electronic Signature): 06/21/2016 8:21 AM Signed [id number]  Assessment & Plan:    1. Microscopic hematuria  - I explained to the patient that there are a number of causes that can be associated with blood in the urine, such as stones, UTI's, damage to the urinary tract and/or cancer.  - At this time, I felt that the patient warranted further  urologic evaluation.   The AUA guidelines state that a CT urogram is the preferred imaging study to evaluate hematuria - patient's CT performed in May was only with contrast - I did explain that it can miss some urological tumors and the recommendation would be to pursue a CTU at this time  - I explained to the patient that a contrast  material will be injected into a vein and that in rare instances, an allergic reaction can result and may even life threatening   The patient denies any allergies to contrast, iodine and/or seafood and is not taking metformin.  - Her reproductive status is tubal ligation   - Following the imaging study,  I've recommended a cystoscopy. I described how this is performed, typically in an office setting with a flexible cystoscope. We described the risks, benefits, and possible side effects, the most common of which is a minor amount of blood in the urine and/or burning which usually resolves in 24 to 48 hours.    - The patient had the opportunity to ask questions which were answered. Based upon this discussion, the patient is willing to proceed. Therefore, I've ordered: a CT Urogram and cystoscopy.  - The patient will return following all of the above for discussion of the results.   - UA  - Urine culture   - BUN + creatinine     2. LU TS  - we will reassess if no etiology is found for her symptoms after the hematuria work up  - Did recommend patient that she increase her water intake to 1.5 L daily  3. Cystocele  - Explained to the patient how this is contributing to the leakage when she laughs coughs and sneezes  - We will reassess once hematuria workup is completed  No Follow-up on file.  These notes generated with voice recognition software. I apologize for typographical errors.  Zara Council, [facility] [address] [place], [place] [address] [phone]

## 2017-04-21 ENCOUNTER — Ambulatory Visit: Payer: Self-pay | Admitting: Urology

## 2017-04-21 ENCOUNTER — Encounter: Payer: Self-pay | Admitting: Urology

## 2017-04-30 DIAGNOSIS — G43719 Chronic migraine without aura, intractable, without status migrainosus: Secondary | ICD-10-CM | POA: Insufficient documentation

## 2017-05-03 ENCOUNTER — Other Ambulatory Visit: Payer: Self-pay | Admitting: Acute Care

## 2017-05-03 DIAGNOSIS — H539 Unspecified visual disturbance: Secondary | ICD-10-CM

## 2017-05-03 DIAGNOSIS — G8929 Other chronic pain: Secondary | ICD-10-CM

## 2017-05-03 DIAGNOSIS — R519 Headache, unspecified: Secondary | ICD-10-CM

## 2017-05-03 DIAGNOSIS — R51 Headache: Principal | ICD-10-CM

## 2017-05-03 DIAGNOSIS — R4020 Unspecified coma: Secondary | ICD-10-CM

## 2017-05-03 DIAGNOSIS — R569 Unspecified convulsions: Secondary | ICD-10-CM

## 2017-05-05 ENCOUNTER — Encounter: Payer: Self-pay | Admitting: Urology

## 2017-05-05 ENCOUNTER — Ambulatory Visit (INDEPENDENT_AMBULATORY_CARE_PROVIDER_SITE_OTHER): Payer: Medicare Other | Admitting: Urology

## 2017-05-05 VITALS — BP 115/79 | HR 101 | Ht 63.0 in | Wt 170.7 lb

## 2017-05-05 DIAGNOSIS — R399 Unspecified symptoms and signs involving the genitourinary system: Secondary | ICD-10-CM

## 2017-05-05 DIAGNOSIS — N8111 Cystocele, midline: Secondary | ICD-10-CM

## 2017-05-05 DIAGNOSIS — R310 Gross hematuria: Secondary | ICD-10-CM

## 2017-05-05 LAB — MICROSCOPIC EXAMINATION

## 2017-05-05 LAB — URINALYSIS, COMPLETE
Bilirubin, UA: NEGATIVE
LEUKOCYTES UA: NEGATIVE
Nitrite, UA: NEGATIVE
PH UA: 5.5 (ref 5.0–7.5)
Specific Gravity, UA: >1.03 — ABNORMAL HIGH (ref 1.005–1.030)
Urobilinogen, Ur: 0.2 mg/dL (ref 0.2–1.0)

## 2017-05-05 NOTE — Progress Notes (Signed)
05/05/2017 4:33 PM   Alejandra Frederick 06/28/1970 [id number]  Referring provider: Tracie Harrier, MD [address] [facility] [place], [place] [address]  Chief Complaint  Patient presents with  . Hematuria    HPI: Patient is a 47 year old Caucasian female who presented to [place] in September 2018 for the complaints of gross hematuria and urinary incontinence.  She did not go forward with a hematuria workup due to personal issues and her significant other's medical issues.  She states she is now in a place where she can address her own health issues and would like to move forward.  Background history Patient is a 15 -year-old Caucasian female who presents today as a referral from their PCP, Dr. Glendon Axe, for microscopic hematuria.  Patient was found to have microscopic hematuria on 2 occasions, 09/09/2016 and 09/30/2016 with 4-10 RBC's/hpf.  Patient states that her urine has a pink-tinged urine over the last three years.   She states that she has a prior history of recurrent urinary tract infections (patient states that she has had 7 UTI's over the last year, there is no documentation of positive urine cultures, but she moved from [facility] three months ago), she denies any history of nephrolithiasis, trauma to the genitourinary tract or malignancies of the genitourinary tract.   She does not have a family medical history of nephrolithiasis, malignancies of the genitourinary tract or hematuria.  On 11/02/2016, she was having symptoms of frequent urination x 15, urgency is strong, dysuria at the end of the stream, nocturia x 2, incontinence (SUI and UI, wears pads sometimes), hesitancy, intermittency, straining to urinate or a weak urinary stream.  She has been experiencing these symptoms for awhile.  Her UA was negative for hematuria.  Her PVR was 75 mL.  She is experiencing suprapubic pain for awhile.  She denies any recent fevers, chills, nausea or vomiting.   She  has diarrhea all the time.  This is been going on for a long time.  She did have a contrast CT at [facility] in May 2018 for nausea, vomiting and diarrhea.  Findings positive for minimal enterocolitis, with secondary ileus.No bowel obstruction.  Congenital uterine anomaly, favor septate uterus.  I do not have access to the images.  She is a smoker.   She has not worked with Sports administrator, trichloroethylene, etc.   She drinks 2 bottles of water (16 oz and a cup of coffee in the am).   She is a diabetes.  Last HbgA1c was 7.3%.    Today, she states she is experiencing frequency (q 15-20 minutes, only urinates "drops"), nocturia ( x 3-4), incontinence (constantly feels wet and also has SUI), intermittency (started a month ago), hesitancy (started a month ago), straining to urinate(started a month ago) and a weak urinary stream.  Patient denies any gross hematuria, dysuria or suprapubic/flank pain.  Patient denies any fevers, chills, nausea or vomiting.   She states that she does not drink a lot of water.  She states that she drinks 3-4 bottles of propanolol daily.  She also drinks sweet tea and coffee.  She denies any alcohol consumption.  She also denies any drug use.  She did test positive for MDMA, benzodiazapine's and tricyclics in the ED on 81/85/6314.  She states that she has been saved and is not abusing drugs.    Her UA was negative for AMH today.  It was positive for moderate bacteria.      PMH: Past Medical History:  Diagnosis Date  . Cancer ([place])    cervical ca  . Diabetes mellitus without complication ([place])   . High cholesterol   . Hypertension   . Myocardial infarction ([place]) 2016  . Stroke [facility]) 2013    Surgical History: Past Surgical History:  Procedure Laterality Date  . TONSILLECTOMY    . TUBAL LIGATION      Home Medications:  Allergies as of 05/05/2017      Reactions   Bee Venom Anaphylaxis   Ciprofloxacin Anaphylaxis   Zofran [ondansetron Hcl] Anaphylaxis   Swelling  of the throat and redness of the face      Medication List        Accurate as of 05/05/17  4:33 PM. Always use your most recent med list.          AJOVY 225 MG/1.5ML Sos[place] Generic drug:  Fremanezumab-vfrm Inject into the skin.   busPIRone 10 MG tablet Commonly known as:  BUSPAR Take 10 mg by mouth 2 (two) times daily.   clonazePAM 2 MG tablet Commonly known as:  KLONOPIN Take 2 mg by mouth 2 (two) times daily. Takes 1 tablet po BID + 1/2 tablet HS (= 2.5 tablets daily)   cyclobenzaprine 5 MG tablet Commonly known as:  FLEXERIL Take 5 mg by mouth at bedtime.   diclofenac sodium 1 % Gel Commonly known as:  VOLTAREN APPLY 2 GMS TO AFFECTED AREA FOUR TIMES DAILY   fenofibrate 48 MG tablet Commonly known as:  TRICOR Take by mouth.   FIFTY50 PEN NEEDLES 32G X 4 MM Misc Generic drug:  Insulin Pen Needle For use with insulin pen   gabapentin 800 MG tablet Commonly known as:  NEURONTIN Take 800 mg by mouth.   HYDROcodone-acetaminophen 5-325 MG tablet Commonly known as:  NORCO/VICODIN Take by mouth.   insulin aspart 100 UNIT/ML injection Commonly known as:  novoLOG 150-200:    2 units,  201-250 :   4 units 251-300:    6 units 301- 350 :    8 units 351-400:    10units 401 :  10 units and call MD   lidocaine 2 % solution Commonly known as:  XYLOCAINE USE AS DIRECTED 1 ML BY MOUTH EVERY 3 HOURS AS NEEDED   metoprolol succinate 25 MG 24 hr tablet Commonly known as:  TOPROL-XL Take by mouth.   nortriptyline 10 MG capsule Commonly known as:  PAMELOR Take 10 mg by mouth at bedtime.   prazosin 5 MG capsule Commonly known as:  MINIPRESS Take 10 mg by mouth at bedtime.   promethazine 12.5 MG tablet Commonly known as:  PHENERGAN Take 1 tablet (12.5 mg total) by mouth every 6 (six) hours as needed for nausea or vomiting.   rizatriptan 10 MG tablet Commonly known as:  MAXALT Take 10 mg by mouth as needed for migraine. May repeat in 2 hours if needed   topiramate 25  MG tablet Commonly known as:  TOPAMAX Take 50 mg by mouth 2 (two) times daily.   traMADol 50 MG tablet Commonly known as:  ULTRAM Take 1 tablet (50 mg total) by mouth every 6 (six) hours as needed.   traZODone 150 MG tablet Commonly known as:  DESYREL Take by mouth at bedtime.       Allergies:  Allergies  Allergen Reactions  . Bee Venom Anaphylaxis  . Ciprofloxacin Anaphylaxis  . Zofran [Ondansetron Hcl] Anaphylaxis    Swelling of the throat and redness of the face  Family History: History reviewed. No pertinent family history.  Social History:  reports that she has been smoking.  She has been smoking about 0.50 packs per day. She has never used smokeless tobacco. She reports that she does not drink alcohol or use drugs.  ROS: UROLOGY Frequent Urination?: Yes Hard to postpone urination?: No Burning/pain with urination?: No Get up at night to urinate?: Yes Leakage of urine?: Yes Urine stream starts and stops?: Yes Trouble starting stream?: Yes Do you have to strain to urinate?: Yes Blood in urine?: No Urinary tract infection?: No Sexually transmitted disease?: No Injury to kidneys or bladder?: No Painful intercourse?: Yes Weak stream?: Yes Currently pregnant?: No Vaginal bleeding?: No Last menstrual period?: n  Gastrointestinal Nausea?: No Vomiting?: No Indigestion/heartburn?: Yes Diarrhea?: Yes Constipation?: No  Constitutional Fever: No Night sweats?: No Weight loss?: No Fatigue?: No  Skin Skin rash/lesions?: No Itching?: No  Eyes Blurred vision?: Yes Double vision?: No  Ears/Nose/Throat Sore throat?: No Sinus problems?: No  Hematologic/Lymphatic Swollen glands?: No Easy bruising?: Yes  Cardiovascular Leg swelling?: No Chest pain?: No  Respiratory Cough?: No Shortness of breath?: No  Endocrine Excessive thirst?: No  Musculoskeletal Back pain?: No Joint pain?: No  Neurological Headaches?: Yes Dizziness?:  Yes  Psychologic Depression?: Yes Anxiety?: Yes  Physical Exam: BP 115/79 (BP Location: Right Arm, Patient Position: Sitting, Cuff Size: Normal)   Pulse (!) 101   Ht 5\' 3"  (1.6 m)   Wt 170 lb 11.2 oz (77.4 kg)   BMI 30.24 kg/m   Constitutional: Well nourished. Alert and oriented, No acute distress. HEENT: [place] AT, moist mucus membranes. Trachea midline, no masses. Cardiovascular: No clubbing, cyanosis, or edema. Respiratory: Normal respiratory effort, no increased work of breathing. GI: Abdomen is soft, non tender, non distended, no abdominal masses. Liver and spleen not palpable.  No hernias appreciated.  Stool sample for occult testing is not indicated.   GU: No CVA tenderness.  No bladder fullness or masses.  Normal external genitalia, normal pubic hair distribution, no lesions.  Normal urethral meatus, no lesions, no prolapse, no discharge.   No urethral masses, tenderness and/or tenderness. No bladder fullness, tenderness or masses. Normal vagina mucosa, good estrogen effect, no discharge, no lesions, good pelvic support, Grade II cystocele or rectocele noted.  No cervical motion tenderness.  Uterus is freely mobile and non-fixed.  No adnexal/parametria masses or tenderness noted.  Anus and perineum are without rashes or lesions.    Skin: No rashes, bruises or suspicious lesions. Lymph: No cervical or inguinal adenopathy. Neurologic: Grossly intact, no focal deficits, moving all 4 extremities. Psychiatric: Normal mood and affect.  Laboratory Data: Lab Results  Component Value Date   WBC 10.3 03/21/2017   HGB 13.7 03/21/2017   HCT 40.5 03/21/2017   MCV 90.3 03/21/2017   PLT 212 03/21/2017    Lab Results  Component Value Date   CREATININE 0.90 03/21/2017    Lab Results  Component Value Date   HGBA1C 7.5 (H) 08/23/2016    Lab Results  Component Value Date   AST 24 03/21/2017   Lab Results  Component Value Date   ALT 22 03/21/2017    Urinalysis Unremarkable.   See EPIC.    I have reviewed the labs  Pertinent Imaging: PROCEDURE: CT ABDOMEN PELVIS W CONTRAST  CLINICAL HISTORY:Nausea, vomiting and diarrhea  COMPARISON:02/23/2016.  TECHNIQUE: 0.65 mm serial axial images were obtained with IV contrast.Multiplanar reconstructions performed.Automated exposure control adjustment of mA and/or KV performed according to patient size.  FINDINGS:   CT ABDOMEN: Liver and spleen are normal.Normal gallbladder.No biliary ductal dilatation.Pancreas is normal.Normal adrenals.Normal kidneys.Normal caliber visualized aorta, with no dissection.Lung bases are clear.No bulky retroperitoneal or mesenteric adenopathy.No ascites.Minimal distention of the colon with multiple air-fluid levels, compatible with ileus.Wall thickening and minimal edema involves the proximal ascending colon, representing minimal colitis.Minimal thickening of the proximal appendix, with normal caliber distally and no wall thickening or periappendiceal stranding.There are a few minimally prominent fluid-filled loops of proximal small bowel in the left upper quadrant.Findings therefore represent minimal enterocolitis with secondary ileus.No anatomic bowel obstruction.   CT PELVIS: Urinary bladder is normal.No bulky pelvic or inguinal lymphadenopathy.No pelvic free fluid. Heterogeneous uterus, with bilateral tubal ligation clips.Probable congenital uterine anomaly, favor a septate uterus over bicornuate uterus.Bones are unremarkable.   IMPRESSION:  1.Minimal enterocolitis, with secondary ileus.No bowel obstruction.  2.Congenital uterine anomaly, favor septate uterus.  Signed (Electronic Signature): 06/21/2016 8:21 AM Signed PY:KDXI BIZZELL  Assessment & Plan:    1. Microscopic hematuria  - I explained to the patient that there are a number of causes that can be associated with blood in the urine, such as stones, UTI's, damage to  the urinary tract and/or cancer.  - At this time, I felt that the patient warranted further urologic evaluation.   The AUA guidelines state that a CT urogram is the preferred imaging study to evaluate hematuria  - I explained to the patient that a contrast material will be injected into a vein and that in rare instances, an allergic reaction can result and may even life threatening   The patient denies any allergies to contrast, iodine and/or seafood and is not taking metformin.  - Her reproductive status is tubal ligation   - Following the imaging study,  I've recommended a cystoscopy. I described how this is performed, typically in an office setting with a flexible cystoscope. We described the risks, benefits, and possible side effects, the most common of which is a minor amount of blood in the urine and/or burning which usually resolves in 24 to 48 hours.    - The patient had the opportunity to ask questions which were answered. Based upon this discussion, the patient is willing to proceed. Therefore, I've ordered: a CT Urogram and cystoscopy.  - The patient will return following all of the above for discussion of the results.   - UA  - Urine culture   - BUN + creatinine     2. LU TS  - Did recommend patient that she increase her water intake to 1.5 L daily  - encouraged to discontinue caffeine drinks  3. Cystocele  - Explained to the patient how this is contributing to the leakage when she laughs coughs and sneezes    I did discuss her case with Dr. Matilde Sprang regarding her cystocele and stress incontinence.  He stated that due to her multiple co morbidities she would be best served at an academic center if she wanted to pursue further treatment for her cystocele and stress incontinence.  I did explain to the patient that we will move forward with a hematuria workup and will make the appropriate referral once the hematuria workup has been completed if appropriate.  Return for CT Urogram  report and cystoscopy.  These notes generated with voice recognition software. I apologize for typographical errors.  Zara Council, [facility] [address] [place], [place] [address] [phone]

## 2017-05-06 ENCOUNTER — Other Ambulatory Visit: Payer: Medicare Other

## 2017-05-06 ENCOUNTER — Other Ambulatory Visit: Payer: Self-pay | Admitting: Urology

## 2017-05-06 DIAGNOSIS — R350 Frequency of micturition: Secondary | ICD-10-CM

## 2017-05-06 LAB — BUN+CREAT
BUN / CREAT RATIO: 11 (ref 9–23)
BUN: 12 mg/dL (ref 6–24)
Creatinine, Ser: 1.08 mg/dL — ABNORMAL HIGH (ref 0.57–1.00)
GFR, EST AFRICAN AMERICAN: 71 mL/min/{1.73_m2} (ref >59)
GFR, EST NON AFRICAN AMERICAN: 61 mL/min/{1.73_m2} (ref >59)

## 2017-05-06 LAB — HCG, SERUM, QUALITATIVE: HCG, BETA SUBUNIT, QUAL, SERUM: NEGATIVE m[IU]/mL (ref <6)

## 2017-05-08 LAB — URINE CULTURE

## 2017-05-11 ENCOUNTER — Ambulatory Visit
Admission: RE | Admit: 2017-05-11 | Discharge: 2017-05-11 | Disposition: A | Discharge status: Home / Self Care | Payer: Medicare Other | Source: Ambulatory Visit | Attending: Acute Care | Admitting: Acute Care

## 2017-05-11 DIAGNOSIS — R569 Unspecified convulsions: Secondary | ICD-10-CM | POA: Diagnosis not present

## 2017-05-11 DIAGNOSIS — R51 Headache: Secondary | ICD-10-CM | POA: Diagnosis not present

## 2017-05-11 DIAGNOSIS — H539 Unspecified visual disturbance: Secondary | ICD-10-CM | POA: Insufficient documentation

## 2017-05-11 DIAGNOSIS — G8929 Other chronic pain: Secondary | ICD-10-CM

## 2017-05-11 DIAGNOSIS — R519 Headache, unspecified: Secondary | ICD-10-CM

## 2017-05-11 DIAGNOSIS — R4020 Unspecified coma: Secondary | ICD-10-CM | POA: Diagnosis present

## 2017-05-24 ENCOUNTER — Ambulatory Visit
Admission: RE | Admit: 2017-05-24 | Discharge: 2017-05-24 | Disposition: A | Discharge status: Home / Self Care | Payer: Medicare Other | Source: Ambulatory Visit | Attending: Urology | Admitting: Urology

## 2017-05-24 DIAGNOSIS — K449 Diaphragmatic hernia without obstruction or gangrene: Secondary | ICD-10-CM | POA: Diagnosis not present

## 2017-05-24 DIAGNOSIS — I700 Atherosclerosis of aorta: Secondary | ICD-10-CM | POA: Insufficient documentation

## 2017-05-24 DIAGNOSIS — R310 Gross hematuria: Secondary | ICD-10-CM | POA: Diagnosis not present

## 2017-05-24 HISTORY — DX: Malignant neoplasm of cervix uteri, unspecified: C53.9

## 2017-05-24 MED ORDER — IOPAMIDOL (ISOVUE-300) INJECTION 61%
125.0000 mL | Freq: Once | INTRAVENOUS | Status: AC | PRN
  Administered 2017-05-24: 125 mL via INTRAVENOUS

## 2017-05-27 ENCOUNTER — Ambulatory Visit: Payer: Medicare Other | Admitting: Urology

## 2017-05-27 VITALS — BP 109/74 | HR 96 | Ht 63.0 in | Wt 172.8 lb

## 2017-05-27 DIAGNOSIS — M6289 Other specified disorders of muscle: Secondary | ICD-10-CM

## 2017-05-27 DIAGNOSIS — R310 Gross hematuria: Secondary | ICD-10-CM

## 2017-05-27 LAB — URINALYSIS, COMPLETE
Bilirubin, UA: NEGATIVE
Ketones, UA: NEGATIVE
Leukocytes, UA: NEGATIVE
NITRITE UA: NEGATIVE
PH UA: 6 (ref 5.0–7.5)
Specific Gravity, UA: >1.03 — ABNORMAL HIGH (ref 1.005–1.030)
Urobilinogen, Ur: 0.2 mg/dL (ref 0.2–1.0)

## 2017-05-27 LAB — MICROSCOPIC EXAMINATION: WBC, UA: NONE SEEN /hpf (ref 0–5)

## 2017-05-27 MED ORDER — SULFAMETHOXAZOLE-TRIMETHOPRIM 400-80 MG PO TABS: 1.0000 | ORAL_TABLET | Freq: Once | ORAL | Status: DC

## 2017-05-27 MED ORDER — LIDOCAINE HCL URETHRAL/MUCOSAL 2 % EX GEL
1.0000 | Freq: Once | CUTANEOUS | Status: AC
Start: 2017-05-27 — End: 2017-05-27
  Administered 2017-05-27: 1 via URETHRAL

## 2017-05-27 NOTE — Progress Notes (Signed)
   05/27/17  CC:  Chief Complaint  Patient presents with  . Cysto    HPI:  Blood pressure 109/74, pulse 96, height 5\' 3"  (1.6 m), weight 78.4 kg (172 lb 12.8 oz). NED. A&Ox3.   No respiratory distress   Abd soft, NT, ND Normal external genitalia with patent urethral meatus  The patient has a hypermobile urethra.  There is no evidence of cystocele.  She has diffuse vaginal wall tenderness.  She has suprapubic tenderness and tenderness in the right inguinal region.  Cystoscopy Procedure Note  Patient identification was confirmed, informed consent was obtained, and patient was prepped using Betadine solution.  Lidocaine jelly was administered per urethral meatus.    Preoperative abx where received prior to procedure.    Procedure: - Flexible cystoscope introduced, without any difficulty.   - Thorough search of the bladder revealed:    normal urethral meatus    normal urothelium    no stones    no ulcers     no tumors    no urethral polyps    no trabeculation  - Ureteral orifices were normal in position and appearance.  Post-Procedure: - Patient tolerated the procedure well  Assessment/ Plan: The patient has no etiology of her gross hematuria.  She does have clear evidence of pelvic floor dysfunction/voiding dysfunction.  I have thus referred the patient to pelvic floor physical therapy for further evaluation and management.  Once she is completed her physical therapy she should return to see Dr. Wendy Poet for further evaluation if she has persistent stress incontinence, she may be a good candidate for a mid urethral sling at some point.   Ardis Hughs, MD

## 2017-05-30 ENCOUNTER — Emergency Department
Admission: EM | Admit: 2017-05-30 | Discharge: 2017-05-30 | Disposition: A | Discharge status: Home / Self Care | Payer: Medicare Other | Attending: Emergency Medicine | Admitting: Emergency Medicine

## 2017-05-30 ENCOUNTER — Encounter: Payer: Self-pay | Admitting: Emergency Medicine

## 2017-05-30 ENCOUNTER — Other Ambulatory Visit: Payer: Self-pay

## 2017-05-30 DIAGNOSIS — I10 Essential (primary) hypertension: Secondary | ICD-10-CM | POA: Insufficient documentation

## 2017-05-30 DIAGNOSIS — Z794 Long term (current) use of insulin: Secondary | ICD-10-CM | POA: Diagnosis not present

## 2017-05-30 DIAGNOSIS — R51 Headache: Secondary | ICD-10-CM | POA: Diagnosis present

## 2017-05-30 DIAGNOSIS — G43009 Migraine without aura, not intractable, without status migrainosus: Secondary | ICD-10-CM

## 2017-05-30 DIAGNOSIS — E119 Type 2 diabetes mellitus without complications: Secondary | ICD-10-CM | POA: Diagnosis not present

## 2017-05-30 DIAGNOSIS — F17210 Nicotine dependence, cigarettes, uncomplicated: Secondary | ICD-10-CM | POA: Insufficient documentation

## 2017-05-30 DIAGNOSIS — Z79899 Other long term (current) drug therapy: Secondary | ICD-10-CM | POA: Diagnosis not present

## 2017-05-30 LAB — GLUCOSE, CAPILLARY: GLUCOSE-CAPILLARY: 95 mg/dL (ref 65–99)

## 2017-05-30 MED ORDER — PROMETHAZINE HCL 25 MG/ML IJ SOLN
12.5000 mg | Freq: Once | INTRAMUSCULAR | Status: AC
  Administered 2017-05-30: 12.5 mg via INTRAMUSCULAR
  Filled 2017-05-30: qty 1

## 2017-05-30 MED ORDER — KETOROLAC TROMETHAMINE 30 MG/ML IJ SOLN
30.0000 mg | Freq: Once | INTRAMUSCULAR | Status: AC
  Administered 2017-05-30: 30 mg via INTRAMUSCULAR
  Filled 2017-05-30: qty 1

## 2017-05-30 NOTE — ED Triage Notes (Signed)
Pt with headache that started at 0300 today. Pt with hx migraines. Pt took promethazine, nortriptyline and maxalt prior to coming to er. Pt states that toradol worked last time.
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
## 2017-05-30 NOTE — Discharge Instructions (Addendum)
You have been seen in the Emergency Department (ED) for a headache.   ° °As we have discussed, please follow up with your primary care doctor as soon as possible regarding today?s Emergency Department (ED) visit and your headache symptoms.   ° °Call your doctor or return to the ED if you have a worsening headache, sudden and severe headache, confusion, slurred speech, facial droop, weakness or numbness in any arm or leg, extreme fatigue, vision problems, or other symptoms that concern you. ° °

## 2017-06-08 ENCOUNTER — Ambulatory Visit: Payer: Medicare Other | Attending: Urology

## 2017-07-03 ENCOUNTER — Encounter: Payer: Self-pay | Admitting: Emergency Medicine

## 2017-07-03 ENCOUNTER — Other Ambulatory Visit: Payer: Self-pay

## 2017-07-03 DIAGNOSIS — R109 Unspecified abdominal pain: Secondary | ICD-10-CM | POA: Insufficient documentation

## 2017-07-03 DIAGNOSIS — R51 Headache: Secondary | ICD-10-CM | POA: Diagnosis not present

## 2017-07-03 DIAGNOSIS — Z5321 Procedure and treatment not carried out due to patient leaving prior to being seen by health care provider: Secondary | ICD-10-CM | POA: Insufficient documentation

## 2017-07-03 LAB — COMPREHENSIVE METABOLIC PANEL
ALBUMIN: 4 g/dL (ref 3.5–5.0)
ALT: 26 U/L (ref 14–54)
ANION GAP: 7 (ref 5–15)
AST: 26 U/L (ref 15–41)
Alkaline Phosphatase: 72 U/L (ref 38–126)
BILIRUBIN TOTAL: 0.2 mg/dL — AB (ref 0.3–1.2)
BUN: 15 mg/dL (ref 6–20)
CO2: 24 mmol/L (ref 22–32)
CREATININE: 0.81 mg/dL (ref 0.44–1.00)
Calcium: 9.3 mg/dL (ref 8.9–10.3)
Chloride: 104 mmol/L (ref 101–111)
GFR calc non Af Amer: >60 mL/min (ref >60)
GLUCOSE: 155 mg/dL — AB (ref 65–99)
Potassium: 3.9 mmol/L (ref 3.5–5.1)
Sodium: 135 mmol/L (ref 135–145)
TOTAL PROTEIN: 7.8 g/dL (ref 6.5–8.1)

## 2017-07-03 LAB — CBC
HCT: 42 % (ref 35.0–47.0)
HEMOGLOBIN: 14.2 g/dL (ref 12.0–16.0)
MCH: 30.8 pg (ref 26.0–34.0)
MCHC: 33.9 g/dL (ref 32.0–36.0)
MCV: 90.8 fL (ref 80.0–100.0)
Platelets: 250 10*3/uL (ref 150–440)
RBC: 4.62 MIL/uL (ref 3.80–5.20)
RDW: 14.2 % (ref 11.5–14.5)
WBC: 11.8 10*3/uL — ABNORMAL HIGH (ref 3.6–11.0)

## 2017-07-03 LAB — LIPASE, BLOOD: Lipase: 25 U/L (ref 11–51)

## 2017-07-03 NOTE — ED Triage Notes (Addendum)
Pt says 3 days ago she ate "a bowl of maggots"; says she had some cereal and later found "spider webs and maggots" in the box; c/o abd pain, N/V/D; says her diarrhea "smells like sewage" and her emesis is white; says she also has  "black blood" coming out of her vagina which "the OB/GYN told me was poisoning my system"-pt was told this sometime in the last week; was wanting to have her tubal ligation reversed to have more children but was told instead she needs a hysterectomy;  pt also c/o migraine headache to which she needs Toradol "bad" as she's taken all the nortriptyline and max-alt she can take;

## 2017-07-03 NOTE — ED Notes (Addendum)
Pt says she's been unable to take her medications for the last 3 days due to nausea but then says she took Maxalt and Nortriptyline today for her migraine that it isn't helping; says she didn't want to take anything else because her throat was burning and she didn't want to throw up; pt says her headache pain is what's currently causing her the most pain

## 2017-07-04 ENCOUNTER — Emergency Department
Admission: EM | Admit: 2017-07-04 | Discharge: 2017-07-04 | Disposition: A | Discharge status: Home / Self Care | Payer: Medicare Other | Attending: Emergency Medicine | Admitting: Emergency Medicine

## 2017-08-30 ENCOUNTER — Ambulatory Visit: Payer: Self-pay | Admitting: Urology

## 2017-08-30 ENCOUNTER — Encounter: Payer: Self-pay | Admitting: Urology

## 2017-11-04 ENCOUNTER — Telehealth: Payer: Self-pay | Admitting: Emergency Medicine

## 2017-11-04 ENCOUNTER — Other Ambulatory Visit: Payer: Self-pay

## 2017-11-04 ENCOUNTER — Emergency Department
Admission: EM | Admit: 2017-11-04 | Discharge: 2017-11-04 | Disposition: A | Discharge status: Home / Self Care | Payer: Medicare Other | Attending: Emergency Medicine | Admitting: Emergency Medicine

## 2017-11-04 ENCOUNTER — Encounter: Payer: Self-pay | Admitting: Emergency Medicine

## 2017-11-04 ENCOUNTER — Emergency Department: Payer: Medicare Other

## 2017-11-04 DIAGNOSIS — M549 Dorsalgia, unspecified: Secondary | ICD-10-CM | POA: Diagnosis not present

## 2017-11-04 DIAGNOSIS — R0789 Other chest pain: Secondary | ICD-10-CM | POA: Diagnosis not present

## 2017-11-04 DIAGNOSIS — Z5321 Procedure and treatment not carried out due to patient leaving prior to being seen by health care provider: Secondary | ICD-10-CM | POA: Insufficient documentation

## 2017-11-04 DIAGNOSIS — R112 Nausea with vomiting, unspecified: Secondary | ICD-10-CM | POA: Diagnosis not present

## 2017-11-04 DIAGNOSIS — M542 Cervicalgia: Secondary | ICD-10-CM | POA: Insufficient documentation

## 2017-11-04 DIAGNOSIS — M25512 Pain in left shoulder: Secondary | ICD-10-CM | POA: Diagnosis not present

## 2017-11-04 LAB — CBC WITH DIFFERENTIAL/PLATELET
BASOS ABS: 0.1 10*3/uL (ref 0–0.1)
Basophils Relative: 1 %
Eosinophils Absolute: 0.1 10*3/uL (ref 0–0.7)
Eosinophils Relative: 1 %
HCT: 38.9 % (ref 35.0–47.0)
Hemoglobin: 13.6 g/dL (ref 12.0–16.0)
LYMPHS ABS: 2.8 10*3/uL (ref 1.0–3.6)
LYMPHS PCT: 27 %
MCH: 31.7 pg (ref 26.0–34.0)
MCHC: 35 g/dL (ref 32.0–36.0)
MCV: 90.6 fL (ref 80.0–100.0)
Monocytes Absolute: 0.5 10*3/uL (ref 0.2–0.9)
Monocytes Relative: 5 %
Neutro Abs: 6.7 10*3/uL — ABNORMAL HIGH (ref 1.4–6.5)
Neutrophils Relative %: 66 %
Platelets: 194 10*3/uL (ref 150–440)
RBC: 4.29 MIL/uL (ref 3.80–5.20)
RDW: 13.4 % (ref 11.5–14.5)
WBC: 10.1 10*3/uL (ref 3.6–11.0)

## 2017-11-04 LAB — COMPREHENSIVE METABOLIC PANEL
ALT: 19 U/L (ref 0–44)
AST: 21 U/L (ref 15–41)
Albumin: 4.1 g/dL (ref 3.5–5.0)
Alkaline Phosphatase: 78 U/L (ref 38–126)
Anion gap: 9 (ref 5–15)
BUN: 7 mg/dL (ref 6–20)
CALCIUM: 8.7 mg/dL — AB (ref 8.9–10.3)
CHLORIDE: 109 mmol/L (ref 98–111)
CO2: 20 mmol/L — AB (ref 22–32)
CREATININE: 0.92 mg/dL (ref 0.44–1.00)
GFR calc non Af Amer: >60 mL/min (ref >60)
Glucose, Bld: 350 mg/dL — ABNORMAL HIGH (ref 70–99)
Potassium: 3.6 mmol/L (ref 3.5–5.1)
SODIUM: 138 mmol/L (ref 135–145)
Total Bilirubin: 0.5 mg/dL (ref 0.3–1.2)
Total Protein: 6.9 g/dL (ref 6.5–8.1)

## 2017-11-04 LAB — TROPONIN I: Troponin I: <0.03 ng/mL (ref <0.03)

## 2017-11-04 NOTE — Telephone Encounter (Signed)
Called patient due to lwot to inquire about condition and follow up plans. Left message.   

## 2017-11-04 NOTE — ED Notes (Signed)
Pt not found in lobby 

## 2017-11-04 NOTE — ED Triage Notes (Signed)
Pt to triage via w/c with no distress noted, brought in by husband; pt c/o midsternal CP radiating into left shoulder, neck & back accomp by N/V

## 2017-11-05 ENCOUNTER — Other Ambulatory Visit: Payer: Self-pay

## 2017-11-05 ENCOUNTER — Encounter: Payer: Self-pay | Admitting: Emergency Medicine

## 2017-11-05 ENCOUNTER — Emergency Department
Admission: EM | Admit: 2017-11-05 | Discharge: 2017-11-05 | Disposition: A | Discharge status: Home / Self Care | Payer: Medicare Other | Attending: Student in an Organized Health Care Education/Training Program | Admitting: Student in an Organized Health Care Education/Training Program

## 2017-11-05 DIAGNOSIS — E119 Type 2 diabetes mellitus without complications: Secondary | ICD-10-CM | POA: Diagnosis not present

## 2017-11-05 DIAGNOSIS — Z79899 Other long term (current) drug therapy: Secondary | ICD-10-CM | POA: Insufficient documentation

## 2017-11-05 DIAGNOSIS — I252 Old myocardial infarction: Secondary | ICD-10-CM | POA: Insufficient documentation

## 2017-11-05 DIAGNOSIS — Z8673 Personal history of transient ischemic attack (TIA), and cerebral infarction without residual deficits: Secondary | ICD-10-CM | POA: Diagnosis not present

## 2017-11-05 DIAGNOSIS — I10 Essential (primary) hypertension: Secondary | ICD-10-CM | POA: Diagnosis not present

## 2017-11-05 DIAGNOSIS — R51 Headache: Secondary | ICD-10-CM | POA: Insufficient documentation

## 2017-11-05 DIAGNOSIS — F17210 Nicotine dependence, cigarettes, uncomplicated: Secondary | ICD-10-CM | POA: Insufficient documentation

## 2017-11-05 DIAGNOSIS — Z794 Long term (current) use of insulin: Secondary | ICD-10-CM | POA: Insufficient documentation

## 2017-11-05 DIAGNOSIS — R519 Headache, unspecified: Secondary | ICD-10-CM

## 2017-11-05 DIAGNOSIS — Z8541 Personal history of malignant neoplasm of cervix uteri: Secondary | ICD-10-CM | POA: Diagnosis not present

## 2017-11-05 DIAGNOSIS — E860 Dehydration: Secondary | ICD-10-CM | POA: Diagnosis present

## 2017-11-05 LAB — BASIC METABOLIC PANEL
Anion gap: 8 (ref 5–15)
BUN: 6 mg/dL (ref 6–20)
CALCIUM: 8.9 mg/dL (ref 8.9–10.3)
CO2: 23 mmol/L (ref 22–32)
CREATININE: 1.02 mg/dL — AB (ref 0.44–1.00)
Chloride: 107 mmol/L (ref 98–111)
GFR calc Af Amer: >60 mL/min (ref >60)
GFR calc non Af Amer: >60 mL/min (ref >60)
Glucose, Bld: 278 mg/dL — ABNORMAL HIGH (ref 70–99)
Potassium: 4.2 mmol/L (ref 3.5–5.1)
Sodium: 138 mmol/L (ref 135–145)

## 2017-11-05 LAB — CBC
HCT: 37.5 % (ref 35.0–47.0)
Hemoglobin: 13.3 g/dL (ref 12.0–16.0)
MCH: 31.8 pg (ref 26.0–34.0)
MCHC: 35.4 g/dL (ref 32.0–36.0)
MCV: 89.9 fL (ref 80.0–100.0)
PLATELETS: 198 10*3/uL (ref 150–440)
RBC: 4.17 MIL/uL (ref 3.80–5.20)
RDW: 13.4 % (ref 11.5–14.5)
WBC: 9.8 10*3/uL (ref 3.6–11.0)

## 2017-11-05 LAB — TROPONIN I

## 2017-11-05 LAB — GLUCOSE, CAPILLARY: Glucose-Capillary: 260 mg/dL — ABNORMAL HIGH (ref 70–99)

## 2017-11-05 MED ORDER — KETOROLAC TROMETHAMINE 30 MG/ML IJ SOLN
30.0000 mg | Freq: Once | INTRAMUSCULAR | Status: AC
  Administered 2017-11-05: 30 mg via INTRAMUSCULAR
  Filled 2017-11-05: qty 1

## 2017-11-05 NOTE — ED Triage Notes (Signed)
Pt presents to ED via POV, states that her PCP called today and told her that her blood sugar was over 400 and that she was dehydrated. Pt states she was told she needed IV fluids. Pt states hx of diabetes, has not been taking insulin for a while, when asked how long pt states "I don't even know". Pt states "I'm stressed out, I'm angry, I don't even want to be here". Pt also c/o jaw, neck, back, and CP, and HA.

## 2017-11-05 NOTE — ED Notes (Signed)
Pt's care discussed with Dr. Jacqualine Code, no chest x-ray at this time.

## 2017-11-05 NOTE — Discharge Instructions (Addendum)

## 2017-11-05 NOTE — ED Provider Notes (Signed)
[facility] Emergency Department Provider Note    First MD Initiated Contact with Patient 11/05/17 1605     (approximate)  I have reviewed the triage vital signs and the nursing notes.   HISTORY  Chief Complaint Dehydrated    HPI Alejandra Frederick is a 47 y.o. female states past medical history presents to the ER at the direction of PCP due to concern for dehydration hyperglycemia and acidosis.  Patient was feeling sick 2 days ago is having trouble will keeping any food down was very stressed out.  She was called by her PCPs office today and directed to return to the ER due to concern for hyperglycemia and acidosis.  Patient states she does not want to be here and feels better.  She is drinking fluids.  Denies any new symptoms.  Does have a chronic headache which is unchanged.  Is requested first some Toradol to help with her headache.  Denies any other pain discomfort or concerns.  Past Medical History:  Diagnosis Date  . Cervical cancer [facility]) 2007   surgical resection to freeze cells.   . Diabetes mellitus without complication ([place])   . High cholesterol   . Hypertension   . Myocardial infarction ([place]) 2016  . Stroke [facility]) 2013   No family history on file. Past Surgical History:  Procedure Laterality Date  . TONSILLECTOMY    . TUBAL LIGATION     Patient Active Problem List   Diagnosis Date Noted  . Bipolar I disorder, most recent episode mixed ([place]) 08/24/2016  . Acute respiratory failure with hypoxia ([place])   . Accidental drug overdose   . Encephalopathy acute 08/22/2016      Prior to Admission medications   Medication Sig Start Date End Date Taking? Authorizing Provider  busPIRone (BUSPAR) 10 MG tablet Take 10 mg by mouth 2 (two) times daily.    [provider]  clonazePAM (KLONOPIN) 2 MG tablet Take 2 mg by mouth 2 (two) times daily. Takes 1 tablet po BID + 1/2 tablet HS (= 2.5 tablets daily)    [provider]    cyclobenzaprine (FLEXERIL) 5 MG tablet Take 5 mg by mouth at bedtime.    [provider]  fenofibrate (TRICOR) 48 MG tablet Take by mouth. 10/15/16 10/15/17  [provider]  Fremanezumab-vfrm (AJOVY) 225 MG/1.5ML SOSY Inject into the skin.    [provider]  insulin aspart (NOVOLOG) 100 UNIT/ML injection 150-200:    2 units,  201-250 :   4 units 251-300:    6 units 301- 350 :    8 units 351-400:    10units 401 :  10 units and call MD 08/28/16   [provider]  Insulin Pen Needle (FIFTY50 PEN NEEDLES) 32G X 4 MM MISC For use with insulin pen 03/19/16   [provider]  metoprolol succinate (TOPROL-XL) 25 MG 24 hr tablet Take by mouth. 10/19/16 10/19/17  [provider]  nortriptyline (PAMELOR) 10 MG capsule Take 10 mg by mouth at bedtime.    [provider]  prazosin (MINIPRESS) 5 MG capsule Take 10 mg by mouth at bedtime.    [provider]  promethazine (PHENERGAN) 12.5 MG tablet Take 1 tablet (12.5 mg total) by mouth every 6 (six) hours as needed for nausea or vomiting. 04/07/17   Gregor Hams, MD  rizatriptan (MAXALT) 10 MG tablet Take 10 mg by mouth as needed for migraine. May repeat in 2 hours if needed  [provider]  topiramate (TOPAMAX) 25 MG tablet Take 50 mg by mouth 2 (two) times daily.    [provider]  traZODone (DESYREL) 150 MG tablet Take by mouth at bedtime.    [provider]    Allergies Bee venom; Ciprofloxacin; and Zofran [ondansetron hcl]    Social History Social History   Tobacco Use  . Smoking status: Current Every Day Smoker    Packs/day: 0.50    Types: Cigarettes  . Smokeless tobacco: Never Used  Substance Use Topics  . Alcohol use: No  . Drug use: No    Review of Systems Patient denies headaches, rhinorrhea, blurry vision, numbness, shortness of breath, chest pain, edema, cough, abdominal pain, nausea, vomiting, diarrhea, dysuria, fevers, rashes or  hallucinations unless otherwise stated above in HPI. ____________________________________________   PHYSICAL EXAM:  VITAL SIGNS: Vitals:   11/05/17 1454  BP: (!) 107/54  Pulse: (!) 108  Resp: 20  Temp: 98.3 F (36.8 C)  SpO2: 97%    Constitutional: Alert and oriented. Well appearing and in no acute distress. Eyes: Conjunctivae are normal.  Head: Atraumatic. Nose: No congestion/rhinnorhea. Mouth/Throat: Mucous membranes are moist.   Neck: Painless ROM.  Cardiovascular:   Good peripheral circulation. Respiratory: Normal respiratory effort.  No retractions.  Gastrointestinal: Soft and nontender.  Musculoskeletal: No lower extremity tenderness .  No joint effusions. Neurologic:  Normal speech and language. No gross focal neurologic deficits are appreciated.  Skin:  Skin is warm, dry and intact. No rash noted. Psychiatric: Mood and affect are normal. Speech and behavior are normal.  ____________________________________________   LABS (all labs ordered are listed, but only abnormal results are displayed)  Results for orders placed or performed during the hospital encounter of 11/05/17 (from the past 24 hour(s))  Glucose, capillary     Status: Abnormal   Collection Time: 11/05/17  2:58 PM  Result Value Ref Range   Glucose-Capillary 260 (H) 70 - 99 mg/dL  Basic metabolic panel     Status: Abnormal   Collection Time: 11/05/17  3:05 PM  Result Value Ref Range   Sodium 138 135 - 145 mmol/L   Potassium 4.2 3.5 - 5.1 mmol/L   Chloride 107 98 - 111 mmol/L   CO2 23 22 - 32 mmol/L   Glucose, Bld 278 (H) 70 - 99 mg/dL   BUN 6 6 - 20 mg/dL   Creatinine, Ser 1.02 (H) 0.44 - 1.00 mg/dL   Calcium 8.9 8.9 - 10.3 mg/dL   GFR calc non Af Amer >60 >60 mL/min   GFR calc Af Amer >60 >60 mL/min   Anion gap 8 5 - 15  CBC     Status: None   Collection Time: 11/05/17  3:05 PM  Result Value Ref Range   WBC 9.8 3.6 - 11.0 K/uL   RBC 4.17 3.80 - 5.20 MIL/uL   Hemoglobin 13.3 12.0 - 16.0  g/dL   HCT 37.5 35.0 - 47.0 %   MCV 89.9 80.0 - 100.0 fL   MCH 31.8 26.0 - 34.0 pg   MCHC 35.4 32.0 - 36.0 g/dL   RDW 13.4 11.5 - 14.5 %   Platelets 198 150 - 440 K/uL  Troponin I     Status: None   Collection Time: 11/05/17  3:05 PM  Result Value Ref Range   Troponin I <0.03 <0.03 ng/mL   ____________________________________________  EKG My review and personal interpretation at Time:  15:01  Indication: dehydration  Rate: 110  Rhythm: sinus  Axis: normal Other: normal intervals, no stemi ____________________________________________  RADIOLOGY  I personally reviewed all radiographic images ordered to evaluate for the above acute complaints and reviewed radiology reports and findings.  These findings were personally discussed with the patient.  Please see medical record for radiology report.  ____________________________________________   PROCEDURES  Procedure(s) performed:  Procedures    Critical Care performed: no ____________________________________________   INITIAL IMPRESSION / ASSESSMENT AND PLAN / ED COURSE  Pertinent labs & imaging results that were available during my care of the patient were reviewed by me and considered in my medical decision making (see chart for details).  DDX: Hydration, DKA, lecture light abnormality, gastritis, migraine, tension  Caty Tessler is a 47 y.o. who presents to the ED with symptoms as described above.  Patient afebrile Heema dynamically stable.  Blood work rechecked and is reassuring.  No evidence of metabolic acidosis.  Patient at her baseline states this consistent with her previous headaches.  Stable and appropriate for outpatient follow-up.      ____________________________________________   FINAL CLINICAL IMPRESSION(S) / ED DIAGNOSES  Final diagnoses:  Chronic intractable headache, unspecified headache type      NEW MEDICATIONS STARTED DURING THIS VISIT:  New Prescriptions   No medications on file      Note:  This document was prepared using Dragon voice recognition software and may include unintentional dictation errors.     Merlyn Lot, MD 11/05/17 [phone]

## 2017-11-06 ENCOUNTER — Encounter: Payer: Self-pay | Admitting: Emergency Medicine

## 2017-11-06 ENCOUNTER — Emergency Department
Admission: EM | Admit: 2017-11-06 | Discharge: 2017-11-06 | Disposition: A | Discharge status: Home / Self Care | Payer: Medicare Other | Attending: Emergency Medicine | Admitting: Emergency Medicine

## 2017-11-06 ENCOUNTER — Other Ambulatory Visit: Payer: Self-pay

## 2017-11-06 DIAGNOSIS — R51 Headache: Secondary | ICD-10-CM | POA: Diagnosis not present

## 2017-11-06 DIAGNOSIS — R12 Heartburn: Secondary | ICD-10-CM | POA: Diagnosis not present

## 2017-11-06 DIAGNOSIS — Z5321 Procedure and treatment not carried out due to patient leaving prior to being seen by health care provider: Secondary | ICD-10-CM | POA: Diagnosis not present

## 2017-11-06 NOTE — ED Notes (Signed)
Pt called spouse reported she does not want to stay to be seen, spouse arrived and left with pt.

## 2017-11-06 NOTE — ED Triage Notes (Signed)
Pt arrives via [facility] with c/o HA x "I don't even know". Pt was just seen here earlier for the same and DC'd. Pt reports that she felt fine upon DC until she ate dinner this evening and started to feel acid reflux. Pt is in NAD.

## 2017-11-25 DIAGNOSIS — F331 Major depressive disorder, recurrent, moderate: Secondary | ICD-10-CM | POA: Insufficient documentation

## 2017-12-06 ENCOUNTER — Encounter: Payer: Self-pay | Admitting: Urology

## 2017-12-06 ENCOUNTER — Ambulatory Visit: Payer: Self-pay | Admitting: Urology

## 2018-09-29 ENCOUNTER — Other Ambulatory Visit: Payer: Self-pay

## 2018-09-29 ENCOUNTER — Encounter
Admission: RE | Admit: 2018-09-29 | Discharge: 2018-09-29 | Disposition: A | Discharge status: Home / Self Care | Payer: Medicare Other | Source: Ambulatory Visit | Attending: Obstetrics & Gynecology | Admitting: Obstetrics & Gynecology

## 2018-09-29 DIAGNOSIS — Z01818 Encounter for other preprocedural examination: Secondary | ICD-10-CM | POA: Insufficient documentation

## 2018-09-29 DIAGNOSIS — Z20828 Contact with and (suspected) exposure to other viral communicable diseases: Secondary | ICD-10-CM | POA: Insufficient documentation

## 2018-09-29 DIAGNOSIS — I10 Essential (primary) hypertension: Secondary | ICD-10-CM | POA: Insufficient documentation

## 2018-09-29 HISTORY — DX: Unspecified asthma, uncomplicated: J45.909

## 2018-09-29 HISTORY — DX: Depression, unspecified: F32.A

## 2018-09-29 HISTORY — DX: Headache, unspecified: R51.9

## 2018-09-29 HISTORY — DX: Gastroparesis: K31.84

## 2018-09-29 HISTORY — DX: Anxiety disorder, unspecified: F41.9

## 2018-09-29 HISTORY — DX: Other pulmonary embolism without acute cor pulmonale: I26.99

## 2018-09-29 HISTORY — DX: Other complications of anesthesia, initial encounter: T88.59XA

## 2018-09-29 LAB — TYPE AND SCREEN
ABO/RH(D): O POS
Antibody Screen: NEGATIVE

## 2018-09-29 LAB — BASIC METABOLIC PANEL
Anion gap: 7 (ref 5–15)
BUN: 9 mg/dL (ref 6–20)
CO2: 23 mmol/L (ref 22–32)
Calcium: 9.2 mg/dL (ref 8.9–10.3)
Chloride: 105 mmol/L (ref 98–111)
Creatinine, Ser: 0.78 mg/dL (ref 0.44–1.00)
GFR calc Af Amer: >60 mL/min (ref >60)
GFR calc non Af Amer: >60 mL/min (ref >60)
Glucose, Bld: 204 mg/dL — ABNORMAL HIGH (ref 70–99)
Potassium: 4.1 mmol/L (ref 3.5–5.1)
Sodium: 135 mmol/L (ref 135–145)

## 2018-09-29 LAB — CBC
HCT: 41.3 % (ref 36.0–46.0)
Hemoglobin: 13.7 g/dL (ref 12.0–15.0)
MCH: 29.5 pg (ref 26.0–34.0)
MCHC: 33.2 g/dL (ref 30.0–36.0)
MCV: 89 fL (ref 80.0–100.0)
Platelets: 271 10*3/uL (ref 150–400)
RBC: 4.64 MIL/uL (ref 3.87–5.11)
RDW: 12.7 % (ref 11.5–15.5)
WBC: 10.9 10*3/uL — ABNORMAL HIGH (ref 4.0–10.5)
nRBC: 0 % (ref 0.0–0.2)

## 2018-09-29 LAB — SARS CORONAVIRUS 2 (TAT 6-24 HRS): SARS Coronavirus 2: NEGATIVE

## 2018-09-29 MED ORDER — ACETAMINOPHEN 500 MG PO TABS: 1000.0000 mg | ORAL_TABLET | Freq: Once | ORAL | Status: DC

## 2018-09-29 NOTE — Patient Instructions (Signed)
Your procedure is scheduled on:Mon 8/24 Report to Day Surgery. To find out your arrival time please call [phone] between 1PM - 3PM on Friday 8/21.  Remember: Instructions that are not followed completely may result in serious medical risk,  up to and including death, or upon the discretion of your surgeon and anesthesiologist your  surgery may need to be rescheduled.     _X__ 1. Do not eat food after midnight the night before your procedure.                 No gum chewing or hard candies. You may drink clear liquids up to 2 hours                 before you are scheduled to arrive for your surgery- DO not drink clear                 liquids within 2 hours of the start of your surgery.                 Clear Liquids include:  water, tea  __X__2.  On the morning of surgery brush your teeth with toothpaste and water, you                may rinse your mouth with mouthwash if you wish.  Do not swallow any toothpaste of mouthwash.     _X__ 3.  No Alcohol for 24 hours before or after surgery.   _X__ 4.  Do Not Smoke or use e-cigarettes For 24 Hours Prior to Your Surgery.                 Do not use any chewable tobacco products for at least 6 hours prior to                 surgery.  ____  5.  Bring all medications with you on the day of surgery if instructed.   _x___  6.  Notify your doctor if there is any change in your medical condition      (cold, fever, infections).     Do not wear jewelry, make-up, hairpins, clips or nail polish. Do not wear lotions, powders, or perfumes. You may wear deodorant. Do not shave 48 hours prior to surgery. Men may shave face and neck. Do not bring valuables to the hospital.    [facility] is not responsible for any belongings or valuables.  Contacts, dentures or bridgework may not be worn into surgery. Leave your suitcase in the car. After surgery it may be brought to your room. For patients admitted to the hospital, discharge  time is determined by your treatment team.   Patients discharged the day of surgery will not be allowed to drive home.   Please read over the following fact sheets that you were given:    __x__ Take these medicines the morning of surgery with A SIP OF WATER:    1. none  2.   3.   4.  5.  6.  ____ Fleet Enema (as directed)   __x__ Use CHG Soap as directed   __x__ Use inhalers on the day of surgery albuterol (VENTOLIN HFA) 108 (90 Base) MCG/ACT inhaler and bring with you   ____ Stop metformin 2 days prior to surgery    __x__ Take 1/2 of usual insulin dose the night before surgery. No insulin the morning          of surgery.   __x__  Stop aspirin today  __x__ Stop Anti-inflammatories ibuprofen (ADVIL) 200 MG tablet, ketorolac (TORADOL) 30 MG/ML injection  May take tylenol   ____ Stop supplements until after surgery.    ____ Bring C-Pap to the hospital.

## 2018-10-02 MED ORDER — CEFAZOLIN SODIUM-DEXTROSE 2-4 GM/100ML-% IV SOLN
2.0000 g | Freq: Once | INTRAVENOUS | Status: AC
  Administered 2018-10-03: 2 g via INTRAVENOUS

## 2018-10-03 ENCOUNTER — Ambulatory Visit
Admission: RE | Admit: 2018-10-03 | Discharge: 2018-10-03 | Disposition: A | Discharge status: Home / Self Care | Payer: Medicare Other | Source: Home / Self Care | Attending: Obstetrics & Gynecology | Admitting: Obstetrics & Gynecology

## 2018-10-03 ENCOUNTER — Other Ambulatory Visit: Payer: Self-pay

## 2018-10-03 ENCOUNTER — Ambulatory Visit: Payer: Medicare Other | Admitting: Certified Registered Nurse Anesthetist

## 2018-10-03 ENCOUNTER — Encounter
Admission: RE | Disposition: A | Discharge status: Home / Self Care | Payer: Self-pay | Source: Home / Self Care | Attending: Obstetrics & Gynecology

## 2018-10-03 ENCOUNTER — Observation Stay
Admission: AD | Admit: 2018-10-03 | Payer: Medicare Other | Source: Ambulatory Visit | Admitting: Obstetrics & Gynecology

## 2018-10-03 ENCOUNTER — Inpatient Hospital Stay
Admission: RE | Admit: 2018-10-03 | Discharge: 2018-10-04 | DRG: 742 | Disposition: A | Discharge status: Home / Self Care | Payer: Medicare Other | Attending: Obstetrics and Gynecology | Admitting: Obstetrics and Gynecology

## 2018-10-03 ENCOUNTER — Emergency Department
Admission: EM | Admit: 2018-10-03 | Discharge: 2018-10-03 | Discharge status: Absent without leave | Payer: Medicare Other | Source: Home / Self Care

## 2018-10-03 DIAGNOSIS — N8110 Cystocele, unspecified: Secondary | ICD-10-CM | POA: Insufficient documentation

## 2018-10-03 DIAGNOSIS — Z7982 Long term (current) use of aspirin: Secondary | ICD-10-CM

## 2018-10-03 DIAGNOSIS — I252 Old myocardial infarction: Secondary | ICD-10-CM

## 2018-10-03 DIAGNOSIS — Z8541 Personal history of malignant neoplasm of cervix uteri: Secondary | ICD-10-CM | POA: Diagnosis not present

## 2018-10-03 DIAGNOSIS — E1143 Type 2 diabetes mellitus with diabetic autonomic (poly)neuropathy: Secondary | ICD-10-CM | POA: Diagnosis present

## 2018-10-03 DIAGNOSIS — J45909 Unspecified asthma, uncomplicated: Secondary | ICD-10-CM | POA: Insufficient documentation

## 2018-10-03 DIAGNOSIS — R569 Unspecified convulsions: Secondary | ICD-10-CM | POA: Insufficient documentation

## 2018-10-03 DIAGNOSIS — Z794 Long term (current) use of insulin: Secondary | ICD-10-CM | POA: Insufficient documentation

## 2018-10-03 DIAGNOSIS — F3160 Bipolar disorder, current episode mixed, unspecified: Secondary | ICD-10-CM | POA: Diagnosis present

## 2018-10-03 DIAGNOSIS — R102 Pelvic and perineal pain: Secondary | ICD-10-CM | POA: Diagnosis present

## 2018-10-03 DIAGNOSIS — N938 Other specified abnormal uterine and vaginal bleeding: Secondary | ICD-10-CM | POA: Insufficient documentation

## 2018-10-03 DIAGNOSIS — F17210 Nicotine dependence, cigarettes, uncomplicated: Secondary | ICD-10-CM | POA: Diagnosis present

## 2018-10-03 DIAGNOSIS — R338 Other retention of urine: Secondary | ICD-10-CM

## 2018-10-03 DIAGNOSIS — I10 Essential (primary) hypertension: Secondary | ICD-10-CM | POA: Diagnosis present

## 2018-10-03 DIAGNOSIS — R339 Retention of urine, unspecified: Secondary | ICD-10-CM | POA: Diagnosis not present

## 2018-10-03 DIAGNOSIS — Z8673 Personal history of transient ischemic attack (TIA), and cerebral infarction without residual deficits: Secondary | ICD-10-CM | POA: Diagnosis not present

## 2018-10-03 DIAGNOSIS — I2510 Atherosclerotic heart disease of native coronary artery without angina pectoris: Secondary | ICD-10-CM | POA: Insufficient documentation

## 2018-10-03 DIAGNOSIS — K3184 Gastroparesis: Secondary | ICD-10-CM | POA: Diagnosis present

## 2018-10-03 DIAGNOSIS — Z86711 Personal history of pulmonary embolism: Secondary | ICD-10-CM

## 2018-10-03 DIAGNOSIS — N838 Other noninflammatory disorders of ovary, fallopian tube and broad ligament: Secondary | ICD-10-CM | POA: Insufficient documentation

## 2018-10-03 DIAGNOSIS — N736 Female pelvic peritoneal adhesions (postinfective): Secondary | ICD-10-CM | POA: Insufficient documentation

## 2018-10-03 DIAGNOSIS — Z20828 Contact with and (suspected) exposure to other viral communicable diseases: Secondary | ICD-10-CM | POA: Diagnosis present

## 2018-10-03 DIAGNOSIS — Z79899 Other long term (current) drug therapy: Secondary | ICD-10-CM

## 2018-10-03 DIAGNOSIS — G8918 Other acute postprocedural pain: Secondary | ICD-10-CM | POA: Diagnosis present

## 2018-10-03 DIAGNOSIS — F329 Major depressive disorder, single episode, unspecified: Secondary | ICD-10-CM | POA: Insufficient documentation

## 2018-10-03 HISTORY — PX: LAPAROSCOPIC HYSTERECTOMY: SHX1926

## 2018-10-03 HISTORY — PX: CYSTOCELE REPAIR: SHX163

## 2018-10-03 LAB — CBC WITH DIFFERENTIAL/PLATELET
Abs Immature Granulocytes: 0.08 10*3/uL — ABNORMAL HIGH (ref 0.00–0.07)
Basophils Absolute: 0 10*3/uL (ref 0.0–0.1)
Basophils Relative: 0 %
Eosinophils Absolute: 0 10*3/uL (ref 0.0–0.5)
Eosinophils Relative: 0 %
HCT: 34.6 % — ABNORMAL LOW (ref 36.0–46.0)
Hemoglobin: 11.7 g/dL — ABNORMAL LOW (ref 12.0–15.0)
Immature Granulocytes: 0 %
Lymphocytes Relative: 6 %
Lymphs Abs: 1.2 10*3/uL (ref 0.7–4.0)
MCH: 29.8 pg (ref 26.0–34.0)
MCHC: 33.8 g/dL (ref 30.0–36.0)
MCV: 88 fL (ref 80.0–100.0)
Monocytes Absolute: 0.4 10*3/uL (ref 0.1–1.0)
Monocytes Relative: 2 %
Neutro Abs: 16.9 10*3/uL — ABNORMAL HIGH (ref 1.7–7.7)
Neutrophils Relative %: 92 %
Platelets: 262 10*3/uL (ref 150–400)
RBC: 3.93 MIL/uL (ref 3.87–5.11)
RDW: 12.6 % (ref 11.5–15.5)
WBC: 18.6 10*3/uL — ABNORMAL HIGH (ref 4.0–10.5)
nRBC: 0 % (ref 0.0–0.2)

## 2018-10-03 LAB — BASIC METABOLIC PANEL
Anion gap: 9 (ref 5–15)
BUN: 13 mg/dL (ref 6–20)
CO2: 18 mmol/L — ABNORMAL LOW (ref 22–32)
Calcium: 8.7 mg/dL — ABNORMAL LOW (ref 8.9–10.3)
Chloride: 107 mmol/L (ref 98–111)
Creatinine, Ser: 1 mg/dL (ref 0.44–1.00)
GFR calc Af Amer: >60 mL/min (ref >60)
GFR calc non Af Amer: >60 mL/min (ref >60)
Glucose, Bld: 272 mg/dL — ABNORMAL HIGH (ref 70–99)
Potassium: 4.9 mmol/L (ref 3.5–5.1)
Sodium: 134 mmol/L — ABNORMAL LOW (ref 135–145)

## 2018-10-03 LAB — GLUCOSE, CAPILLARY
Glucose-Capillary: 218 mg/dL — ABNORMAL HIGH (ref 70–99)
Glucose-Capillary: 252 mg/dL — ABNORMAL HIGH (ref 70–99)

## 2018-10-03 LAB — ABO/RH: ABO/RH(D): O POS

## 2018-10-03 LAB — POCT PREGNANCY, URINE: Preg Test, Ur: NEGATIVE

## 2018-10-03 SURGERY — HYSTERECTOMY, TOTAL, LAPAROSCOPIC
Anesthesia: General

## 2018-10-03 MED ORDER — SUGAMMADEX SODIUM 200 MG/2ML IV SOLN
INTRAVENOUS | Status: AC
  Filled 2018-10-03: qty 2

## 2018-10-03 MED ORDER — PHENYLEPHRINE HCL (PRESSORS) 10 MG/ML IV SOLN
INTRAVENOUS | Status: AC
  Filled 2018-10-03: qty 1

## 2018-10-03 MED ORDER — GABAPENTIN 300 MG PO CAPS
600.0000 mg | ORAL_CAPSULE | Freq: Once | ORAL | Status: AC
  Administered 2018-10-03: 600 mg via ORAL

## 2018-10-03 MED ORDER — MENTHOL 3 MG MT LOZG: 1.0000 | LOZENGE | OROMUCOSAL | Status: DC | PRN

## 2018-10-03 MED ORDER — ONDANSETRON HCL 4 MG/2ML IJ SOLN
INTRAMUSCULAR | Status: AC
  Filled 2018-10-03: qty 2

## 2018-10-03 MED ORDER — LIDOCAINE-EPINEPHRINE 1 %-1:100000 IJ SOLN
INTRAMUSCULAR | Status: DC | PRN
  Administered 2018-10-03: 20 mL

## 2018-10-03 MED ORDER — PHENAZOPYRIDINE HCL 200 MG PO TABS: 200.0000 mg | ORAL_TABLET | Freq: Three times a day (TID) | ORAL | 0 refills | Status: DC

## 2018-10-03 MED ORDER — CELECOXIB 200 MG PO CAPS
400.0000 mg | ORAL_CAPSULE | Freq: Once | ORAL | Status: AC
  Administered 2018-10-03: 400 mg via ORAL

## 2018-10-03 MED ORDER — DIPHENHYDRAMINE HCL 25 MG PO CAPS: 25.0000 mg | ORAL_CAPSULE | Freq: Four times a day (QID) | ORAL | Status: DC | PRN

## 2018-10-03 MED ORDER — SIMETHICONE 80 MG PO CHEW: 80.0000 mg | CHEWABLE_TABLET | Freq: Four times a day (QID) | ORAL | Status: DC | PRN

## 2018-10-03 MED ORDER — IBUPROFEN 800 MG PO TABS: 800.0000 mg | ORAL_TABLET | Freq: Three times a day (TID) | ORAL | Status: DC

## 2018-10-03 MED ORDER — SCOPOLAMINE 1 MG/3DAYS TD PT72
1.0000 | MEDICATED_PATCH | TRANSDERMAL | Status: DC
  Administered 2018-10-03: 09:00:00 1.5 mg via TRANSDERMAL

## 2018-10-03 MED ORDER — MIDAZOLAM HCL 2 MG/2ML IJ SOLN
INTRAMUSCULAR | Status: AC
  Filled 2018-10-03: qty 2

## 2018-10-03 MED ORDER — DEXAMETHASONE SODIUM PHOSPHATE 10 MG/ML IJ SOLN
INTRAMUSCULAR | Status: AC
  Filled 2018-10-03: qty 1

## 2018-10-03 MED ORDER — LIDOCAINE-EPINEPHRINE (PF) 1 %-1:200000 IJ SOLN
INTRAMUSCULAR | Status: AC
  Filled 2018-10-03: qty 30

## 2018-10-03 MED ORDER — GABAPENTIN 300 MG PO CAPS: 400.0000 mg | ORAL_CAPSULE | Freq: Every day | ORAL | Status: DC

## 2018-10-03 MED ORDER — FENTANYL CITRATE (PF) 100 MCG/2ML IJ SOLN
INTRAMUSCULAR | Status: AC
  Administered 2018-10-03: 13:00:00 25 ug via INTRAVENOUS
  Filled 2018-10-03: qty 2

## 2018-10-03 MED ORDER — SODIUM CHLORIDE FLUSH 0.9 % IV SOLN
INTRAVENOUS | Status: AC
  Filled 2018-10-03: qty 10

## 2018-10-03 MED ORDER — OXYCODONE HCL 5 MG PO TABS
ORAL_TABLET | ORAL | Status: AC
  Administered 2018-10-03: 5 mg via ORAL
  Filled 2018-10-03: qty 1

## 2018-10-03 MED ORDER — IBUPROFEN 800 MG PO TABS: 800.0000 mg | ORAL_TABLET | Freq: Four times a day (QID) | ORAL | 1 refills | Status: DC

## 2018-10-03 MED ORDER — KETOROLAC TROMETHAMINE 30 MG/ML IJ SOLN
30.0000 mg | Freq: Four times a day (QID) | INTRAMUSCULAR | Status: DC
  Administered 2018-10-03: 30 mg via INTRAVENOUS

## 2018-10-03 MED ORDER — ALUM & MAG HYDROXIDE-SIMETH 200-200-20 MG/5ML PO SUSP: 30.0000 mL | ORAL | Status: DC | PRN

## 2018-10-03 MED ORDER — KETOROLAC TROMETHAMINE 30 MG/ML IJ SOLN: 30.0000 mg | Freq: Once | INTRAMUSCULAR | Status: DC

## 2018-10-03 MED ORDER — LACTATED RINGERS IV SOLN: INTRAVENOUS | Status: DC

## 2018-10-03 MED ORDER — SENNA 8.6 MG PO TABS
1.0000 | ORAL_TABLET | Freq: Two times a day (BID) | ORAL | Status: DC
  Administered 2018-10-04: 8.6 mg via ORAL
  Filled 2018-10-03 (×3): qty 1

## 2018-10-03 MED ORDER — MORPHINE SULFATE (PF) 4 MG/ML IV SOLN: 1.0000 mg | INTRAVENOUS | Status: DC | PRN

## 2018-10-03 MED ORDER — ACETAMINOPHEN 500 MG PO TABS
1000.0000 mg | ORAL_TABLET | Freq: Once | ORAL | Status: AC
  Administered 2018-10-03: 1000 mg via ORAL

## 2018-10-03 MED ORDER — DOCUSATE SODIUM 50 MG PO CAPS: 100.0000 mg | ORAL_CAPSULE | Freq: Two times a day (BID) | ORAL | Status: DC

## 2018-10-03 MED ORDER — FENTANYL CITRATE (PF) 250 MCG/5ML IJ SOLN
INTRAMUSCULAR | Status: AC
  Filled 2018-10-03: qty 5

## 2018-10-03 MED ORDER — FAMOTIDINE 20 MG PO TABS
20.0000 mg | ORAL_TABLET | Freq: Once | ORAL | Status: AC
  Administered 2018-10-03: 09:00:00 20 mg via ORAL

## 2018-10-03 MED ORDER — ACETAMINOPHEN 500 MG PO TABS
1000.0000 mg | ORAL_TABLET | Freq: Three times a day (TID) | ORAL | Status: DC
  Administered 2018-10-03 – 2018-10-04 (×2): 1000 mg via ORAL
  Filled 2018-10-03 (×2): qty 2

## 2018-10-03 MED ORDER — INSULIN ASPART 100 UNIT/ML ~~LOC~~ SOLN
0.0000 [IU] | SUBCUTANEOUS | Status: DC
  Administered 2018-10-03: 23:00:00 8 [IU] via SUBCUTANEOUS
  Administered 2018-10-04 (×2): 3 [IU] via SUBCUTANEOUS
  Filled 2018-10-03 (×3): qty 1

## 2018-10-03 MED ORDER — HEPARIN SODIUM (PORCINE) 5000 UNIT/ML IJ SOLN
INTRAMUSCULAR | Status: AC
  Administered 2018-10-03: 09:00:00 5000 [IU] via SUBCUTANEOUS
  Filled 2018-10-03: qty 1

## 2018-10-03 MED ORDER — ACETAMINOPHEN 500 MG PO TABS
ORAL_TABLET | ORAL | Status: AC
  Filled 2018-10-03: qty 2

## 2018-10-03 MED ORDER — CELECOXIB 200 MG PO CAPS
ORAL_CAPSULE | ORAL | Status: AC
  Filled 2018-10-03: qty 2

## 2018-10-03 MED ORDER — LIDOCAINE HCL (PF) 2 % IJ SOLN
INTRAMUSCULAR | Status: AC
  Filled 2018-10-03: qty 10

## 2018-10-03 MED ORDER — PROPOFOL 10 MG/ML IV BOLUS
INTRAVENOUS | Status: DC | PRN
  Administered 2018-10-03: 150 mg via INTRAVENOUS

## 2018-10-03 MED ORDER — ROCURONIUM BROMIDE 50 MG/5ML IV SOLN
INTRAVENOUS | Status: AC
  Filled 2018-10-03: qty 1

## 2018-10-03 MED ORDER — OXYCODONE HCL 5 MG PO TABS
5.0000 mg | ORAL_TABLET | Freq: Once | ORAL | Status: AC | PRN
  Administered 2018-10-03: 14:00:00 5 mg via ORAL

## 2018-10-03 MED ORDER — FENTANYL CITRATE (PF) 100 MCG/2ML IJ SOLN
25.0000 ug | INTRAMUSCULAR | Status: DC | PRN
  Administered 2018-10-03 (×2): 25 ug via INTRAVENOUS

## 2018-10-03 MED ORDER — FENTANYL CITRATE (PF) 100 MCG/2ML IJ SOLN
INTRAMUSCULAR | Status: AC
  Administered 2018-10-03: 25 ug via INTRAVENOUS
  Filled 2018-10-03: qty 2

## 2018-10-03 MED ORDER — KETOROLAC TROMETHAMINE 30 MG/ML IJ SOLN
INTRAMUSCULAR | Status: AC
  Administered 2018-10-03: 30 mg via INTRAVENOUS
  Filled 2018-10-03: qty 1

## 2018-10-03 MED ORDER — SODIUM CHLORIDE 0.9 % IV SOLN
INTRAVENOUS | Status: DC
  Administered 2018-10-03 (×2): via INTRAVENOUS

## 2018-10-03 MED ORDER — PROPOFOL 10 MG/ML IV BOLUS
INTRAVENOUS | Status: AC
  Filled 2018-10-03: qty 20

## 2018-10-03 MED ORDER — PHENYLEPHRINE HCL (PRESSORS) 10 MG/ML IV SOLN
INTRAVENOUS | Status: DC | PRN
  Administered 2018-10-03 (×2): 200 ug via INTRAVENOUS
  Administered 2018-10-03 (×5): 100 ug via INTRAVENOUS

## 2018-10-03 MED ORDER — FLEET ENEMA 7-19 GM/118ML RE ENEM: 1.0000 | ENEMA | Freq: Once | RECTAL | Status: DC | PRN

## 2018-10-03 MED ORDER — FAMOTIDINE 20 MG PO TABS
ORAL_TABLET | ORAL | Status: AC
  Filled 2018-10-03: qty 1

## 2018-10-03 MED ORDER — GABAPENTIN 300 MG PO CAPS
ORAL_CAPSULE | ORAL | Status: AC
  Filled 2018-10-03: qty 2

## 2018-10-03 MED ORDER — KETOROLAC TROMETHAMINE 30 MG/ML IJ SOLN
30.0000 mg | Freq: Four times a day (QID) | INTRAMUSCULAR | Status: DC | PRN
  Administered 2018-10-04: 02:00:00 30 mg via INTRAVENOUS
  Filled 2018-10-03: qty 1

## 2018-10-03 MED ORDER — ROCURONIUM BROMIDE 100 MG/10ML IV SOLN
INTRAVENOUS | Status: DC | PRN
  Administered 2018-10-03: 40 mg via INTRAVENOUS
  Administered 2018-10-03: 20 mg via INTRAVENOUS
  Administered 2018-10-03 (×2): 10 mg via INTRAVENOUS

## 2018-10-03 MED ORDER — MORPHINE SULFATE 15 MG PO TABS: 15.0000 mg | ORAL_TABLET | ORAL | 0 refills | Status: DC | PRN

## 2018-10-03 MED ORDER — SUGAMMADEX SODIUM 200 MG/2ML IV SOLN
INTRAVENOUS | Status: DC | PRN
  Administered 2018-10-03: 160 mg via INTRAVENOUS

## 2018-10-03 MED ORDER — PROMETHAZINE HCL 25 MG/ML IJ SOLN
INTRAMUSCULAR | Status: AC
  Filled 2018-10-03: qty 1

## 2018-10-03 MED ORDER — PHENAZOPYRIDINE HCL 200 MG PO TABS
200.0000 mg | ORAL_TABLET | Freq: Three times a day (TID) | ORAL | Status: DC
  Filled 2018-10-03: qty 1

## 2018-10-03 MED ORDER — MIDAZOLAM HCL 2 MG/2ML IJ SOLN
INTRAMUSCULAR | Status: DC | PRN
  Administered 2018-10-03: 2 mg via INTRAVENOUS

## 2018-10-03 MED ORDER — SODIUM CHLORIDE 0.9 % IV SOLN: INTRAVENOUS | Status: DC

## 2018-10-03 MED ORDER — FENTANYL CITRATE (PF) 100 MCG/2ML IJ SOLN
INTRAMUSCULAR | Status: DC | PRN
  Administered 2018-10-03 (×2): 100 ug via INTRAVENOUS
  Administered 2018-10-03: 50 ug via INTRAVENOUS

## 2018-10-03 MED ORDER — LIDOCAINE-EPINEPHRINE 1 %-1:100000 IJ SOLN
INTRAMUSCULAR | Status: AC
  Filled 2018-10-03: qty 1

## 2018-10-03 MED ORDER — OXYCODONE HCL 5 MG/5ML PO SOLN: 5.0000 mg | Freq: Once | ORAL | Status: AC | PRN

## 2018-10-03 MED ORDER — CEFAZOLIN SODIUM-DEXTROSE 2-4 GM/100ML-% IV SOLN
INTRAVENOUS | Status: AC
  Filled 2018-10-03: qty 100

## 2018-10-03 MED ORDER — BISACODYL 10 MG RE SUPP: 10.0000 mg | Freq: Every day | RECTAL | Status: DC | PRN

## 2018-10-03 MED ORDER — LIDOCAINE HCL (CARDIAC) PF 100 MG/5ML IV SOSY
PREFILLED_SYRINGE | INTRAVENOUS | Status: DC | PRN
  Administered 2018-10-03: 80 mg via INTRAVENOUS

## 2018-10-03 MED ORDER — DEXAMETHASONE SODIUM PHOSPHATE 10 MG/ML IJ SOLN
INTRAMUSCULAR | Status: DC | PRN
  Administered 2018-10-03: 10 mg via INTRAVENOUS

## 2018-10-03 MED ORDER — SCOPOLAMINE 1 MG/3DAYS TD PT72
MEDICATED_PATCH | TRANSDERMAL | Status: AC
  Administered 2018-10-03: 1.5 mg via TRANSDERMAL
  Filled 2018-10-03: qty 1

## 2018-10-03 MED ORDER — HEPARIN SODIUM (PORCINE) 5000 UNIT/ML IJ SOLN
5000.0000 [IU] | Freq: Once | INTRAMUSCULAR | Status: AC
  Administered 2018-10-03: 09:00:00 5000 [IU] via SUBCUTANEOUS

## 2018-10-03 MED ORDER — MEPERIDINE HCL 50 MG/ML IJ SOLN: 6.2500 mg | INTRAMUSCULAR | Status: DC | PRN

## 2018-10-03 MED ORDER — FENTANYL CITRATE (PF) 100 MCG/2ML IJ SOLN
25.0000 ug | INTRAMUSCULAR | Status: DC | PRN
  Administered 2018-10-03: 25 ug via INTRAVENOUS
  Administered 2018-10-03: 50 ug via INTRAVENOUS
  Administered 2018-10-03 (×3): 25 ug via INTRAVENOUS

## 2018-10-03 MED ORDER — EPHEDRINE SULFATE 50 MG/ML IJ SOLN
INTRAMUSCULAR | Status: AC
  Filled 2018-10-03: qty 1

## 2018-10-03 MED ORDER — PROMETHAZINE HCL 25 MG/ML IJ SOLN
6.2500 mg | INTRAMUSCULAR | Status: DC | PRN
  Administered 2018-10-03: 6.25 mg via INTRAVENOUS

## 2018-10-03 MED ORDER — EPHEDRINE SULFATE 50 MG/ML IJ SOLN
INTRAMUSCULAR | Status: DC | PRN
  Administered 2018-10-03: 10 mg via INTRAVENOUS

## 2018-10-03 MED ORDER — ESTROGENS, CONJUGATED 0.625 MG/GM VA CREA
TOPICAL_CREAM | VAGINAL | Status: AC
  Filled 2018-10-03: qty 30

## 2018-10-03 MED ORDER — SODIUM CHLORIDE 0.9 % IV SOLN
INTRAVENOUS | Status: DC | PRN
  Administered 2018-10-03: 50 ug/min via INTRAVENOUS

## 2018-10-03 SURGICAL SUPPLY — 71 items
BAG URINE DRAINAGE (UROLOGICAL SUPPLIES) ×2 IMPLANT
BLADE SURG SZ10 CARB STEEL (BLADE) ×1 IMPLANT
BLADE SURG SZ11 CARB STEEL (BLADE) ×2 IMPLANT
CANISTER SUCT 1200ML W/VALVE (MISCELLANEOUS) ×2 IMPLANT
CATH FOLEY 2WAY  5CC 16FR (CATHETERS) ×1
CATH URTH 16FR FL 2W BLN LF (CATHETERS) ×1 IMPLANT
CHLORAPREP W/TINT 26 (MISCELLANEOUS) ×3 IMPLANT
COUNTER NEEDLE 20/40 LG (NEEDLE) ×2 IMPLANT
COVER WAND RF STERILE (DRAPES) ×2 IMPLANT
DEFOGGER SCOPE WARMER CLEARIFY (MISCELLANEOUS) ×2 IMPLANT
DERMABOND ADVANCED (GAUZE/BANDAGES/DRESSINGS) ×1
DERMABOND ADVANCED .7 DNX12 (GAUZE/BANDAGES/DRESSINGS) ×1 IMPLANT
DEVICE SUTURE ENDOST 10MM (ENDOMECHANICALS) IMPLANT
DRAPE 3/4 80X56 (DRAPES) ×2 IMPLANT
DRAPE LEGGINS SURG 28X43 STRL (DRAPES) ×2 IMPLANT
DRAPE PERI LITHO V/GYN (MISCELLANEOUS) ×2 IMPLANT
DRAPE UNDER BUTTOCK W/FLU (DRAPES) ×2 IMPLANT
ELECT REM PT RETURN 9FT ADLT (ELECTROSURGICAL) ×2
ELECTRODE REM PT RTRN 9FT ADLT (ELECTROSURGICAL) ×1 IMPLANT
GAUZE 4X4 16PLY RFD (DISPOSABLE) ×2 IMPLANT
GAUZE PACK 2X3YD (GAUZE/BANDAGES/DRESSINGS) ×2 IMPLANT
GAUZE SPONGE 4X4 16PLY XRAY LF (GAUZE/BANDAGES/DRESSINGS) ×2 IMPLANT
GLOVE PI ORTHOPRO 6.5 (GLOVE) ×1
GLOVE PI ORTHOPRO STRL 6.5 (GLOVE) ×1 IMPLANT
GLOVE SURG SYN 6.5 ES PF (GLOVE) ×6 IMPLANT
GLOVE SURG SYN 6.5 PF PI (GLOVE) ×3 IMPLANT
GOWN STRL REUS W/ TWL LRG LVL3 (GOWN DISPOSABLE) ×3 IMPLANT
GOWN STRL REUS W/ TWL XL LVL3 (GOWN DISPOSABLE) ×1 IMPLANT
GOWN STRL REUS W/TWL LRG LVL3 (GOWN DISPOSABLE) ×2
GOWN STRL REUS W/TWL XL LVL3 (GOWN DISPOSABLE) ×1
IRRIGATION STRYKERFLOW (MISCELLANEOUS) IMPLANT
IRRIGATOR STRYKERFLOW (MISCELLANEOUS) ×2
IV LACTATED RINGERS 1000ML (IV SOLUTION) IMPLANT
KIT PINK PAD W/HEAD ARE REST (MISCELLANEOUS) ×2
KIT PINK PAD W/HEAD ARM REST (MISCELLANEOUS) ×1 IMPLANT
KIT TURNOVER CYSTO (KITS) ×2 IMPLANT
L-HOOK LAP DISP 36CM (ELECTROSURGICAL) ×2
LABEL OR SOLS (LABEL) ×1 IMPLANT
LHOOK LAP DISP 36CM (ELECTROSURGICAL) IMPLANT
LIGASURE VESSEL 5MM BLUNT TIP (ELECTROSURGICAL) ×1 IMPLANT
MANIPULATOR VCARE LG CRV RETR (MISCELLANEOUS) IMPLANT
MANIPULATOR VCARE SML CRV RETR (MISCELLANEOUS) IMPLANT
MANIPULATOR VCARE STD CRV RETR (MISCELLANEOUS) ×1 IMPLANT
NEEDLE HYPO 22GX1.5 SAFETY (NEEDLE) ×2 IMPLANT
NS IRRIG 500ML POUR BTL (IV SOLUTION) ×2 IMPLANT
PACK BASIN MINOR ARMC (MISCELLANEOUS) ×2 IMPLANT
PACK LAP CHOLECYSTECTOMY (MISCELLANEOUS) ×2 IMPLANT
PAD OB MATERNITY 4.3X12.25 (PERSONAL CARE ITEMS) ×2 IMPLANT
PAD PREP 24X41 OB/GYN DISP (PERSONAL CARE ITEMS) ×2 IMPLANT
PENCIL ELECTRO HAND CTR (MISCELLANEOUS) ×2 IMPLANT
SCRUB CHG 4% DYNA-HEX 4OZ (MISCELLANEOUS) ×2 IMPLANT
SET CYSTO W/LG BORE CLAMP LF (SET/KITS/TRAYS/PACK) IMPLANT
SET YANKAUER POOLE SUCT (MISCELLANEOUS) ×2 IMPLANT
SLEEVE ENDOPATH XCEL 5M (ENDOMECHANICALS) ×4 IMPLANT
SURGILUBE 2OZ TUBE FLIPTOP (MISCELLANEOUS) ×2 IMPLANT
SUT ENDO VLOC 180-0-8IN (SUTURE) IMPLANT
SUT ETHIBOND NAB CT1 #1 30IN (SUTURE) ×8 IMPLANT
SUT MNCRL 4-0 (SUTURE) ×1
SUT MNCRL 4-0 27XMFL (SUTURE) ×1
SUT VIC AB 0 CT1 27 (SUTURE) ×1
SUT VIC AB 0 CT1 27XCR 8 STRN (SUTURE) ×1 IMPLANT
SUT VIC AB 0 CT1 36 (SUTURE) ×5 IMPLANT
SUT VIC AB 2-0 CT1 27 (SUTURE) ×1
SUT VIC AB 2-0 CT1 TAPERPNT 27 (SUTURE) ×1 IMPLANT
SUT VIC AB 3-0 SH 27 (SUTURE) ×1
SUT VIC AB 3-0 SH 27X BRD (SUTURE) ×1 IMPLANT
SUTURE MNCRL 4-0 27XMF (SUTURE) ×1 IMPLANT
SYR 10ML LL (SYRINGE) ×2 IMPLANT
SYR CONTROL 10ML LL (SYRINGE) ×2 IMPLANT
TROCAR XCEL NON-BLD 5MMX100MML (ENDOMECHANICALS) ×2 IMPLANT
TUBING EVAC SMOKE HEATED PNEUM (TUBING) ×2 IMPLANT

## 2018-10-03 NOTE — Op Note (Addendum)
Total Laparoscopic Hysterectomy Operative Note Procedure Date: 10/03/2018  Patient:  Alejandra Frederick  48 y.o. female  PRE-OPERATIVE DIAGNOSIS:  dysfunctional uterine bleeding; cystocele  POST-OPERATIVE DIAGNOSIS:  dysfunctional uterine bleeding; cystocele  PROCEDURE:  Procedure(s): HYSTERECTOMY TOTAL LAPAROSCOPIC, bilateral Salpingectomy (N/A) Anterior Colporrhaphy and Culdoplasty (N/A)  SURGEON:  Surgeon(s) and Role:    * Ward, Honor Loh, MD - Primary    * Schermerhorn, Gwen Her, MD - Assisting  ANESTHESIA:  General via ET  I/O  Total I/O In: 1400 [I.V.:1400] Out: 425 [Urine:325; Blood:100]  FINDINGS:   Small uterus, normal ovaries and fallopian tubes with Filshie clips bilaterally.  Normal upper abdomen. Small omental adhesion to right Filshie clip, left sigmoid adhesions to left pelvic side wall. Grade 2 cystocele  SPECIMEN: Uterus, Cervix, and bilateral fallopian tubes  COMPLICATIONS: none apparent  DISPOSITION: vital signs stable to PACU  Indication for Surgery: 47 y.o. [id number] with complaints of pelvic pain, abnormal bleeding, endometrial biopsy negative for hyperplasia or carcinoma.  Risks of surgery were discussed with the patient including but not limited to: bleeding which may require transfusion or reoperation; infection which may require antibiotics; injury to bowel, bladder, ureters or other surrounding organs; need for additional procedures including laparotomy, blood clot, incisional problems and other postoperative/anesthesia complications. Written informed consent was obtained.      PROCEDURE IN DETAIL:  The patient had 5000u Heparin Sub-q and sequential compression devices applied to her lower extremities while in the preoperative area.  She was then taken to the operating room. IV antibiotics were given. General anesthesia was administered via endotracheal route.  She was placed in the dorsal lithotomy position, and was prepped and draped in a sterile manner. A  surgical time-out was performed.  A Foley catheter was inserted into her bladder and attached to constant drainage and a V-Care uterine manipulator was then advanced into the uterus and a good fit around the cervix was noted. The gloves were changed, and attention was turned to the abdomen where an umbilical incision was made with the scalpel.  A 82mm trochar was inserted in the umbilical incision using a visiport method.Opening pressure was 8mmHg, and the abdomen was insufflated to 15mg Hg carbon dioxide gas and adequate pneumoperitoneum was obtained. A survey of the patient's pelvis and abdomen revealed the findings as mentioned above. Two 36mm ports were inserted in the lower left and right quadrants under visualization.    The bilateral fallopian tubes were separated from the mesosalpinx using the Ligasure. The bilateral round ligaments were transected and anterior broad ligament divided and brought across the uterus to separate the vesicouterine peritoneum and create a bladder flap. The bladder was pushed away from the uterus. The bilateral uterine arteries were skeletonized, ligated and transected. The bilateral uterosacral and cardinal ligaments were ligated and transected. A colpotomy was made around the V-Care cervical cup and the uterus, cervix, and bilateral tubes were removed through the vagina.   A whip stitch was placed along the posterior cuff for hemostasis while the remainder of the case was performed.  Alis clamps were placed on the anterior cuff and it was hydrodissected with lidocaine with epinephrine.  Metzenbaums were used to separate the vaginal epithelium from the bladder.  This was divided midline, then the bladder tissues were separated sharply and bluntly.  0-vicryl was used to plicate the bladder in interrupted horizontal mattress sutures, and then the epithelium was trimmed bilaterally. 0-Vicryl was used then to reapproximate the vaginal epithelium midline.  0-Vicryl was used to  plicate  the uterosacral ligaments in a modified McCall's culdoplasty.  The remainder of the vaginal cuff was closed with a running stitch of 0-vicryl.     The vaginal cuff was closed vaginally using 0-Vicryl in a running locking stitch. This was tested for integrity using the surgeon's finger. After a change of gloves, the pneumoperitoneum was recreated and surgical site inspected, and found to be hemostatic. Bilateral ureters were visualized vermiuclating. No intraoperative injury to surrounding organs was noted. The abdomen was desufflated and all instruments were then removed.   All skin incisions were closed with 4-0 monocryl and covered with surgical glue. The Foley catheter was removed.  Vaginal estrogen cream was placed in the vagina.  The patient tolerated the procedures well.  All instruments, needles, and sponge counts were correct x 2. The patient was taken to the recovery room in stable condition.   ---- Larey Days, MD Attending Obstetrician and [facility]

## 2018-10-03 NOTE — Anesthesia Procedure Notes (Signed)
Procedure Name: Intubation Date/Time: 10/03/2018 9:58 AM Performed by: Eben Burow, CRNA Pre-anesthesia Checklist: Emergency Drugs available, Patient identified, Suction available and Patient being monitored Patient Re-evaluated:Patient Re-evaluated prior to induction Oxygen Delivery Method: Circle system utilized Preoxygenation: Pre-oxygenation with 100% oxygen Induction Type: IV induction Ventilation: Mask ventilation without difficulty Laryngoscope Size: Miller and 2 Grade View: Grade I Tube type: Oral Tube size: 7.0 mm Number of attempts: 1 Airway Equipment and Method: Stylet and LTA kit utilized Placement Confirmation: ETT inserted through vocal cords under direct vision,  positive ETCO2 and breath sounds checked- equal and bilateral Secured at: 20 cm Tube secured with: Tape Dental Injury: Teeth and Oropharynx as per pre-operative assessment

## 2018-10-03 NOTE — OR Nursing (Signed)
Patient reports having a hx of cocaine, percocet and alcohol buse.  Denies any use since age 48.  Dr Randa Lynn aware, no new orders at this time.

## 2018-10-03 NOTE — Discharge Instructions (Signed)
Discharge instructions:  Call office if you have any of the following: fever >101 F, chills, shortness of breath, excessive vaginal bleeding, incision drainage or problems, leg pain or redness, or any other concerns.   Activity: Do not lift > 15 lbs for 8 weeks.  No intercourse or tampons for 8 weeks.  No driving for 1-2 weeks.   You may feel some pain in your upper right abdomen/rib and right shoulder.  This is from the gas in the abdomen for surgery. This will subside over time, please be patient!  Take 800mg  Ibuprofen and 1000mg  Tylenol around the clock, every 6 hours for at least the first 3-5 days.  After this you can take as needed.  This will help decrease inflammation and promote healing.  The narcotics you'll take just as needed, as they just trick your brain into thinking its not in pain.    Apply the vaginal estrogen to the vagina twice daily - 1g each application until the tube runs out.    Please don't limit yourself in terms of routine activity.  You will be able to do most things, although they may take longer to do or be a little painful.  You can do it!  Don't be a hero, but don't be a wimp either!   AMBULATORY SURGERY  DISCHARGE INSTRUCTIONS   1) The drugs that you were given will stay in your system until tomorrow so for the next 24 hours you should not:  A) Drive an automobile B) Make any legal decisions C) Drink any alcoholic beverage   2) You may resume regular meals tomorrow.  Today it is better to start with liquids and gradually work up to solid foods.  You may eat anything you prefer, but it is better to start with liquids, then soup and crackers, and gradually work up to solid foods.   3) Please notify your doctor immediately if you have any unusual bleeding, trouble breathing, redness and pain at the surgery site, drainage, fever, or pain not relieved by medication.    4) Additional Instructions:        Please contact your physician with any  problems or Same Day Surgery at [phone], Monday through Friday 6 am to 4 pm, or [place] at [facility] number at [phone].

## 2018-10-03 NOTE — Addendum Note (Signed)
Addendum  created 10/03/18 1422 by Eben Burow, CRNA   Intraprocedure Flowsheets edited

## 2018-10-03 NOTE — Anesthesia Postprocedure Evaluation (Signed)
Anesthesia Post Note  Patient: Alejandra Frederick  Procedure(s) Performed: HYSTERECTOMY TOTAL LAPAROSCOPIC, bilateral Salpingectomy (N/A ) Anterior Colporrhaphy and Culdoplasty (N/A )  Patient location during evaluation: PACU Anesthesia Type: General Level of consciousness: awake and alert and oriented Pain management: pain level controlled Vital Signs Assessment: post-procedure vital signs reviewed and stable Respiratory status: spontaneous breathing, nonlabored ventilation and respiratory function stable Cardiovascular status: blood pressure returned to baseline and stable Postop Assessment: no signs of nausea or vomiting Anesthetic complications: no     Last Vitals:  Vitals:   10/03/18 1405 10/03/18 1410  BP:  (!) 106/59  Pulse: 99 83  Resp: 17 10  Temp:    SpO2: 99% 100%    Last Pain:  Vitals:   10/03/18 1410  TempSrc:   PainSc: 3                  Numan Zylstra

## 2018-10-03 NOTE — ED Triage Notes (Signed)
This RN spoke with Agricultural consultant of 3rd floor, pt was previously direct admit to 3rd floor by Dr. Leafy Ro, pt cancelled earlier today. This RN called 3rd floor upon patient arrival to ED, 3rd floor charge RN who paged Dr. Leafy Ro, per Tonna Boehringer, RN and Dr. Leafy Ro pt to be re-direct admitted to 3rd floor. This RN notified Raquel Sarna, RN of patient's weakness, intermittent agitation and attempting to get out of wheelchair and repeatedly stating "my body is just trying to push it out". Pt taken to 3rd floor via wheelchair by Lattie Haw, EDT and taken to room 348 per Charyl Dancer.

## 2018-10-03 NOTE — Progress Notes (Signed)
Pt pain still 9/10. Dr. Randa Lynn notified. Acknowledged. Orders received.

## 2018-10-03 NOTE — Progress Notes (Signed)
Pt CBG in PACU: 252. Dr. Randa Lynn notified. Acknowledged. No new orders.

## 2018-10-03 NOTE — Progress Notes (Signed)
Pt called postop with urinary retention and pain, but after plans for admission with foley made, pt able to spontaneously empty bladder.

## 2018-10-03 NOTE — Anesthesia Post-op Follow-up Note (Signed)
Anesthesia QCDR form completed.        

## 2018-10-03 NOTE — Transfer of Care (Signed)
Immediate Anesthesia Transfer of Care Note  Patient: Alejandra Frederick  Procedure(s) Performed: HYSTERECTOMY TOTAL LAPAROSCOPIC, bilateral Salpingectomy (N/A ) Anterior Colporrhaphy and Culdoplasty (N/A )  Patient Location: PACU  Anesthesia Type:General  Level of Consciousness: drowsy  Airway & Oxygen Therapy: Patient Spontanous Breathing and Patient connected to face mask oxygen  Post-op Assessment: Report given to RN and Post -op Vital signs reviewed and stable  Post vital signs: Reviewed and stable  Last Vitals:  Vitals Value Taken Time  BP 117/63 10/03/18 1233  Temp 97.4   Pulse 93 10/03/18 1237  Resp 16 10/03/18 1237  SpO2 100 % 10/03/18 1237  Vitals shown include unvalidated device data.  Last Pain:  Vitals:   10/03/18 1233  TempSrc:   PainSc: (P) Asleep         Complications: No apparent anesthesia complications

## 2018-10-03 NOTE — H&P (Signed)
Consult History and Physical   SERVICE: Gynecology   Patient Name: Alejandra Frederick Patient MRN:   [id number]  CC: I can't empty my bladder   HPI: Alejandra Frederick is a 48 y.o. [id number] POD#0 from TLH/BS with anterior colporrhaphy with McCall's culdoplasty for abnormal uterine bleeding with anterior prolapse. She was discharged from the PACU without emptying her bladder and found herself unable to urinate at home. Foley was removed around noon. She is in significant pain and I've asked her to come in for monitoring.  However, pt spontaneously voided, and admission canceled.  Review of Systems: positives in bold GEN:   fevers, chills, weight changes, appetite changes, fatigue, night sweats HEENT:  HA, vision changes, hearing loss, congestion, rhinorrhea, sinus pressure, dysphagia CV:   CP, palpitations PULM:  SOB, cough GI:  abd pain, N/V/D/C GU:  dysuria, urgency, frequency MSK:  arthralgias, myalgias, back pain, swelling SKIN:  rashes, color changes, pallor NEURO:  numbness, weakness, tingling, seizures, dizziness, tremors PSYCH:  depression, anxiety, behavioral problems, confusion  HEME/LYMPH:  easy bruising or bleeding ENDO:  heat/cold intolerance  Past Obstetrical History: OB History    Gravida  3   Para  3   Term      Preterm  3   AB      Living  3     SAB      TAB      Ectopic      Multiple      Live Births              Past Gynecologic History: Patient's last menstrual period was 09/11/2018 (exact date).  Past Medical History: Past Medical History:  Diagnosis Date  . Anxiety   . Asthma   . Cervical cancer [facility]) 2007   surgical resection to freeze cells.   . Complication of anesthesia    hard to wake up   . Depression   . Diabetes mellitus without complication ([place])   . Gastroparesis   . Headache   . High cholesterol   . Hypertension   . Myocardial infarction ([place]) 2016  . Pulmonary embolus ([place])   . Stroke [facility]) 2013    Past Surgical  History:   Past Surgical History:  Procedure Laterality Date  . TONSILLECTOMY    . TUBAL LIGATION      Family History:  family history is not on file.  Social History:  Social History   Socioeconomic History  . Marital status: Married    Spouse name: Not on file  . Number of children: Not on file  . Years of education: Not on file  . Highest education level: Not on file  Occupational History  . Not on file  Social Needs  . Financial resource strain: Not on file  . Food insecurity    Worry: Not on file    Inability: Not on file  . Transportation needs    Medical: Not on file    Non-medical: Not on file  Tobacco Use  . Smoking status: Current Every Day Smoker    Packs/day: 0.25    Types: Cigarettes  . Smokeless tobacco: Never Used  Substance and Sexual Activity  . Alcohol use: No  . Drug use: Yes    Types: Cocaine    Comment: percocet and hx of alcohol abuse. denies any since age 26  . Sexual activity: Not on file  Lifestyle  . Physical activity    Days per week: Not on file    Minutes per  session: Not on file  . Stress: Not on file  Relationships  . Social Herbalist on phone: Not on file    Gets together: Not on file    Attends religious service: Not on file    Active member of club or organization: Not on file    Attends meetings of clubs or organizations: Not on file    Relationship status: Not on file  . Intimate partner violence    Fear of current or ex partner: Not on file    Emotionally abused: Not on file    Physically abused: Not on file    Forced sexual activity: Not on file  Other Topics Concern  . Not on file  Social History Narrative  . Not on file    Home Medications:  Medications reconciled in EPIC  No current facility-administered medications on file prior to encounter.    Current Outpatient Medications on File Prior to Encounter  Medication Sig Dispense Refill  . albuterol (VENTOLIN HFA) 108 (90 Base) MCG/ACT inhaler  Inhale 1-2 puffs into the lungs every 6 (six) hours as needed for wheezing or shortness of breath.    Marland Kitchen aspirin EC 81 MG tablet Take 81 mg by mouth at bedtime.    . clonazePAM (KLONOPIN) 2 MG tablet Take 1-2 mg by mouth See admin instructions. Take 1 tablet (2 mg) by mouth in the morning, 1 tablet (2 mg) by mouth in the afternoon, 0.5 tablet (1 mg) by mouth at bedtime.    . cloNIDine (CATAPRES) 0.2 MG tablet Take 0.2 mg by mouth at bedtime.    . cyclobenzaprine (FLEXERIL) 5 MG tablet Take 5 mg by mouth at bedtime.    . diphenhydrAMINE-Zinc Acetate (ANTI-ITCH EXTRA STRENGTH EX) Apply 1 application topically 4 (four) times daily as needed (itching.).    Marland Kitchen fenofibrate (TRICOR) 48 MG tablet Take by mouth.    Marland Kitchen ibuprofen (ADVIL) 200 MG tablet Take 600-800 mg by mouth 2 (two) times daily as needed (pain.).    Marland Kitchen ibuprofen (ADVIL) 800 MG tablet Take 1 tablet (800 mg total) by mouth every 6 (six) hours. 45 tablet 1  . insulin aspart (NOVOLOG) 100 UNIT/ML injection Inject 2-10 Units into the skin 3 (three) times daily. Sliding Scale insulin    . Insulin Glargine (BASAGLAR KWIKPEN [place]) Inject 40 Units into the skin.    . Insulin Pen Needle (FIFTY50 PEN NEEDLES) 32G X 4 MM MISC For use with insulin pen    . ketorolac (TORADOL) 30 MG/ML injection Inject 30 mg into the muscle daily as needed (migraine headaches.).    Marland Kitchen methylphenidate (RITALIN) 10 MG tablet Take 30 mg by mouth 2 (two) times daily.    . metoprolol succinate (TOPROL-XL) 25 MG 24 hr tablet Take 25 mg by mouth every evening.     Marland Kitchen morphine (MSIR) 15 MG tablet Take 1 tablet (15 mg total) by mouth every 4 (four) hours as needed for severe pain. 21 tablet 0  . naphazoline-pheniramine (VISINE-A) 0.025-0.3 % ophthalmic solution Place 1 drop into both eyes 4 (four) times daily as needed for eye irritation.    . nortriptyline (PAMELOR) 10 MG capsule Take 40 mg by mouth at bedtime.     . phenazopyridine (PYRIDIUM) 200 MG tablet Take 1 tablet (200 mg total)  by mouth 3 (three) times daily with meals. 10 tablet 0  . prazosin (MINIPRESS) 5 MG capsule Take 10 mg by mouth at bedtime.    . promethazine (PHENERGAN) 12.5  MG tablet Take 1 tablet (12.5 mg total) by mouth every 6 (six) hours as needed for nausea or vomiting. 30 tablet 0    Allergies:  Allergies  Allergen Reactions  . Bee Venom Anaphylaxis  . Ciprofloxacin Anaphylaxis  . Zofran [Ondansetron Hcl] Anaphylaxis    Swelling of the throat and redness of the face   . Lactose Intolerance (Gi) Diarrhea    Upset stomach    Physical Exam:  Temp:  [97 F (36.1 C)-98 F (36.7 C)] 97 F (36.1 C) (08/24 1440) Pulse Rate:  [83-105] 101 (08/24 1440) Resp:  [10-22] 18 (08/24 1440) BP: (101-123)/(52-81) 116/65 (08/24 1440) SpO2:  [95 %-100 %] 100 % (08/24 1440)     Labs/Studies:   CBC and Coags:  Lab Results  Component Value Date   WBC 10.9 (H) 09/29/2018   NEUTOPHILPCT 66 11/04/2017   EOSPCT 1 11/04/2017   BASOPCT 1 11/04/2017   LYMPHOPCT 27 11/04/2017   HGB 13.7 09/29/2018   HCT 41.3 09/29/2018   MCV 89.0 09/29/2018   PLT 271 09/29/2018   CMP:  Lab Results  Component Value Date   NA 135 09/29/2018   K 4.1 09/29/2018   CL 105 09/29/2018   CO2 23 09/29/2018   BUN 9 09/29/2018   CREATININE 0.78 09/29/2018   CREATININE 1.02 (H) 11/05/2017   CREATININE 0.92 11/04/2017   PROT 6.9 11/04/2017   BILITOT 0.5 11/04/2017   ALT 19 11/04/2017   AST 21 11/04/2017   ALKPHOS 78 11/04/2017    Other Imaging: No results found.   Assessment / Plan:   JASZMIN DIETERT is a 48 y.o. [id number] who presents after surgery 7 hours ago with TLH/Bs and anterior repair, with urinary retention and acute pain  1. Place foley cath 2. Overnight pain medication 3. Plan for voiding trial with replacement of foley in the am 4. Standard postop care 5. Postop labs in the morning  **canceled visit

## 2018-10-03 NOTE — H&P (Signed)
Preoperative History and Physical  Alejandra Frederick is a 48 y.o. [phone] here for surgical management of pelvic pain and abnormal bleeding, and cystocele.   No significant preoperative concerns.  Proposed surgery: TLH BS, anterior colporrhaphy  Past Medical History:  Diagnosis Date  . Anxiety   . Asthma   . Cervical cancer [facility]) 2007   surgical resection to freeze cells.   . Complication of anesthesia    hard to wake up   . Depression   . Diabetes mellitus without complication ([place])   . Gastroparesis   . Headache   . High cholesterol   . Hypertension   . Myocardial infarction ([place]) 2016  . Pulmonary embolus ([place])   . Stroke [facility]) 2013   Past Surgical History:  Procedure Laterality Date  . TONSILLECTOMY    . TUBAL LIGATION     OB History  Gravida Para Term Preterm AB Living  3 3   3   3   SAB TAB Ectopic Multiple Live Births               # Outcome Date GA Lbr Len/2nd Weight Sex Delivery Anes PTL Lv  3 Preterm           2 Preterm           1 Preterm           Patient denies any other pertinent gynecologic issues.   No current facility-administered medications on file prior to encounter.    Current Outpatient Medications on File Prior to Encounter  Medication Sig Dispense Refill  . albuterol (VENTOLIN HFA) 108 (90 Base) MCG/ACT inhaler Inhale 1-2 puffs into the lungs every 6 (six) hours as needed for wheezing or shortness of breath.    Marland Kitchen aspirin EC 81 MG tablet Take 81 mg by mouth at bedtime.    . clonazePAM (KLONOPIN) 2 MG tablet Take 1-2 mg by mouth See admin instructions. Take 1 tablet (2 mg) by mouth in the morning, 1 tablet (2 mg) by mouth in the afternoon, 0.5 tablet (1 mg) by mouth at bedtime.    . cloNIDine (CATAPRES) 0.2 MG tablet Take 0.2 mg by mouth at bedtime.    . cyclobenzaprine (FLEXERIL) 5 MG tablet Take 5 mg by mouth at bedtime.    Marland Kitchen ibuprofen (ADVIL) 200 MG tablet Take 600-800 mg by mouth 2 (two) times daily as needed (pain.).    Marland Kitchen insulin aspart  (NOVOLOG) 100 UNIT/ML injection Inject 2-10 Units into the skin 3 (three) times daily. Sliding Scale insulin    . ketorolac (TORADOL) 30 MG/ML injection Inject 30 mg into the muscle daily as needed (migraine headaches.).    Marland Kitchen methylphenidate (RITALIN) 10 MG tablet Take 30 mg by mouth 2 (two) times daily.    . metoprolol succinate (TOPROL-XL) 25 MG 24 hr tablet Take 25 mg by mouth every evening.     . naphazoline-pheniramine (VISINE-A) 0.025-0.3 % ophthalmic solution Place 1 drop into both eyes 4 (four) times daily as needed for eye irritation.    . nortriptyline (PAMELOR) 10 MG capsule Take 40 mg by mouth at bedtime.     . prazosin (MINIPRESS) 5 MG capsule Take 10 mg by mouth at bedtime.    . promethazine (PHENERGAN) 12.5 MG tablet Take 1 tablet (12.5 mg total) by mouth every 6 (six) hours as needed for nausea or vomiting. 30 tablet 0  . diphenhydrAMINE-Zinc Acetate (ANTI-ITCH EXTRA STRENGTH EX) Apply 1 application topically 4 (four) times daily as needed (itching.).    Marland Kitchen  fenofibrate (TRICOR) 48 MG tablet Take by mouth.    . Insulin Pen Needle (FIFTY50 PEN NEEDLES) 32G X 4 MM MISC For use with insulin pen     Allergies  Allergen Reactions  . Bee Venom Anaphylaxis  . Ciprofloxacin Anaphylaxis  . Zofran [Ondansetron Hcl] Anaphylaxis    Swelling of the throat and redness of the face   . Lactose Intolerance (Gi) Diarrhea    Upset stomach    Social History:   reports that she has been smoking cigarettes. She has been smoking about 0.25 packs per day. She has never used smokeless tobacco. She reports current drug use. Drug: Cocaine. She reports that she does not drink alcohol.  History reviewed. No pertinent family history.  Review of Systems: Noncontributory  PHYSICAL EXAM: Blood pressure 110/81, pulse (!) 104, temperature 98 F (36.7 C), temperature source Tympanic, resp. rate 18, last menstrual period 09/11/2018, SpO2 99 %. General appearance - alert, well appearing, and in no  distress Chest - clear to auscultation, no wheezes, rales or rhonchi, symmetric air entry Heart - normal rate and regular rhythm Abdomen - soft, nontender, nondistended, no masses or organomegaly Pelvic - examination not indicated Extremities - peripheral pulses normal, no pedal edema, no clubbing or cyanosis  Labs: Results for orders placed or performed during the hospital encounter of 10/03/18 (from the past 336 hour(s))  Glucose, capillary   Collection Time: 10/03/18  8:18 AM  Result Value Ref Range   Glucose-Capillary 218 (H) 70 - 99 mg/dL  ABO/Rh   Collection Time: 10/03/18  8:18 AM  Result Value Ref Range   ABO/RH(D) PENDING   Pregnancy, urine POC   Collection Time: 10/03/18  9:16 AM  Result Value Ref Range   Preg Test, Ur NEGATIVE NEGATIVE  Results for orders placed or performed during the hospital encounter of 09/29/18 (from the past 336 hour(s))  CBC   Collection Time: 09/29/18  9:40 AM  Result Value Ref Range   WBC 10.9 (H) 4.0 - 10.5 K/uL   RBC 4.64 3.87 - 5.11 MIL/uL   Hemoglobin 13.7 12.0 - 15.0 g/dL   HCT 41.3 36.0 - 46.0 %   MCV 89.0 80.0 - 100.0 fL   MCH 29.5 26.0 - 34.0 pg   MCHC 33.2 30.0 - 36.0 g/dL   RDW 12.7 11.5 - 15.5 %   Platelets 271 150 - 400 K/uL   nRBC 0.0 0.0 - 0.2 %  Basic metabolic panel   Collection Time: 09/29/18  9:40 AM  Result Value Ref Range   Sodium 135 135 - 145 mmol/L   Potassium 4.1 3.5 - 5.1 mmol/L   Chloride 105 98 - 111 mmol/L   CO2 23 22 - 32 mmol/L   Glucose, Bld 204 (H) 70 - 99 mg/dL   BUN 9 6 - 20 mg/dL   Creatinine, Ser 0.78 0.44 - 1.00 mg/dL   Calcium 9.2 8.9 - 10.3 mg/dL   GFR calc non Af Amer >60 >60 mL/min   GFR calc Af Amer >60 >60 mL/min   Anion gap 7 5 - 15  Type and screen [place]   Collection Time: 09/29/18  9:40 AM  Result Value Ref Range   ABO/RH(D) O POS    Antibody Screen NEG    Sample Expiration 10/13/2018,2359    Extend sample reason      NO TRANSFUSIONS OR PREGNANCY IN THE  PAST 3 MONTHS Performed at [facility], [address], [place], [place] [address]  SARS CORONAVIRUS 2 Nasal Swab Aptima Multi Swab   Collection Time: 09/29/18 10:07 AM   Specimen: Aptima Multi Swab; Nasal Swab  Result Value Ref Range   SARS Coronavirus 2 NEGATIVE NEGATIVE    Imaging Studies: No results found.  Assessment: Patient Active Problem List   Diagnosis Date Noted  . Bipolar I disorder, most recent episode mixed ([place]) 08/24/2016  . Acute respiratory failure with hypoxia ([place])   . Accidental drug overdose   . Encephalopathy acute 08/22/2016    Plan: Patient will undergo surgical management with TLH BS, anterior colporrhaphy.   The risks of surgery were discussed in detail with the patient including but not limited to: bleeding which may require transfusion or reoperation; infection which may require antibiotics; injury to surrounding organs which may involve bowel, bladder, ureters ; need for additional procedures including laparoscopy or laparotomy; thromboembolic phenomenon, surgical site problems and other postoperative/anesthesia complications. Likelihood of success in alleviating the patient's condition was discussed. Routine postoperative instructions will be reviewed with the patient and her family in detail after surgery.  The patient concurred with the proposed plan, giving informed written consent for the surgery.  Patient has been NPO since last night she will remain NPO for procedure.  Anesthesia and OR aware.  Preoperative prophylactic antibiotics and SCDs ordered on call to the OR.  To OR when ready.   ----- Larey Days, MD, [place] Attending Obstetrician and Gynecologist [facility], Department of [facility]

## 2018-10-03 NOTE — H&P (Signed)
Consult History and Physical   SERVICE: Gynecology   Patient Name: Alejandra Frederick Patient MRN:   [id number]  CC: increased pain, weakness, rising blood sugar and difficulty emptying bladder  HPI: Alejandra Frederick is a 48 y.o. [id number] POD#0 from TLH/BS with anterior colporrhaphy with McCall's culdoplasty for abnormal uterine bleeding with anterior prolapse. She was discharged from the PACU without emptying her bladder and found herself unable to urinate at home. Foley was removed around noon. She is in significant pain and was unable to void at home, Dr Leafy Ro recommended admission around 1730 but then pt was able to void spontaneously and admission was cancelled.  Pts husband called back around 2100, with pt c/o worsening pain and difficulty voiding, has taken 2 tabs of PO Morphine 15mg , and 2 tabs of Ibuprofen 800mg  since 1630. Additionally notes that her blood sugar was rising and persistently above 300, has taken 2 doses of her husbands insulin this evening. Pt has not taken her normal home meds this evening.   Consulted Dr  Leafy Ro, pt to be admitted for pain management and foley catheter overnight.     Review of Systems: positives in bold GEN:   fevers, chills, weight changes, appetite changes, fatigue, night sweats HEENT:  HA, vision changes, hearing loss, congestion, rhinorrhea, sinus pressure, dysphagia CV:   CP, palpitations PULM:  SOB, cough GI:  abd pain, N/V/D/C GU:  dysuria, urgency, frequency, has voided only a dribble since surgery.  MSK:  arthralgias, myalgias, back pain, swelling SKIN:  rashes, color changes, pallor NEURO:  numbness, weakness, tingling, seizures, dizziness, tremors PSYCH:  depression, anxiety, behavioral problems, confusion  HEME/LYMPH:  easy bruising or bleeding ENDO:  heat/cold intolerance  Past Obstetrical History: OB History    Gravida  3   Para  3   Term      Preterm  3   AB      Living  3     SAB      TAB      Ectopic       Multiple      Live Births              Past Gynecologic History: Patient's last menstrual period was 09/11/2018 (exact date).   Past Medical History: Past Medical History:  Diagnosis Date  . Anxiety   . Asthma   . Cervical cancer [facility]) 2007   surgical resection to freeze cells.   . Complication of anesthesia    hard to wake up   . Depression   . Diabetes mellitus without complication ([place])   . Gastroparesis   . Headache   . High cholesterol   . Hypertension   . Myocardial infarction ([place]) 2016  . Pulmonary embolus ([place])   . Stroke [facility]) 2013    Past Surgical History:   Past Surgical History:  Procedure Laterality Date  . TONSILLECTOMY    . TUBAL LIGATION      Family History:  family history is not on file.  Social History:  Social History   Socioeconomic History  . Marital status: Married    Spouse name: Not on file  . Number of children: Not on file  . Years of education: Not on file  . Highest education level: Not on file  Occupational History  . Not on file  Social Needs  . Financial resource strain: Not on file  . Food insecurity    Worry: Not on file    Inability: Not on file  . Transportation  needs    Medical: Not on file    Non-medical: Not on file  Tobacco Use  . Smoking status: Current Every Day Smoker    Packs/day: 0.25    Types: Cigarettes  . Smokeless tobacco: Never Used  Substance and Sexual Activity  . Alcohol use: No  . Drug use: Yes    Types: Cocaine    Comment: percocet and hx of alcohol abuse. denies any since age 88  . Sexual activity: Not on file  Lifestyle  . Physical activity    Days per week: Not on file    Minutes per session: Not on file  . Stress: Not on file  Relationships  . Social Herbalist on phone: Not on file    Gets together: Not on file    Attends religious service: Not on file    Active member of club or organization: Not on file    Attends meetings of clubs or organizations: Not on file     Relationship status: Not on file  . Intimate partner violence    Fear of current or ex partner: Not on file    Emotionally abused: Not on file    Physically abused: Not on file    Forced sexual activity: Not on file  Other Topics Concern  . Not on file  Social History Narrative  . Not on file    Home Medications:  Medications reconciled in EPIC  No current facility-administered medications on file prior to encounter.    Current Outpatient Medications on File Prior to Encounter  Medication Sig Dispense Refill  . albuterol (VENTOLIN HFA) 108 (90 Base) MCG/ACT inhaler Inhale 1-2 puffs into the lungs every 6 (six) hours as needed for wheezing or shortness of breath.    Marland Kitchen aspirin EC 81 MG tablet Take 81 mg by mouth at bedtime.    . clonazePAM (KLONOPIN) 2 MG tablet Take 1-2 mg by mouth See admin instructions. Take 1 tablet (2 mg) by mouth in the morning, 1 tablet (2 mg) by mouth in the afternoon, 0.5 tablet (1 mg) by mouth at bedtime.    . cloNIDine (CATAPRES) 0.2 MG tablet Take 0.2 mg by mouth at bedtime.    . cyclobenzaprine (FLEXERIL) 5 MG tablet Take 5 mg by mouth at bedtime.    . diphenhydrAMINE-Zinc Acetate (ANTI-ITCH EXTRA STRENGTH EX) Apply 1 application topically 4 (four) times daily as needed (itching.).    Marland Kitchen fenofibrate (TRICOR) 48 MG tablet Take by mouth.    Marland Kitchen ibuprofen (ADVIL) 200 MG tablet Take 600-800 mg by mouth 2 (two) times daily as needed (pain.).    Marland Kitchen ibuprofen (ADVIL) 800 MG tablet Take 1 tablet (800 mg total) by mouth every 6 (six) hours. 45 tablet 1  . insulin aspart (NOVOLOG) 100 UNIT/ML injection Inject 2-10 Units into the skin 3 (three) times daily. Sliding Scale insulin    . Insulin Glargine (BASAGLAR KWIKPEN [place]) Inject 40 Units into the skin.    . Insulin Pen Needle (FIFTY50 PEN NEEDLES) 32G X 4 MM MISC For use with insulin pen    . ketorolac (TORADOL) 30 MG/ML injection Inject 30 mg into the muscle daily as needed (migraine headaches.).    Marland Kitchen methylphenidate  (RITALIN) 10 MG tablet Take 30 mg by mouth 2 (two) times daily.    . metoprolol succinate (TOPROL-XL) 25 MG 24 hr tablet Take 25 mg by mouth every evening.     Marland Kitchen morphine (MSIR) 15 MG tablet Take 1 tablet (  15 mg total) by mouth every 4 (four) hours as needed for severe pain. 21 tablet 0  . naphazoline-pheniramine (VISINE-A) 0.025-0.3 % ophthalmic solution Place 1 drop into both eyes 4 (four) times daily as needed for eye irritation.    . nortriptyline (PAMELOR) 10 MG capsule Take 40 mg by mouth at bedtime.     . phenazopyridine (PYRIDIUM) 200 MG tablet Take 1 tablet (200 mg total) by mouth 3 (three) times daily with meals. 10 tablet 0  . prazosin (MINIPRESS) 5 MG capsule Take 10 mg by mouth at bedtime.    . promethazine (PHENERGAN) 12.5 MG tablet Take 1 tablet (12.5 mg total) by mouth every 6 (six) hours as needed for nausea or vomiting. (Patient not taking: Reported on 10/03/2018) 30 tablet 0    Allergies:  Allergies  Allergen Reactions  . Bee Venom Anaphylaxis  . Ciprofloxacin Anaphylaxis  . Zofran [Ondansetron Hcl] Anaphylaxis    Swelling of the throat and redness of the face   . Lactose Intolerance (Gi) Diarrhea    Upset stomach    Physical Exam:  Temp:  [97 F (36.1 C)-100.8 F (38.2 C)] 100.8 F (38.2 C) (08/24 2226) Pulse Rate:  [83-126] 126 (08/24 2226) Resp:  [10-24] 24 (08/24 2226) BP: (101-155)/(52-81) 155/80 (08/24 2226) SpO2:  [95 %-100 %] 97 % (08/24 2226)   General Appearance:  Well developed, well nourished, no acute distress, alert and oriented x3 HEENT:  Normocephalic atraumatic, extraocular movements intact, moist mucous membranes Cardiovascular:  Normal S1/S2, regular rate and rhythm, no murmurs Pulmonary:  clear to auscultation, no wheezes, rales or rhonchi, symmetric air entry, good air exchange Abdomen:  Bowel sounds present, soft,  no abnormal masses, no epigastric pain. Laparoscopic incisions on either side of lower abdomen and periumbilical, all with  medical glue intact, no erythema or drainage. Bladder now non-distended, Foley cath placed by nursing with approx 1158ml drained of yellow clear urine.   Extremities:  Full range of motion, no pedal edema, 2+ distal pulses, no tenderness Skin:  normal coloration and turgor, no rashes, no suspicious skin lesions noted  Neurologic:  Cranial nerves 2-12 grossly intact, normal muscle tone, strength 5/5 all four extremities Psychiatric:  Appropriate responses Pelvic:  Deferred, scant serosanguinous drainage noted on peripad.    Labs/Studies:   CBC and Coags:  Lab Results  Component Value Date   WBC 10.9 (H) 09/29/2018   NEUTOPHILPCT 66 11/04/2017   EOSPCT 1 11/04/2017   BASOPCT 1 11/04/2017   LYMPHOPCT 27 11/04/2017   HGB 13.7 09/29/2018   HCT 41.3 09/29/2018   MCV 89.0 09/29/2018   PLT 271 09/29/2018   CMP:  Lab Results  Component Value Date   NA 135 09/29/2018   K 4.1 09/29/2018   CL 105 09/29/2018   CO2 23 09/29/2018   BUN 9 09/29/2018   CREATININE 0.78 09/29/2018   CREATININE 1.02 (H) 11/05/2017   CREATININE 0.92 11/04/2017   PROT 6.9 11/04/2017   BILITOT 0.5 11/04/2017   ALT 19 11/04/2017   AST 21 11/04/2017   ALKPHOS 78 11/04/2017     Assessment / Plan:   KITTI DOORNBOS is a 48 y.o. [id number] who presents after surgery 13 hours ago with TLH/Bs and anterior repair, with urinary retention and acute pain   1. Place foley cath and remain in place overnight.  2. Overnight pain medication including Toradol after creatinine level returned, scheduled tylenol, gabapentin and benadryl prn sleep. Morphine 15mg  IR for breakthrough severe pain per dr Leafy Ro orders.  3. Standard postop care 4. Glycemic control with SSI, q4hr CBG per Dr Leafy Ro.  5. Urine drug screen via foley, pt with hx drug use.   POC discussed with and determined with Dr Leafy Ro.   Francetta Found, CNM 10/03/2018  11:26 PM

## 2018-10-03 NOTE — Anesthesia Preprocedure Evaluation (Addendum)
Anesthesia Evaluation  Patient identified by MRN, date of birth, ID band Patient awake    Reviewed: Allergy & Precautions, NPO status , Patient's Chart, lab work & pertinent test results  History of Anesthesia Complications Negative for: history of anesthetic complications  Airway Mallampati: II  TM Distance: >3 FB Neck ROM: Full    Dental  (+) Edentulous Upper, Poor Dentition   Pulmonary asthma , neg sleep apnea, Current Smoker and Patient abstained from smoking.,    breath sounds clear to auscultation- rhonchi (-) wheezing      Cardiovascular hypertension, Pt. on medications + CAD and + Past MI  (-) Cardiac Stents and (-) CABG  Rhythm:Regular Rate:Normal - Systolic murmurs and - Diastolic murmurs    Neuro/Psych  Headaches, Seizures -, Well Controlled,  PSYCHIATRIC DISORDERS Anxiety Depression Bipolar Disorder CVA    GI/Hepatic negative GI ROS, Neg liver ROS,   Endo/Other  diabetes, Insulin Dependent  Renal/GU negative Renal ROS     Musculoskeletal negative musculoskeletal ROS (+)   Abdominal (+) - obese,   Peds  Hematology negative hematology ROS (+)   Anesthesia Other Findings Past Medical History: No date: Anxiety No date: Asthma 2007: Cervical cancer ([place])     Comment:  surgical resection to freeze cells.  No date: Complication of anesthesia     Comment:  hard to wake up  No date: Depression No date: Diabetes mellitus without complication ([facility]) No date: Gastroparesis No date: Headache No date: High cholesterol No date: Hypertension 2016: Myocardial infarction [facility]) No date: Pulmonary embolus ([place]) 2013: Stroke ([place])   Reproductive/Obstetrics                             Anesthesia Physical Anesthesia Plan  ASA: III  Anesthesia Plan: General   Post-op Pain Management:    Induction: Intravenous  PONV Risk Score and Plan: 1 and Midazolam and Scopolamine patch -  Pre-op  Airway Management Planned: Oral ETT  Additional Equipment:   Intra-op Plan:   Post-operative Plan: Extubation in OR  Informed Consent: I have reviewed the patients History and Physical, chart, labs and discussed the procedure including the risks, benefits and alternatives for the proposed anesthesia with the patient or authorized representative who has indicated his/her understanding and acceptance.     Dental advisory given  Plan Discussed with: CRNA and Anesthesiologist  Anesthesia Plan Comments:        Anesthesia Quick Evaluation

## 2018-10-04 ENCOUNTER — Encounter: Payer: Self-pay | Admitting: Obstetrics & Gynecology

## 2018-10-04 DIAGNOSIS — R102 Pelvic and perineal pain: Secondary | ICD-10-CM | POA: Diagnosis not present

## 2018-10-04 DIAGNOSIS — N938 Other specified abnormal uterine and vaginal bleeding: Secondary | ICD-10-CM | POA: Diagnosis not present

## 2018-10-04 LAB — GLUCOSE, CAPILLARY
Glucose-Capillary: 261 mg/dL — ABNORMAL HIGH (ref 70–99)
Glucose-Capillary: 191 mg/dL — ABNORMAL HIGH (ref 70–99)
Glucose-Capillary: 179 mg/dL — ABNORMAL HIGH (ref 70–99)

## 2018-10-04 LAB — URINE DRUG SCREEN, QUALITATIVE (ARMC ONLY)
Amphetamines, Ur Screen: NOT DETECTED
Barbiturates, Ur Screen: NOT DETECTED
Benzodiazepine, Ur Scrn: POSITIVE — AB
Cannabinoid 50 Ng, Ur ~~LOC~~: NOT DETECTED
Cocaine Metabolite,Ur ~~LOC~~: NOT DETECTED
MDMA (Ecstasy)Ur Screen: NOT DETECTED
Methadone Scn, Ur: NOT DETECTED
Opiate, Ur Screen: POSITIVE — AB
Phencyclidine (PCP) Ur S: NOT DETECTED
Tricyclic, Ur Screen: POSITIVE — AB

## 2018-10-04 LAB — SURGICAL PATHOLOGY

## 2018-10-04 LAB — HEMOGLOBIN A1C
Hgb A1c MFr Bld: 7.6 % — ABNORMAL HIGH (ref 4.8–5.6)
Mean Plasma Glucose: 171.42 mg/dL

## 2018-10-04 MED ORDER — CLONIDINE HCL 0.1 MG PO TABS
0.2000 mg | ORAL_TABLET | Freq: Every day | ORAL | Status: DC
  Administered 2018-10-04: 02:00:00 0.2 mg via ORAL
  Filled 2018-10-04 (×2): qty 2

## 2018-10-04 MED ORDER — MORPHINE SULFATE 15 MG PO TABS
15.0000 mg | ORAL_TABLET | ORAL | Status: DC | PRN
  Filled 2018-10-04: qty 1

## 2018-10-04 MED ORDER — ASPIRIN 81 MG PO CHEW
81.0000 mg | CHEWABLE_TABLET | Freq: Every day | ORAL | Status: DC
  Administered 2018-10-04: 02:00:00 81 mg via ORAL
  Filled 2018-10-04 (×2): qty 1

## 2018-10-04 MED ORDER — ALBUTEROL SULFATE (2.5 MG/3ML) 0.083% IN NEBU: 2.5000 mg | INHALATION_SOLUTION | Freq: Four times a day (QID) | RESPIRATORY_TRACT | Status: DC | PRN

## 2018-10-04 MED ORDER — NORTRIPTYLINE HCL 10 MG PO CAPS
40.0000 mg | ORAL_CAPSULE | Freq: Every day | ORAL | Status: DC
  Administered 2018-10-04: 02:00:00 40 mg via ORAL
  Filled 2018-10-04 (×2): qty 4

## 2018-10-04 MED ORDER — IBUPROFEN 600 MG PO TABS
600.0000 mg | ORAL_TABLET | Freq: Four times a day (QID) | ORAL | Status: DC | PRN
  Administered 2018-10-04: 10:00:00 600 mg via ORAL
  Filled 2018-10-04: qty 1

## 2018-10-04 MED ORDER — PRAZOSIN HCL 5 MG PO CAPS
10.0000 mg | ORAL_CAPSULE | Freq: Every day | ORAL | Status: DC
  Administered 2018-10-04: 02:00:00 10 mg via ORAL
  Filled 2018-10-04 (×2): qty 2

## 2018-10-04 MED ORDER — METOPROLOL SUCCINATE ER 25 MG PO TB24
25.0000 mg | ORAL_TABLET | Freq: Every evening | ORAL | Status: DC
  Filled 2018-10-04 (×2): qty 1

## 2018-10-04 MED ORDER — PROMETHAZINE HCL 25 MG PO TABS: 12.5000 mg | ORAL_TABLET | Freq: Four times a day (QID) | ORAL | Status: DC | PRN

## 2018-10-04 NOTE — Discharge Summary (Signed)
POD#1TLH, BS and anterior repair  Subjective: The patient is doing well with minimal pain needs. Foley cath in place.  No nausea or vomiting. Pain is adequately controlled.  Hospital course: admitted for bladder release with urinary retention and foley placement for 1165ml on admission. Pain control overnight. Plan for d/c home with Foley cath and leg bag for 3 days with return to office for voiding trial.  Objective: Vital signs in last 24 hours: Temp:  [97 F (36.1 C)-100.8 F (38.2 C)] 98.3 F (36.8 C) (08/25 0814) Pulse Rate:  [74-126] 74 (08/25 0814) Resp:  [10-24] 20 (08/25 0814) BP: (92-155)/(52-80) 92/57 (08/25 0814) SpO2:  [95 %-100 %] 99 % (08/25 0814)  Intake/Output  Intake/Output Summary (Last 24 hours) at 10/04/2018 0915 Last data filed at 10/04/2018 0450 Gross per 24 hour  Intake -  Output 2300 ml  Net -2300 ml    Physical Exam:  General: Alert and oriented. CV: RRR Lungs: Clear bilaterally. GI: Soft, Nondistended. Incisions: Clean and dry. Urine: Clear, Foley in place Extremities: Nontender, no erythema, no edema.  Lab Results: Recent Labs    10/03/18 2315  HGB 11.7*  HCT 34.6*  WBC 18.6*  PLT 262                 Results for orders placed or performed during the hospital encounter of 10/03/18 (from the past 24 hour(s))  Basic metabolic panel     Status: Abnormal   Collection Time: 10/03/18 11:15 PM  Result Value Ref Range   Sodium 134 (L) 135 - 145 mmol/L   Potassium 4.9 3.5 - 5.1 mmol/L   Chloride 107 98 - 111 mmol/L   CO2 18 (L) 22 - 32 mmol/L   Glucose, Bld 272 (H) 70 - 99 mg/dL   BUN 13 6 - 20 mg/dL   Creatinine, Ser 1.00 0.44 - 1.00 mg/dL   Calcium 8.7 (L) 8.9 - 10.3 mg/dL   GFR calc non Af Amer >60 >60 mL/min   GFR calc Af Amer >60 >60 mL/min   Anion gap 9 5 - 15  CBC with Differential/Platelet     Status: Abnormal   Collection Time: 10/03/18 11:15 PM  Result Value Ref Range   WBC 18.6 (H) 4.0 - 10.5 K/uL   RBC 3.93 3.87 - 5.11  MIL/uL   Hemoglobin 11.7 (L) 12.0 - 15.0 g/dL   HCT 34.6 (L) 36.0 - 46.0 %   MCV 88.0 80.0 - 100.0 fL   MCH 29.8 26.0 - 34.0 pg   MCHC 33.8 30.0 - 36.0 g/dL   RDW 12.6 11.5 - 15.5 %   Platelets 262 150 - 400 K/uL   nRBC 0.0 0.0 - 0.2 %   Neutrophils Relative % 92 %   Neutro Abs 16.9 (H) 1.7 - 7.7 K/uL   Lymphocytes Relative 6 %   Lymphs Abs 1.2 0.7 - 4.0 K/uL   Monocytes Relative 2 %   Monocytes Absolute 0.4 0.1 - 1.0 K/uL   Eosinophils Relative 0 %   Eosinophils Absolute 0.0 0.0 - 0.5 K/uL   Basophils Relative 0 %   Basophils Absolute 0.0 0.0 - 0.1 K/uL   Immature Granulocytes 0 %   Abs Immature Granulocytes 0.08 (H) 0.00 - 0.07 K/uL  Hemoglobin A1c     Status: Abnormal   Collection Time: 10/03/18 11:15 PM  Result Value Ref Range   Hgb A1c MFr Bld 7.6 (H) 4.8 - 5.6 %   Mean Plasma Glucose 171.42 mg/dL  Glucose,  capillary     Status: Abnormal   Collection Time: 10/04/18  4:40 AM  Result Value Ref Range   Glucose-Capillary 191 (H) 70 - 99 mg/dL  Urine Drug Screen, Qualitative ([facility] only)     Status: Abnormal   Collection Time: 10/04/18  4:48 AM  Result Value Ref Range   Tricyclic, Ur Screen POSITIVE (A) NONE DETECTED   Amphetamines, Ur Screen NONE DETECTED NONE DETECTED   MDMA (Ecstasy)Ur Screen NONE DETECTED NONE DETECTED   Cocaine Metabolite,Ur [place] NONE DETECTED NONE DETECTED   Opiate, Ur Screen POSITIVE (A) NONE DETECTED   Phencyclidine (PCP) Ur S NONE DETECTED NONE DETECTED   Cannabinoid 50 Ng, Ur [place] NONE DETECTED NONE DETECTED   Barbiturates, Ur Screen NONE DETECTED NONE DETECTED   Benzodiazepine, Ur Scrn POSITIVE (A) NONE DETECTED   Methadone Scn, Ur NONE DETECTED NONE DETECTED  Glucose, capillary     Status: Abnormal   Collection Time: 10/04/18  8:43 AM  Result Value Ref Range   Glucose-Capillary 179 (H) 70 - 99 mg/dL   Comment 1 Notify RN     Assessment/Plan:  1. D/C home with ERAS meds as scheduled 2. Foley cath and leg bag teaching today, with plan for  retrograde voiding trial in office in 3 days. 3. Postop precautions given  Benjaman Kindler, MD   LOS: 1 day   Benjaman Kindler 10/04/2018, 9:15 AM

## 2018-10-04 NOTE — Progress Notes (Signed)
Inpatient Diabetes Program Recommendations  AACE/ADA: New Consensus Statement on Inpatient Glycemic Control   Target Ranges:  Prepandial:   less than 140 mg/dL      Peak postprandial:   less than 180 mg/dL (1-2 hours)      Critically ill patients:  140 - 180 mg/dL   Results for Alejandra Frederick, Alejandra Frederick (MRN [id number]) as of 10/04/2018 08:50  Ref. Range 10/03/2018 08:18 10/03/2018 12:37 10/04/2018 04:40 10/04/2018 08:43  Glucose-Capillary Latest Ref Range: 70 - 99 mg/dL 218 (H) 252 (H) 191 (H) 179 (H)   Review of Glycemic Control  Outpatient Diabetes medications: Basaglar 40 units QHS, Novolog 2-10 units TID with meals (meal coverage plus correction) Current orders for Inpatient glycemic control: Novolog 0-15 units Q4H  Inpatient Diabetes Program Recommendations:   Insulin-Basal: Please consider ordering Lantus 10 units Q24H.  NOTE: In reviewing chart, noted patient is followed by Dr. Honor Junes and was last seen on 08/23/18 for DM.  Last A1C was 8.4% on 07/27/18 in Care Everywhere.  Thanks, Barnie Alderman, RN, MSN, CDE Diabetes Coordinator Inpatient Diabetes Program [phone] (Team Pager from 8am to 5pm)

## 2018-10-04 NOTE — Progress Notes (Signed)
PACU care completed for pt and pt taken to Post-OP. On arrival to [place], pt was taken to the bathroom and pt voided independently. See I&O flowsheet on 10/03/2018 at 1500.

## 2018-10-04 NOTE — Progress Notes (Signed)
Patient discharged home. Discharge instructions, prescriptions and follow up appointment given to and reviewed with patient. Patient verbalized understanding.  Pt and spouse educated on going home with a foley and how to clean the area properly as well as how and when to change to the leg bag. Pt and spouse verbalized understanding.   Pt wheeled out by [employer]

## 2018-10-09 ENCOUNTER — Emergency Department: Payer: Medicare Other

## 2018-10-09 ENCOUNTER — Emergency Department
Admission: EM | Admit: 2018-10-09 | Discharge: 2018-10-09 | Disposition: A | Discharge status: Home / Self Care | Payer: Medicare Other | Attending: Emergency Medicine | Admitting: Emergency Medicine

## 2018-10-09 ENCOUNTER — Other Ambulatory Visit: Payer: Self-pay

## 2018-10-09 DIAGNOSIS — Z9889 Other specified postprocedural states: Secondary | ICD-10-CM | POA: Insufficient documentation

## 2018-10-09 DIAGNOSIS — N939 Abnormal uterine and vaginal bleeding, unspecified: Secondary | ICD-10-CM | POA: Diagnosis not present

## 2018-10-09 DIAGNOSIS — J45909 Unspecified asthma, uncomplicated: Secondary | ICD-10-CM | POA: Diagnosis not present

## 2018-10-09 DIAGNOSIS — I252 Old myocardial infarction: Secondary | ICD-10-CM | POA: Diagnosis not present

## 2018-10-09 DIAGNOSIS — I10 Essential (primary) hypertension: Secondary | ICD-10-CM | POA: Diagnosis not present

## 2018-10-09 DIAGNOSIS — K5901 Slow transit constipation: Secondary | ICD-10-CM

## 2018-10-09 DIAGNOSIS — F1410 Cocaine abuse, uncomplicated: Secondary | ICD-10-CM | POA: Insufficient documentation

## 2018-10-09 DIAGNOSIS — Z466 Encounter for fitting and adjustment of urinary device: Secondary | ICD-10-CM | POA: Diagnosis not present

## 2018-10-09 DIAGNOSIS — Z8541 Personal history of malignant neoplasm of cervix uteri: Secondary | ICD-10-CM | POA: Diagnosis not present

## 2018-10-09 DIAGNOSIS — Z794 Long term (current) use of insulin: Secondary | ICD-10-CM | POA: Insufficient documentation

## 2018-10-09 DIAGNOSIS — F17210 Nicotine dependence, cigarettes, uncomplicated: Secondary | ICD-10-CM | POA: Diagnosis not present

## 2018-10-09 DIAGNOSIS — Z79899 Other long term (current) drug therapy: Secondary | ICD-10-CM | POA: Insufficient documentation

## 2018-10-09 DIAGNOSIS — Z7982 Long term (current) use of aspirin: Secondary | ICD-10-CM | POA: Diagnosis not present

## 2018-10-09 DIAGNOSIS — E119 Type 2 diabetes mellitus without complications: Secondary | ICD-10-CM | POA: Insufficient documentation

## 2018-10-09 LAB — URINALYSIS, COMPLETE (UACMP) WITH MICROSCOPIC
Bacteria, UA: NONE SEEN
Bilirubin Urine: NEGATIVE
Glucose, UA: NEGATIVE mg/dL
Hgb urine dipstick: NEGATIVE
Ketones, ur: NEGATIVE mg/dL
Leukocytes,Ua: NEGATIVE
Nitrite: NEGATIVE
Protein, ur: NEGATIVE mg/dL
Specific Gravity, Urine: 1.004 — ABNORMAL LOW (ref 1.005–1.030)
WBC, UA: NONE SEEN WBC/hpf (ref 0–5)
pH: 7 (ref 5.0–8.0)

## 2018-10-09 LAB — CBC WITH DIFFERENTIAL/PLATELET
Abs Immature Granulocytes: 0.05 10*3/uL (ref 0.00–0.07)
Basophils Absolute: 0.1 10*3/uL (ref 0.0–0.1)
Basophils Relative: 0 %
Eosinophils Absolute: 0.3 10*3/uL (ref 0.0–0.5)
Eosinophils Relative: 2 %
HCT: 36.5 % (ref 36.0–46.0)
Hemoglobin: 12.3 g/dL (ref 12.0–15.0)
Immature Granulocytes: 0 %
Lymphocytes Relative: 25 %
Lymphs Abs: 3.6 10*3/uL (ref 0.7–4.0)
MCH: 29.8 pg (ref 26.0–34.0)
MCHC: 33.7 g/dL (ref 30.0–36.0)
MCV: 88.4 fL (ref 80.0–100.0)
Monocytes Absolute: 0.6 10*3/uL (ref 0.1–1.0)
Monocytes Relative: 4 %
Neutro Abs: 10.1 10*3/uL — ABNORMAL HIGH (ref 1.7–7.7)
Neutrophils Relative %: 69 %
Platelets: 311 10*3/uL (ref 150–400)
RBC: 4.13 MIL/uL (ref 3.87–5.11)
RDW: 13.2 % (ref 11.5–15.5)
WBC: 14.7 10*3/uL — ABNORMAL HIGH (ref 4.0–10.5)
nRBC: 0 % (ref 0.0–0.2)

## 2018-10-09 LAB — COMPREHENSIVE METABOLIC PANEL
ALT: 64 U/L — ABNORMAL HIGH (ref 0–44)
AST: 49 U/L — ABNORMAL HIGH (ref 15–41)
Albumin: 4 g/dL (ref 3.5–5.0)
Alkaline Phosphatase: 90 U/L (ref 38–126)
Anion gap: 12 (ref 5–15)
BUN: 12 mg/dL (ref 6–20)
CO2: 24 mmol/L (ref 22–32)
Calcium: 9.7 mg/dL (ref 8.9–10.3)
Chloride: 99 mmol/L (ref 98–111)
Creatinine, Ser: 0.88 mg/dL (ref 0.44–1.00)
GFR calc Af Amer: >60 mL/min (ref >60)
GFR calc non Af Amer: >60 mL/min (ref >60)
Glucose, Bld: 183 mg/dL — ABNORMAL HIGH (ref 70–99)
Potassium: 3.9 mmol/L (ref 3.5–5.1)
Sodium: 135 mmol/L (ref 135–145)
Total Bilirubin: 0.2 mg/dL — ABNORMAL LOW (ref 0.3–1.2)
Total Protein: 7.7 g/dL (ref 6.5–8.1)

## 2018-10-09 LAB — SAMPLE TO BLOOD BANK

## 2018-10-09 LAB — LACTIC ACID, PLASMA: Lactic Acid, Venous: 2.9 mmol/L (ref 0.5–1.9)

## 2018-10-09 MED ORDER — SODIUM CHLORIDE 0.9 % IV BOLUS
500.0000 mL | Freq: Once | INTRAVENOUS | Status: AC
  Administered 2018-10-09: 500 mL via INTRAVENOUS

## 2018-10-09 MED ORDER — KETOROLAC TROMETHAMINE 30 MG/ML IJ SOLN
15.0000 mg | Freq: Once | INTRAMUSCULAR | Status: AC
  Administered 2018-10-09: 15 mg via INTRAVENOUS
  Filled 2018-10-09: qty 1

## 2018-10-09 MED ORDER — BISACODYL 10 MG RE SUPP
10.0000 mg | Freq: Once | RECTAL | Status: AC
  Administered 2018-10-09: 10 mg via RECTAL
  Filled 2018-10-09: qty 1

## 2018-10-09 MED ORDER — FLEET ENEMA 7-19 GM/118ML RE ENEM
1.0000 | ENEMA | Freq: Once | RECTAL | Status: AC
  Administered 2018-10-09: 1 via RECTAL

## 2018-10-09 MED ORDER — PROMETHAZINE HCL 25 MG/ML IJ SOLN
25.0000 mg | Freq: Once | INTRAMUSCULAR | Status: AC
  Administered 2018-10-09: 25 mg via INTRAMUSCULAR

## 2018-10-09 MED ORDER — IBUPROFEN 600 MG PO TABS
600.0000 mg | ORAL_TABLET | ORAL | Status: DC
  Filled 2018-10-09: qty 1

## 2018-10-09 NOTE — ED Notes (Signed)
Patient complaining about constipation, conferred with Dr. Jacqualine Code who would prefer OB to see patient before taking any action

## 2018-10-09 NOTE — ED Notes (Signed)
Pt taken to CT.

## 2018-10-09 NOTE — ED Notes (Signed)
Date and time results received: 10/09/18 1842   Test: lactic acid Critical Value: 2.9  Name of Provider Notified: Dr. Jacqualine Code

## 2018-10-09 NOTE — Consult Note (Signed)
Consult History and Physical   SERVICE: Gynecology   Patient Name: Alejandra Frederick Patient MRN:   [id number]  CC: constipation and vaginal bleeding after straining for BM.   HPI: Alejandra Frederick is a 48 y.o. [id number], post-op total lap vag hyst with anterior repair on 8/24 by Dr Leonides Schanz.   - c/o no BM since 3 days prior to surgery, now 8 days total. - has taken miralax and colace once daily and also tried prune juice x 1.  - felt like she had impacted stool and was trying to self disimpact and push out stool while on the toilet.  - she noted bright red vaginal bleeding after straining.  - Her foley catheter has remained in place since being placed on Monday 8/24 for urinary retention post-op.    Review of Systems: positives in bold GEN:   fevers, chills, weight changes, appetite changes, fatigue, night sweats HEENT:  HA, vision changes, hearing loss, congestion, rhinorrhea, sinus pressure, dysphagia CV:   CP, palpitations PULM:  SOB, cough GI:  abd pain, N/V/D/C GU:  dysuria, urgency, frequency MSK:  arthralgias, myalgias, back pain, swelling SKIN:  rashes, color changes, pallor NEURO:  numbness, weakness, tingling, seizures, dizziness, tremors PSYCH:  depression, anxiety, behavioral problems, confusion  HEME/LYMPH:  easy bruising or bleeding ENDO:  heat/cold intolerance  Past Obstetrical History: OB History    Gravida  3   Para  3   Term      Preterm  3   AB      Living  3     SAB      TAB      Ectopic      Multiple      Live Births              Past Gynecologic History: Patient's last menstrual period was 09/11/2018 (exact date).   Past Medical History: Past Medical History:  Diagnosis Date  . Anxiety   . Asthma   . Cervical cancer [facility]) 2007   surgical resection to freeze cells.   . Complication of anesthesia    hard to wake up   . Depression   . Diabetes mellitus without complication ([place])   . Gastroparesis   . Headache   . High cholesterol    . Hypertension   . Myocardial infarction ([place]) 2016  . Pulmonary embolus ([place])   . Stroke [facility]) 2013    Past Surgical History:   Past Surgical History:  Procedure Laterality Date  . CYSTOCELE REPAIR N/A 10/03/2018   Procedure: Anterior Colporrhaphy and Culdoplasty;  Surgeon: Ward, Honor Loh, MD;  Location: [facility] ORS;  Service: Gynecology;  Laterality: N/A;  . LAPAROSCOPIC HYSTERECTOMY N/A 10/03/2018   Procedure: HYSTERECTOMY TOTAL LAPAROSCOPIC, bilateral Salpingectomy;  Surgeon: Ward, Honor Loh, MD;  Location: [facility] ORS;  Service: Gynecology;  Laterality: N/A;  . TONSILLECTOMY    . TUBAL LIGATION      Family History:  No family hx Gyn cancers  Social History:  Social History   Socioeconomic History  . Marital status: Married    Spouse name: Not on file  . Number of children: Not on file  . Years of education: Not on file  . Highest education level: Not on file  Occupational History  . Not on file  Social Needs  . Financial resource strain: Not on file  . Food insecurity    Worry: Not on file    Inability: Not on file  . Transportation needs    Medical: Not  on file    Non-medical: Not on file  Tobacco Use  . Smoking status: Current Every Day Smoker    Packs/day: 0.25    Types: Cigarettes  . Smokeless tobacco: Never Used  Substance and Sexual Activity  . Alcohol use: No  . Drug use: Yes    Types: Cocaine    Comment: percocet and hx of alcohol abuse. denies any since age 64  . Sexual activity: Not on file  Lifestyle  . Physical activity    Days per week: Not on file    Minutes per session: Not on file  . Stress: Not on file  Relationships  . Social Herbalist on phone: Not on file    Gets together: Not on file    Attends religious service: Not on file    Active member of club or organization: Not on file    Attends meetings of clubs or organizations: Not on file    Relationship status: Not on file  . Intimate partner violence    Fear of current  or ex partner: Not on file    Emotionally abused: Not on file    Physically abused: Not on file    Forced sexual activity: Not on file  Other Topics Concern  . Not on file  Social History Narrative  . Not on file    Home Medications:  Medications reconciled in EPIC  No current facility-administered medications on file prior to encounter.    Current Outpatient Medications on File Prior to Encounter  Medication Sig Dispense Refill  . albuterol (VENTOLIN HFA) 108 (90 Base) MCG/ACT inhaler Inhale 1-2 puffs into the lungs every 6 (six) hours as needed for wheezing or shortness of breath.    Marland Kitchen aspirin EC 81 MG tablet Take 81 mg by mouth at bedtime.    . clonazePAM (KLONOPIN) 2 MG tablet Take 1-2 mg by mouth See admin instructions. Take 1 tablet (2 mg) by mouth in the morning, 1 tablet (2 mg) by mouth in the afternoon, 0.5 tablet (1 mg) by mouth at bedtime.    . cloNIDine (CATAPRES) 0.2 MG tablet Take 0.2 mg by mouth at bedtime.    . cyclobenzaprine (FLEXERIL) 5 MG tablet Take 5 mg by mouth at bedtime.    . diphenhydrAMINE-Zinc Acetate (ANTI-ITCH EXTRA STRENGTH EX) Apply 1 application topically 4 (four) times daily as needed (itching.).    Marland Kitchen fenofibrate (TRICOR) 48 MG tablet Take by mouth.    Marland Kitchen ibuprofen (ADVIL) 200 MG tablet Take 600-800 mg by mouth 2 (two) times daily as needed (pain.).    Marland Kitchen ibuprofen (ADVIL) 800 MG tablet Take 1 tablet (800 mg total) by mouth every 6 (six) hours. 45 tablet 1  . insulin aspart (NOVOLOG) 100 UNIT/ML injection Inject 2-10 Units into the skin 3 (three) times daily. Sliding Scale insulin    . Insulin Glargine (BASAGLAR KWIKPEN [place]) Inject 40 Units into the skin.    . Insulin Pen Needle (FIFTY50 PEN NEEDLES) 32G X 4 MM MISC For use with insulin pen    . ketorolac (TORADOL) 30 MG/ML injection Inject 30 mg into the muscle daily as needed (migraine headaches.).    Marland Kitchen methylphenidate (RITALIN) 10 MG tablet Take 30 mg by mouth 2 (two) times daily.    . metoprolol  succinate (TOPROL-XL) 25 MG 24 hr tablet Take 25 mg by mouth every evening.     Marland Kitchen morphine (MSIR) 15 MG tablet Take 1 tablet (15 mg total) by mouth every  4 (four) hours as needed for severe pain. 21 tablet 0  . naphazoline-pheniramine (VISINE-A) 0.025-0.3 % ophthalmic solution Place 1 drop into both eyes 4 (four) times daily as needed for eye irritation.    . nortriptyline (PAMELOR) 10 MG capsule Take 40 mg by mouth at bedtime.     . phenazopyridine (PYRIDIUM) 200 MG tablet Take 1 tablet (200 mg total) by mouth 3 (three) times daily with meals. 10 tablet 0  . prazosin (MINIPRESS) 5 MG capsule Take 10 mg by mouth at bedtime.    . promethazine (PHENERGAN) 12.5 MG tablet Take 1 tablet (12.5 mg total) by mouth every 6 (six) hours as needed for nausea or vomiting. (Patient not taking: Reported on 10/03/2018) 30 tablet 0    Allergies:  Allergies  Allergen Reactions  . Bee Venom Anaphylaxis  . Ciprofloxacin Anaphylaxis  . Zofran [Ondansetron Hcl] Anaphylaxis    Swelling of the throat and redness of the face   . Lactose Intolerance (Gi) Diarrhea    Upset stomach    Physical Exam:  Temp:  [99.3 F (37.4 C)] 99.3 F (37.4 C) (08/30 1756) Pulse Rate:  [86-99] 95 (08/30 2102) Resp:  [17-18] 17 (08/30 1815) BP: (110-137)/(63-84) 137/72 (08/30 2101) SpO2:  [98 %-100 %] 98 % (08/30 2102) Weight:  [78.2 kg] 78.2 kg (08/30 1757)   General Appearance:  Well developed, well nourished, no acute distress, alert and oriented x3 HEENT:  Normocephalic atraumatic, extraocular movements intact, moist mucous membranes Cardiovascular:  Normal S1/S2, regular rate and rhythm, no murmurs Pulmonary:  clear to auscultation, no wheezes, rales or rhonchi, symmetric air entry, good air exchange Abdomen:  Bowel sounds present, soft, nontender, nondistended, no abnormal masses, no epigastric pain Extremities:  Full range of motion, no pedal edema, 2+ distal pulses, no tenderness Skin:  normal coloration and turgor,  no rashes, no suspicious skin lesions noted  Neurologic:   normal muscle tone, strength 5/5 all four extremities Psychiatric:  Normal mood and affect, appropriate, no AH/VH Pelvic:  NEFG, no vulvar masses or lesions, Dried red blood noted on labia and perineum. normal vaginal mucosa with sutures and cuff intact. Scant dark brown mucus discharge noted. no active vaginal bleeding. Foley cath in place, no discharge or bleeding from urethra  Foley catheter back filled with 259ml sterile water, then balloon deflated and catheter removed.    Labs/Studies:   CBC and Coags:  Lab Results  Component Value Date   WBC 14.7 (H) 10/09/2018   NEUTOPHILPCT 69 10/09/2018   EOSPCT 2 10/09/2018   BASOPCT 0 10/09/2018   LYMPHOPCT 25 10/09/2018   HGB 12.3 10/09/2018   HCT 36.5 10/09/2018   MCV 88.4 10/09/2018   PLT 311 10/09/2018   CMP:  Lab Results  Component Value Date   NA 135 10/09/2018   K 3.9 10/09/2018   CL 99 10/09/2018   CO2 24 10/09/2018   BUN 12 10/09/2018   CREATININE 0.88 10/09/2018   CREATININE 1.00 10/03/2018   CREATININE 0.78 09/29/2018   PROT 7.7 10/09/2018   BILITOT 0.2 (L) 10/09/2018   ALT 64 (H) 10/09/2018   AST 49 (H) 10/09/2018   ALKPHOS 90 10/09/2018    Other Imaging: Dg Abd 2 Views  Result Date: 10/09/2018 CLINICAL DATA:  Constipation.  Recent hysterectomy and bladder tack EXAM: ABDOMEN - 2 VIEW COMPARISON:  08/23/2016 FINDINGS: A moderate to large amount of colonic stool is noted. No dilated bowel loops are present. There is no evidence of pneumoperitoneum. No suspicious calcifications or  acute bony abnormalities noted. IMPRESSION: Moderate to large amount of colonic stool which can be seen with constipation. No evidence of bowel obstruction. Electronically Signed   By: Margarette Canada M.D.   On: 10/09/2018 19:10     Assessment / Plan:   KADEIDRA VENTRESS is a 48 y.o. [id number] who presents with constipation and vaginal bleeding at 6 days post-op [facility] and anterior repair.    1. Acute constipation - Fleets enema and dulcolax suppository now - will need to go home with strict bowel regimen including Miralax 1 scoop BID, Colace 2 tabs PO BID.   2. Post-op vaginal hysterectomy - no vaginal bleeding on pelvic exam - urged to keep f/u with Dr Leonides Schanz as scheduled - s/p urinary retention, bladder back filled with 270ml, will measure post-void, if unable to void, will replace foley catheter and DC home.   Thank you for the opportunity to be involved with this pt's care.

## 2018-10-09 NOTE — ED Notes (Signed)
OB at bedside

## 2018-10-09 NOTE — ED Provider Notes (Signed)
-----------------------------------------   11:19 PM on 10/09/2018 -----------------------------------------  Was signed out to me, has some difficulty voiding after procedure.  According to signout if she is a bowel movement and can urinate she is to be going home.  Was able to have a bowel movement, however, in the time allotted she did not urinate.  OB/GYN has been down here and extensively working on their postop patient.  They have informed they want her to go home with a Foley.  We will discharge her at OB/GYN's request.  Patient in no acute distress.  Return precautions follow-up given and understood.  Patient feels much better after having had a bowel movement.  Given that OB/GYN does not want her admitted and she is eager to go home, I will close defer to the surgical services on this disposition.   Schuyler Amor, MD 10/09/18 [phone]

## 2018-10-09 NOTE — ED Notes (Signed)
1 set of cultures drawn and sent to lab.

## 2018-10-09 NOTE — ED Notes (Signed)
Patient states she had small bowel movement and some diarrhea. No urine yet

## 2018-10-09 NOTE — ED Notes (Signed)
Patient threatening to leave AMA to friend on phone, will monitor and let MD know

## 2018-10-09 NOTE — Discharge Instructions (Addendum)
You were seen in the emergency department today for constipation and vaginal bleeding. Follow-up closely with Dr. Leafy Ro and your OBGYN service.  We recommend that you use one or more of the following over-the-counter medications in the order described:   1)  Colace (or Dulcolax) 100 mg:  This is a stool softener, and you may take it once or twice a day as needed. 2)  Senna tablets:  This is a bowel stimulant that will help "push" out your stool. It is the next step to add after you have tried a stool softener. 3)  Miralax (powder):  This medication works by drawing additional fluid into your intestines and helps to flush out your stool.  Mix the powder with water or juice according to label instructions.  It may help if the Colace and Senna are not sufficient, but you must be sure to use the recommended amount of water or juice when you mix up the powder. Remember that narcotic pain medications are constipating, so avoid them or minimize their use.  Drink plenty of fluids.  Please return to the Emergency Department immediately if you develop new or worsening symptoms that concern you, begin vomiting, you have recurrence of your vaginal bleeding, you are unable to urinate, feel your bladder is blocked, or other symptoms such as (but not limited to) fever > 101 degrees, severe abdominal pain, or persistent vomiting.

## 2018-10-09 NOTE — ED Notes (Signed)
Instilling 259mL of sterile water to clamp foley, void trial

## 2018-10-09 NOTE — ED Triage Notes (Signed)
Pt arrives [facility]. Arrives with foley cath in. 6 days ago had a hysterectomy and bladder tack. That happened Monday afternoon. Pt states Monday afternoon returned with fever and urinary retention. States foley was placed. Now with constipation, has tried OTC and prune juice. States she tried to have a BM, states "I was told not to bear down but I had to bear down" states vaginal bleeding began today but states "I got the bleeding under control". Unsure if popped a stitch. Pt has prescription for morphine at home but hasn't been taking it, states "I don't do opioids." Pt states "I have a bowel blockage right now and it's pretty bad."   A&O. Urine noted in foley.

## 2018-10-09 NOTE — ED Notes (Addendum)
Pt states that vaginal bleeding soaked 4 large wet wipes. States she has bleeding stopped. Pt takes she took 2 tylenol at 4:30

## 2018-10-09 NOTE — ED Provider Notes (Signed)
[facility] Emergency Department Provider Note   ____________________________________________   First MD Initiated Contact with Patient 10/09/18 1815     (approximate)  I have reviewed the triage vital signs and the nursing notes.   HISTORY  Chief Complaint Constipation and Vaginal Bleeding    HPI Alejandra Frederick is a 48 y.o. female who had surgery this week for total hysterectomy and bladder sling  Patient reports that tonight she has been straining to defecate, she feels very constipated for last several days, as she was straining she started having some bleeding from her vaginal canal, she describes a small amount but she is a little concerned she could have pulled a stitch out from her surgical site.  She also has a urinary catheter in place, she has been urinating through the clear urine, simply the bag once when it was quite full earlier today and it continues to drain okay.  She does not feel like her bladder is blocked up.  She reports she feels constipated, and her abdominal pain is mild, she has not taken any morphine, she reports that seems to be healing well.  She had a fever a few days ago, but that is gone away.  She still eating and drinking.  She feels overall like she is doing fairly well except she is quite constipated.  [facility]   Past Medical History:  Diagnosis Date  . Anxiety   . Asthma   . Cervical cancer [facility]) 2007   surgical resection to freeze cells.   . Complication of anesthesia    hard to wake up   . Depression   . Diabetes mellitus without complication ([place])   . Gastroparesis   . Headache   . High cholesterol   . Hypertension   . Myocardial infarction ([place]) 2016  . Pulmonary embolus ([place])   . Stroke [facility]) 2013    Patient Active Problem List   Diagnosis Date Noted  . Acute urinary retention 10/03/2018  . Post-operative pain 10/03/2018  . Bipolar I disorder, most recent episode mixed ([place])  08/24/2016  . Acute respiratory failure with hypoxia ([place])   . Accidental drug overdose   . Encephalopathy acute 08/22/2016    Past Surgical History:  Procedure Laterality Date  . CYSTOCELE REPAIR N/A 10/03/2018   Procedure: Anterior Colporrhaphy and Culdoplasty;  Surgeon: Ward, Honor Loh, MD;  Location: [facility] ORS;  Service: Gynecology;  Laterality: N/A;  . LAPAROSCOPIC HYSTERECTOMY N/A 10/03/2018   Procedure: HYSTERECTOMY TOTAL LAPAROSCOPIC, bilateral Salpingectomy;  Surgeon: Ward, Honor Loh, MD;  Location: [facility] ORS;  Service: Gynecology;  Laterality: N/A;  . TONSILLECTOMY    . TUBAL LIGATION      Prior to Admission medications   Medication Sig Start Date End Date Taking? Authorizing Provider  albuterol (VENTOLIN HFA) 108 (90 Base) MCG/ACT inhaler Inhale 1-2 puffs into the lungs every 6 (six) hours as needed for wheezing or shortness of breath.    [provider]  aspirin EC 81 MG tablet Take 81 mg by mouth at bedtime.    [provider]  clonazePAM (KLONOPIN) 2 MG tablet Take 1-2 mg by mouth See admin instructions. Take 1 tablet (2 mg) by mouth in the morning, 1 tablet (2 mg) by mouth in the afternoon, 0.5 tablet (1 mg) by mouth at bedtime.    [provider]  cloNIDine (CATAPRES) 0.2 MG tablet Take 0.2 mg by mouth at bedtime.    [provider]  cyclobenzaprine (FLEXERIL)  5 MG tablet Take 5 mg by mouth at bedtime.    [provider]  diphenhydrAMINE-Zinc Acetate (ANTI-ITCH EXTRA STRENGTH EX) Apply 1 application topically 4 (four) times daily as needed (itching.).    [provider]  fenofibrate (TRICOR) 48 MG tablet Take by mouth. 10/15/16 10/15/17  [provider]  ibuprofen (ADVIL) 200 MG tablet Take 600-800 mg by mouth 2 (two) times daily as needed (pain.).    [provider]  ibuprofen (ADVIL) 800 MG tablet Take 1 tablet (800 mg total) by mouth every 6 (six) hours. 10/03/18   Ward, Honor Loh, MD  insulin aspart  (NOVOLOG) 100 UNIT/ML injection Inject 2-10 Units into the skin 3 (three) times daily. Sliding Scale insulin    [provider]  Insulin Glargine (BASAGLAR KWIKPEN [place]) Inject 40 Units into the skin.    [provider]  Insulin Pen Needle (FIFTY50 PEN NEEDLES) 32G X 4 MM MISC For use with insulin pen 03/19/16   [provider]  ketorolac (TORADOL) 30 MG/ML injection Inject 30 mg into the muscle daily as needed (migraine headaches.).    [provider]  methylphenidate (RITALIN) 10 MG tablet Take 30 mg by mouth 2 (two) times daily.    [provider]  metoprolol succinate (TOPROL-XL) 25 MG 24 hr tablet Take 25 mg by mouth every evening.  10/19/16 09/21/19  [provider]  morphine (MSIR) 15 MG tablet Take 1 tablet (15 mg total) by mouth every 4 (four) hours as needed for severe pain. 10/03/18   Ward, Honor Loh, MD  naphazoline-pheniramine (VISINE-A) 0.025-0.3 % ophthalmic solution Place 1 drop into both eyes 4 (four) times daily as needed for eye irritation.    [provider]  nortriptyline (PAMELOR) 10 MG capsule Take 40 mg by mouth at bedtime.     [provider]  phenazopyridine (PYRIDIUM) 200 MG tablet Take 1 tablet (200 mg total) by mouth 3 (three) times daily with meals. 10/03/18   Ward, Honor Loh, MD  prazosin (MINIPRESS) 5 MG capsule Take 10 mg by mouth at bedtime.    [provider]  promethazine (PHENERGAN) 12.5 MG tablet Take 1 tablet (12.5 mg total) by mouth every 6 (six) hours as needed for nausea or vomiting. Patient not taking: Reported on 10/03/2018 04/07/17   Gregor Hams, MD    Allergies Bee venom, Ciprofloxacin, Zofran [ondansetron hcl], and Lactose intolerance (gi)  History reviewed. No pertinent family history.  Social History Social History   Tobacco Use  . Smoking status: Current Every Day Smoker    Packs/day: 0.25    Types: Cigarettes  . Smokeless tobacco: Never Used  Substance Use Topics   . Alcohol use: No  . Drug use: Yes    Types: Cocaine    Comment: percocet and hx of alcohol abuse. denies any since age 32    Review of Systems Constitutional: No fever/chills except a few days ago when she was seen and kept overnight for fever Eyes: No visual changes. ENT: No sore throat. Cardiovascular: Denies chest pain. Respiratory: Denies shortness of breath. Gastrointestinal: No abdominal pain except she just feels very constipated.   Genitourinary: Negative for dysuria.  See HPI, some vaginal bleeding, reports that blood slightly after trying to defecate, but this bleeding is stopped Musculoskeletal: Negative for back pain. Skin: Negative for rash. Neurological: Negative for headaches, areas of focal weakness or numbness.    ____________________________________________   PHYSICAL EXAM:  VITAL SIGNS: ED Triage Vitals  Enc Vitals  Group     BP 10/09/18 1756 132/63     Pulse Rate 10/09/18 1756 99     Resp 10/09/18 1756 18     Temp 10/09/18 1756 99.3 F (37.4 C)     Temp Source 10/09/18 1756 Oral     SpO2 10/09/18 1756 98 %     Weight 10/09/18 1757 172 lb 8 oz (78.2 kg)     Height 10/09/18 1757 5\' 4"  (1.626 m)     Head Circumference --      Peak Flow --      Pain Score 10/09/18 1756 10     Pain Loc --      Pain Edu? --      Excl. in [place]? --     Constitutional: Alert and oriented. Well appearing and in no acute distress. Eyes: Conjunctivae are normal. Head: Atraumatic. Nose: No congestion/rhinnorhea. Mouth/Throat: Mucous membranes are moist. Neck: No stridor.  Cardiovascular: Normal rate, regular rhythm. Grossly normal heart sounds.  Good peripheral circulation. Respiratory: Normal respiratory effort.  No retractions. Lungs CTAB. Gastrointestinal: Soft and nontender. No distention.  Trocar sites clean and intact, she has some bruising on the left sided trocar site she reports been present for several days.  Foley catheter in place, draining clear yellow urine.   Deferred GYN perineal exam to nurse midwife who performed this during consult. Musculoskeletal: No lower extremity tenderness nor edema. Neurologic:  Normal speech and language. No gross focal neurologic deficits are appreciated.  Skin:  Skin is warm, dry and intact. No rash noted. Psychiatric: Mood and affect are normal. Speech and behavior are normal.  ____________________________________________   LABS (all labs ordered are listed, but only abnormal results are displayed)  Labs Reviewed  LACTIC ACID, PLASMA - Abnormal; Notable for the following components:      Result Value   Lactic Acid, Venous 2.9 (*)    All other components within normal limits  COMPREHENSIVE METABOLIC PANEL - Abnormal; Notable for the following components:   Glucose, Bld 183 (*)    AST 49 (*)    ALT 64 (*)    Total Bilirubin 0.2 (*)    All other components within normal limits  CBC WITH DIFFERENTIAL/PLATELET - Abnormal; Notable for the following components:   WBC 14.7 (*)    Neutro Abs 10.1 (*)    All other components within normal limits  URINALYSIS, COMPLETE (UACMP) WITH MICROSCOPIC - Abnormal; Notable for the following components:   Color, Urine STRAW (*)    APPearance CLEAR (*)    Specific Gravity, Urine 1.004 (*)    All other components within normal limits  LACTIC ACID, PLASMA  SAMPLE TO BLOOD BANK   ____________________________________________  EKG   ____________________________________________  RADIOLOGY  Dg Abd 2 Views  Result Date: 10/09/2018 CLINICAL DATA:  Constipation.  Recent hysterectomy and bladder tack EXAM: ABDOMEN - 2 VIEW COMPARISON:  08/23/2016 FINDINGS: A moderate to large amount of colonic stool is noted. No dilated bowel loops are present. There is no evidence of pneumoperitoneum. No suspicious calcifications or acute bony abnormalities noted. IMPRESSION: Moderate to large amount of colonic stool which can be seen with constipation. No evidence of bowel obstruction.  Electronically Signed   By: Margarette Canada M.D.   On: 10/09/2018 19:10    Imaging reviewed.  No evidence of acute ileus or obstruction ____________________________________________   PROCEDURES  Procedure(s) performed: None  Procedures  Critical Care performed: No  ____________________________________________   INITIAL IMPRESSION / ASSESSMENT AND PLAN /  ED COURSE  Pertinent labs & imaging results that were available during my care of the patient were reviewed by me and considered in my medical decision making (see chart for details).   Patient presents for vaginal bleeding after straining to defecate.  She has been feeling quite constipated using Colace and MiraLAX at home but continues to feel constipated after surgery.  She does pass gas, she is not had any obvious signs of obstruction though certainly ileus obstruction would remain on her differential.  Her x-ray however is reassuring, demonstrates what appears to be significant constipation likely leading to her feeling of being constipated.  She has no fever here, she does demonstrate leukocytosis but is improving from when she was admitted.  She is hemodynamically stable in no acute distress.  I do not see signs of postoperative infection at this point.  She is been admitted to the hospital recently, but reports some vaginal bleeding will be evaluated by OB/GYN consultation.  Clinical Course as of Oct 09 2039  Nancy Fetter Oct 09, 2018  1851 Discsused case with Dr. Ouida Sills, will have OBGYN service see patient in consult in ER.    [MQ]  [place] seeing patient.   [MQ]  [address], OBGYN saw patient and advises removed foley, trial voiding, and give enema. If able to urinary and has bowel movement after enema, ok to discharge and follow-up closely with OBGYN team. Will enter consult note.    [MQ]    Clinical Course User Index [MQ] Delman Kitten, MD   Appreciate OB/GYN consult.  Ongoing care including follow-up after trial  of voiding and enema as written by OB/GYN service assigned to Dr. Burlene Arnt for ER follow-up.  Tone Millage was evaluated in Emergency Department on 10/09/2018 for the symptoms described in the history of present illness. She was evaluated in the context of the global COVID-19 pandemic, which necessitated consideration that the patient might be at risk for infection with the SARS-CoV-2 virus that causes COVID-19. Institutional protocols and algorithms that pertain to the evaluation of patients at risk for COVID-19 are in a state of rapid change based on information released by regulatory bodies including the CDC and federal and state organizations. These policies and algorithms were followed during the patient's care in the ED.   ____________________________________________   FINAL CLINICAL IMPRESSION(S) / ED DIAGNOSES  Final diagnoses:  Vaginal bleeding  Post-operative state  Encounter for Foley catheter removal  Slow transit constipation        Note:  This document was prepared using Dragon voice recognition software and may include unintentional dictation errors       Delman Kitten, MD 10/09/18 2043

## 2018-11-30 ENCOUNTER — Other Ambulatory Visit: Payer: Self-pay | Admitting: Internal Medicine

## 2018-11-30 DIAGNOSIS — Z1231 Encounter for screening mammogram for malignant neoplasm of breast: Secondary | ICD-10-CM

## 2018-12-11 ENCOUNTER — Other Ambulatory Visit: Payer: Self-pay

## 2018-12-11 ENCOUNTER — Emergency Department
Admission: EM | Admit: 2018-12-11 | Discharge: 2018-12-11 | Disposition: A | Discharge status: Home / Self Care | Payer: Medicare Other | Attending: Emergency Medicine | Admitting: Emergency Medicine

## 2018-12-11 ENCOUNTER — Emergency Department: Payer: Medicare Other

## 2018-12-11 ENCOUNTER — Encounter: Payer: Self-pay | Admitting: *Deleted

## 2018-12-11 ENCOUNTER — Emergency Department
Admission: EM | Admit: 2018-12-11 | Discharge: 2018-12-11 | Disposition: A | Discharge status: Home / Self Care | Payer: Medicare Other

## 2018-12-11 DIAGNOSIS — J029 Acute pharyngitis, unspecified: Secondary | ICD-10-CM | POA: Insufficient documentation

## 2018-12-11 DIAGNOSIS — R079 Chest pain, unspecified: Secondary | ICD-10-CM | POA: Diagnosis not present

## 2018-12-11 DIAGNOSIS — I10 Essential (primary) hypertension: Secondary | ICD-10-CM | POA: Insufficient documentation

## 2018-12-11 DIAGNOSIS — R1010 Upper abdominal pain, unspecified: Secondary | ICD-10-CM | POA: Insufficient documentation

## 2018-12-11 DIAGNOSIS — E1165 Type 2 diabetes mellitus with hyperglycemia: Secondary | ICD-10-CM | POA: Insufficient documentation

## 2018-12-11 DIAGNOSIS — Z8541 Personal history of malignant neoplasm of cervix uteri: Secondary | ICD-10-CM | POA: Diagnosis not present

## 2018-12-11 DIAGNOSIS — R05 Cough: Secondary | ICD-10-CM | POA: Insufficient documentation

## 2018-12-11 DIAGNOSIS — F17210 Nicotine dependence, cigarettes, uncomplicated: Secondary | ICD-10-CM | POA: Diagnosis not present

## 2018-12-11 DIAGNOSIS — Z7982 Long term (current) use of aspirin: Secondary | ICD-10-CM | POA: Insufficient documentation

## 2018-12-11 DIAGNOSIS — Z79899 Other long term (current) drug therapy: Secondary | ICD-10-CM | POA: Insufficient documentation

## 2018-12-11 DIAGNOSIS — Z8673 Personal history of transient ischemic attack (TIA), and cerebral infarction without residual deficits: Secondary | ICD-10-CM | POA: Insufficient documentation

## 2018-12-11 DIAGNOSIS — J45909 Unspecified asthma, uncomplicated: Secondary | ICD-10-CM | POA: Diagnosis not present

## 2018-12-11 DIAGNOSIS — R109 Unspecified abdominal pain: Secondary | ICD-10-CM

## 2018-12-11 DIAGNOSIS — Z20828 Contact with and (suspected) exposure to other viral communicable diseases: Secondary | ICD-10-CM | POA: Insufficient documentation

## 2018-12-11 DIAGNOSIS — R739 Hyperglycemia, unspecified: Secondary | ICD-10-CM

## 2018-12-11 DIAGNOSIS — R059 Cough, unspecified: Secondary | ICD-10-CM

## 2018-12-11 LAB — URINALYSIS, COMPLETE (UACMP) WITH MICROSCOPIC
Bacteria, UA: NONE SEEN
Bilirubin Urine: NEGATIVE
Glucose, UA: >=500 mg/dL — AB
Ketones, ur: 5 mg/dL — AB
Leukocytes,Ua: NEGATIVE
Nitrite: NEGATIVE
Protein, ur: NEGATIVE mg/dL
Specific Gravity, Urine: 1.028 (ref 1.005–1.030)
pH: 5 (ref 5.0–8.0)

## 2018-12-11 LAB — CBC WITH DIFFERENTIAL/PLATELET
Abs Immature Granulocytes: 0.03 10*3/uL (ref 0.00–0.07)
Basophils Absolute: 0 10*3/uL (ref 0.0–0.1)
Basophils Relative: 0 %
Eosinophils Absolute: 0.1 10*3/uL (ref 0.0–0.5)
Eosinophils Relative: 1 %
HCT: 38.6 % (ref 36.0–46.0)
Hemoglobin: 12.9 g/dL (ref 12.0–15.0)
Immature Granulocytes: 0 %
Lymphocytes Relative: 24 %
Lymphs Abs: 2.7 10*3/uL (ref 0.7–4.0)
MCH: 29.3 pg (ref 26.0–34.0)
MCHC: 33.4 g/dL (ref 30.0–36.0)
MCV: 87.7 fL (ref 80.0–100.0)
Monocytes Absolute: 0.5 10*3/uL (ref 0.1–1.0)
Monocytes Relative: 5 %
Neutro Abs: 7.7 10*3/uL (ref 1.7–7.7)
Neutrophils Relative %: 70 %
Platelets: 246 10*3/uL (ref 150–400)
RBC: 4.4 MIL/uL (ref 3.87–5.11)
RDW: 13.2 % (ref 11.5–15.5)
WBC: 11.1 10*3/uL — ABNORMAL HIGH (ref 4.0–10.5)
nRBC: 0 % (ref 0.0–0.2)

## 2018-12-11 LAB — COMPREHENSIVE METABOLIC PANEL
ALT: 29 U/L (ref 0–44)
AST: 28 U/L (ref 15–41)
Albumin: 4.5 g/dL (ref 3.5–5.0)
Alkaline Phosphatase: 76 U/L (ref 38–126)
Anion gap: 14 (ref 5–15)
BUN: 14 mg/dL (ref 6–20)
CO2: 20 mmol/L — ABNORMAL LOW (ref 22–32)
Calcium: 9.6 mg/dL (ref 8.9–10.3)
Chloride: 100 mmol/L (ref 98–111)
Creatinine, Ser: 0.86 mg/dL (ref 0.44–1.00)
GFR calc Af Amer: >60 mL/min (ref >60)
GFR calc non Af Amer: >60 mL/min (ref >60)
Glucose, Bld: 274 mg/dL — ABNORMAL HIGH (ref 70–99)
Potassium: 4.1 mmol/L (ref 3.5–5.1)
Sodium: 134 mmol/L — ABNORMAL LOW (ref 135–145)
Total Bilirubin: 0.8 mg/dL (ref 0.3–1.2)
Total Protein: 7.8 g/dL (ref 6.5–8.1)

## 2018-12-11 LAB — LACTIC ACID, PLASMA
Lactic Acid, Venous: 2.3 mmol/L (ref 0.5–1.9)
Lactic Acid, Venous: 1.8 mmol/L (ref 0.5–1.9)

## 2018-12-11 LAB — TROPONIN I (HIGH SENSITIVITY)
Troponin I (High Sensitivity): 3 ng/L (ref <18)
Troponin I (High Sensitivity): 6 ng/L (ref <18)

## 2018-12-11 LAB — FIBRIN DERIVATIVES D-DIMER (ARMC ONLY): Fibrin derivatives D-dimer (ARMC): 214.53 ng/mL (FEU) (ref 0.00–499.00)

## 2018-12-11 MED ORDER — SODIUM CHLORIDE 0.9 % IV BOLUS
1000.0000 mL | Freq: Once | INTRAVENOUS | Status: AC
  Administered 2018-12-11: 1000 mL via INTRAVENOUS

## 2018-12-11 MED ORDER — FENTANYL CITRATE (PF) 100 MCG/2ML IJ SOLN
100.0000 ug | Freq: Once | INTRAMUSCULAR | Status: AC
  Administered 2018-12-11: 100 ug via INTRAVENOUS
  Filled 2018-12-11: qty 2

## 2018-12-11 NOTE — ED Triage Notes (Addendum)
Pt to ED via EMS. Pt reportign hematuria and flank pain. Fevers since August per pt. Pt is afebrile upon arrival but reports having taken tylenol prior to calling EMS. Stage 3 renal failure. Neck pain and back pain x 3 weeks.   Pt also reporting blurred vision and unstable gait x 1 week. No neuro deficits noted upon arrival. Pt able to stand to transfer from EMS stretcher to wheelchair.   EMS vitals 140 ST 220/112 320 cbg Pt talking upon arrival and A&OX4.

## 2018-12-11 NOTE — ED Provider Notes (Signed)
[facility] Emergency Department Provider Note ____________________________________________   None    (approximate)  I have reviewed the triage vital signs and the nursing notes.   HISTORY  Chief Complaint Flank Pain  HPI Alejandra Frederick is a 48 y.o. female who presents to the emergency department for treatment and evaluation of multiple medical complaints.  She complains of cough, chest pressure, dysuria, flank pain, and hematuria. For the past 3 weeks, she has had intermittent fevers. She noticed hematuria 2 days ago which is worse today. She had a hysterectomy in August and then urinary retention that required foley catheter x 2 weeks with an associated cystitis. She states that she hasn't felt herself since the surgery in August.   She also states that despite taking her BP medications, it continues to run very high. She is working with Dr. Ginette Pitman who recently made some changes to her medications. She feels this may be contributing to blurred vision and chest pressure.  Past Medical History:  Diagnosis Date   Anxiety    Asthma    Cervical cancer ([place]) 2007   surgical resection to freeze cells.    Complication of anesthesia    hard to wake up    Depression    Diabetes mellitus without complication (HCC)    Gastroparesis    Headache    High cholesterol    Hypertension    Myocardial infarction [facility]) 2016   Pulmonary embolus ([place])    Stroke [facility]) 2013    Patient Active Problem List   Diagnosis Date Noted   Acute urinary retention 10/03/2018   Post-operative pain 10/03/2018   Bipolar I disorder, most recent episode mixed ([place]) 08/24/2016   Acute respiratory failure with hypoxia (HCC)    Accidental drug overdose    Encephalopathy acute 08/22/2016    Past Surgical History:  Procedure Laterality Date   CYSTOCELE REPAIR N/A 10/03/2018   Procedure: Anterior Colporrhaphy and Culdoplasty;  Surgeon: Ward, Honor Loh, MD;  Location:  [facility] ORS;  Service: Gynecology;  Laterality: N/A;   LAPAROSCOPIC HYSTERECTOMY N/A 10/03/2018   Procedure: HYSTERECTOMY TOTAL LAPAROSCOPIC, bilateral Salpingectomy;  Surgeon: Ward, Honor Loh, MD;  Location: [facility] ORS;  Service: Gynecology;  Laterality: N/A;   TONSILLECTOMY     TUBAL LIGATION      Prior to Admission medications   Medication Sig Start Date End Date Taking? Authorizing Provider  albuterol (VENTOLIN HFA) 108 (90 Base) MCG/ACT inhaler Inhale 1-2 puffs into the lungs every 6 (six) hours as needed for wheezing or shortness of breath.    [provider]  aspirin EC 81 MG tablet Take 81 mg by mouth at bedtime.    [provider]  clonazePAM (KLONOPIN) 2 MG tablet Take 1-2 mg by mouth See admin instructions. Take 1 tablet (2 mg) by mouth in the morning, 1 tablet (2 mg) by mouth in the afternoon, 0.5 tablet (1 mg) by mouth at bedtime.    [provider]  cloNIDine (CATAPRES) 0.2 MG tablet Take 0.2 mg by mouth at bedtime.    [provider]  cyclobenzaprine (FLEXERIL) 5 MG tablet Take 5 mg by mouth at bedtime.    [provider]  diphenhydrAMINE-Zinc Acetate (ANTI-ITCH EXTRA STRENGTH EX) Apply 1 application topically 4 (four) times daily as needed (itching.).    [provider]  fenofibrate (TRICOR) 48 MG tablet Take by mouth. 10/15/16 10/15/17  [provider]  ibuprofen (ADVIL) 200 MG tablet Take 600-800 mg by mouth 2 (two) times daily  as needed (pain.).    [provider]  ibuprofen (ADVIL) 800 MG tablet Take 1 tablet (800 mg total) by mouth every 6 (six) hours. 10/03/18   Ward, Honor Loh, MD  insulin aspart (NOVOLOG) 100 UNIT/ML injection Inject 2-10 Units into the skin 3 (three) times daily. Sliding Scale insulin    [provider]  Insulin Glargine (BASAGLAR KWIKPEN [place]) Inject 40 Units into the skin.    [provider]  Insulin Pen Needle (FIFTY50 PEN NEEDLES) 32G X 4 MM MISC For use with insulin pen  03/19/16   [provider]  ketorolac (TORADOL) 30 MG/ML injection Inject 30 mg into the muscle daily as needed (migraine headaches.).    [provider]  methylphenidate (RITALIN) 10 MG tablet Take 30 mg by mouth 2 (two) times daily.    [provider]  metoprolol succinate (TOPROL-XL) 25 MG 24 hr tablet Take 25 mg by mouth every evening.  10/19/16 09/21/19  [provider]  morphine (MSIR) 15 MG tablet Take 1 tablet (15 mg total) by mouth every 4 (four) hours as needed for severe pain. 10/03/18   Ward, Honor Loh, MD  naphazoline-pheniramine (VISINE-A) 0.025-0.3 % ophthalmic solution Place 1 drop into both eyes 4 (four) times daily as needed for eye irritation.    [provider]  nortriptyline (PAMELOR) 10 MG capsule Take 40 mg by mouth at bedtime.     [provider]  phenazopyridine (PYRIDIUM) 200 MG tablet Take 1 tablet (200 mg total) by mouth 3 (three) times daily with meals. 10/03/18   Ward, Honor Loh, MD  prazosin (MINIPRESS) 5 MG capsule Take 10 mg by mouth at bedtime.    [provider]  promethazine (PHENERGAN) 12.5 MG tablet Take 1 tablet (12.5 mg total) by mouth every 6 (six) hours as needed for nausea or vomiting. Patient not taking: Reported on 10/03/2018 04/07/17   Gregor Hams, MD    Allergies Bee venom, Ciprofloxacin, Zofran [ondansetron hcl], and Lactose intolerance (gi)  History reviewed. No pertinent family history.  Social History Social History   Tobacco Use   Smoking status: Current Every Day Smoker    Packs/day: 0.25    Types: Cigarettes   Smokeless tobacco: Never Used  Substance Use Topics   Alcohol use: No   Drug use: Yes    Types: Cocaine    Comment: percocet and hx of alcohol abuse. denies any since age 89    Review of Systems  Constitutional: No fever/chills Eyes: Positive for intermittent blurred vision. (not currently present). ENT: No sore throat. Cardiovascular: Positive for  midsternal chest pressure. Respiratory: Positive shortness of breath. Gastrointestinal: No abdominal pain. Positive for post tussive emesis.  No diarrhea.  No constipation. Genitourinary: Positive for dysuria. Musculoskeletal: positive for flank pain. Skin: Negative for rash. Neurological: Negative for headaches, focal weakness or numbness. ___________________________________________   PHYSICAL EXAM:  VITAL SIGNS: ED Triage Vitals  Enc Vitals Group     BP 12/11/18 1157 (!) 151/94     Pulse Rate 12/11/18 1157 (!) 134     Resp 12/11/18 1157 (!) 21     Temp 12/11/18 1157 98.3 F (36.8 C)     Temp Source 12/11/18 1157 Oral     SpO2 12/11/18 1157 100 %     Weight --      Height --      Head Circumference --      Peak Flow --      Pain Score 12/11/18 1152 10  Pain Loc --      Pain Edu? --      Excl. in [place]? --     Constitutional: Alert and oriented. Overall well appearing and in no acute distress. Eyes: Conjunctivae are normal. PERRL. EOMI. Head: Atraumatic. Nose: No congestion/rhinnorhea. Mouth/Throat: Mucous membranes are moist.  Oropharynx non-erythematous. Neck: No stridor.   Hematological/Lymphatic/Immunilogical: No cervical lymphadenopathy. Cardiovascular: Normal rate, regular rhythm. Grossly normal heart sounds.  Good peripheral circulation. Respiratory: Normal respiratory effort.  No retractions. Lungs CTAB. Gastrointestinal: Soft and nontender. No distention. No abdominal bruits. Bilateral CVA tenderness. Genitourinary:  Musculoskeletal: No lower extremity tenderness nor edema.  No joint effusions. Neurologic:  Normal speech and language. No gross focal neurologic deficits are appreciated. No gait instability. Skin:  Skin is warm, dry and intact. No rash noted. Psychiatric: Mood and affect are normal. Speech and behavior are normal.  ____________________________________________   LABS (all labs ordered are listed, but only abnormal results are  displayed)  Labs Reviewed  COMPREHENSIVE METABOLIC PANEL - Abnormal; Notable for the following components:      Result Value   Sodium 134 (*)    CO2 20 (*)    Glucose, Bld 274 (*)    All other components within normal limits  LACTIC ACID, PLASMA - Abnormal; Notable for the following components:   Lactic Acid, Venous 2.3 (*)    All other components within normal limits  CBC WITH DIFFERENTIAL/PLATELET - Abnormal; Notable for the following components:   WBC 11.1 (*)    All other components within normal limits  URINALYSIS, COMPLETE (UACMP) WITH MICROSCOPIC - Abnormal; Notable for the following components:   Color, Urine YELLOW (*)    APPearance CLOUDY (*)    Glucose, UA >=500 (*)    Hgb urine dipstick SMALL (*)    Ketones, ur 5 (*)    All other components within normal limits  SARS CORONAVIRUS 2 (TAT 6-24 HRS)  LACTIC ACID, PLASMA  FIBRIN DERIVATIVES D-DIMER ([facility] ONLY)  TROPONIN I (HIGH SENSITIVITY)  TROPONIN I (HIGH SENSITIVITY)   ____________________________________________  EKG  ED ECG REPORT I, Azavier Creson, FNP-BC personally viewed and interpreted this ECG.   Date: 12/11/2018  Rate: 137  Rhythm: sinus tachycardia  Axis: normal  Intervals:none  ST&T Change: no ST elevation  ____________________________________________  RADIOLOGY  ED MD interpretation:    Chest xray shows no cardiopulmonary abnormality per radiology.   CT for renal stone is negative for acute findings  Official radiology report(s): Dg Chest Portable 1 View  Result Date: 12/11/2018 CLINICAL DATA:  Cough and fever.  Chest pressure. EXAM: PORTABLE CHEST 1 VIEW COMPARISON:  11/04/2017 FINDINGS: The heart size and mediastinal contours are within normal limits. Both lungs are clear. The visualized skeletal structures are unremarkable. IMPRESSION: No active disease. Electronically Signed   By: Marlaine Hind M.D.   On: 12/11/2018 13:17   Ct Renal Stone Study  Result Date: 12/11/2018 CLINICAL DATA:   Flank pain for 3 weeks. Hematuria. Fever. Stage 3 chronic kidney disease. EXAM: CT ABDOMEN AND PELVIS WITHOUT CONTRAST TECHNIQUE: Multidetector CT imaging of the abdomen and pelvis was performed following the standard protocol without IV contrast. COMPARISON:  05/24/2017 FINDINGS: Lower chest: No acute findings. Hepatobiliary: No mass visualized on this unenhanced exam. Gallbladder is unremarkable. No evidence of biliary ductal dilatation. Pancreas: No mass or inflammatory process visualized on this unenhanced exam. Spleen:  Within normal limits in size. Adrenals/Urinary tract: No evidence of urolithiasis or hydronephrosis. Unremarkable unopacified urinary bladder. Stomach/Bowel: No evidence of  obstruction, inflammatory process, or abnormal fluid collections. Normal appendix visualized. Vascular/Lymphatic: No pathologically enlarged lymph nodes identified. No evidence of abdominal aortic aneurysm. Aortic atherosclerosis. Reproductive: Previous hysterectomy. A cystic lesion with at least 1 thin septation is seen in the right adnexa which measures 4.4 x 3.0 cm and is new since previous study. This is likely ovarian in etiology. No evidence of inflammatory changes or free fluid. Other:  None. Musculoskeletal:  No suspicious bone lesions identified. IMPRESSION: No evidence of urolithiasis or hydronephrosis. 4.4 cm probably benign cystic lesion in right adnexa, likely ovarian in etiology. Recommend follow-up with pelvic ultrasound in 6-12 weeks. This recommendation follows ACR consensus guidelines: White Paper of the ACR Incidental Findings Committee II on Adnexal Findings. J Am Coll Radiol [phone]. Aortic Atherosclerosis (ICD10-I70.0). Electronically Signed   By: Marlaine Hind M.D.   On: 12/11/2018 14:35    ____________________________________________   PROCEDURES  Procedure(s) performed (including Critical Care):  Procedures  ____________________________________________   INITIAL IMPRESSION /  ASSESSMENT AND PLAN     48 year old female presenting to the emergency department for treatment and evaluation of multiple medical complaints. See HPI for details.   DIFFERENTIAL DIAGNOSIS includes, but not limited to:  Sepsis, acute cystitis, pyelonephritis, DKA, PE, pneumonia, nephrolithiasis  ED COURSE  Initial lactic is 2.3.  Very mild leukocytosis at 11.1 without any left shift.  No evidence of dehydration or acute/chronic renal failure with a BUN of 14 and a creatinine of 0.86.  Electrolytes are stable.  No indication of diabetic keto acidosis as glucose is 274 and anion gap is 14.  Awaiting urinalysis.  Have advised the RN staff to perform in and out cath.  ----------------------------------------- 2:52 PM on 12/11/2018 -----------------------------------------  Chest x-ray is negative for acute cardiopulmonary abnormalities.  CT does not show any indication of acute disease process.  Patient and husband were updated on reassuring results.   ----------------------------------------- 3:32 PM on 12/11/2018 -----------------------------------------  Repeat lactic acid is now normal at 1.8.  Urinalysis shows no concern of acute cystitis or pyelonephritis.  There is a very small amount of hemoglobin which is likely secondary to In-N-Out cath procedure.  There is no gross hematuria.  Upon reevaluation of the patient, she states that she feels better after fluids.  Her heart rate has decreased to 98 and her blood pressure has gone down to 131/84.  She remains afebrile with 100% O2 saturation.  Based on her reassuring exam and testing, she will be discharged home.  Covid screening remains pending and she will be advised to quarantine until results are back.  If positive, she will quarantine yourself for 2 weeks.  If negative, she will stay home until all of her symptoms have resolved where she is cleared by her primary care provider.  Either way, she is to call and schedule a follow-up  appointment.  She was advised that she should return to the emergency department for symptoms of concern.  She is also to monitor her blood sugar closely and follow a diabetic diet.   ____________________________________________   FINAL CLINICAL IMPRESSION(S) / ED DIAGNOSES  Final diagnoses:  Flank pain  Cough  Pharyngitis, unspecified etiology  Hyperglycemia     ED Discharge Orders    None       Note:  This document was prepared using Dragon voice recognition software and may include unintentional dictation errors.   Victorino Dike, FNP 12/11/18 1845    Vanessa Melbourne, MD 12/11/18 2155

## 2018-12-11 NOTE — Discharge Instructions (Signed)
Please call and schedule follow-up appointment with your primary care provider to discuss diabetic diet and nutrition.  Take your medications as prescribed without skipping any doses.  Return to the emergency department for symptoms of change or worsen if you are unable to schedule an appointment.

## 2018-12-11 NOTE — ED Notes (Signed)
Dr Charna Archer notified of lactic of 2.3. no new orders at this time

## 2018-12-11 NOTE — ED Notes (Signed)
Pt refusing to have CT. Pt reporting she can not lay flat at this time without pain medication.

## 2018-12-12 LAB — SARS CORONAVIRUS 2 (TAT 6-24 HRS): SARS Coronavirus 2: NEGATIVE

## 2019-01-02 ENCOUNTER — Ambulatory Visit: Payer: Medicare Other | Admitting: *Deleted

## 2019-07-08 IMAGING — MR MR HEAD W/O CM
10 series · 40 of 48 positions shown · non-contrast
Comparison: Head CT without contrast 03/21/2017 and earlier.

CLINICAL DATA: 47-year-old female with increased frequency of
chronic migraine-type headaches following traumatic head injury in
February 2016.

EXAM:
MRI HEAD WITHOUT CONTRAST
TECHNIQUE: Multiplanar, multiecho pulse sequences of the brain and surrounding
structures were obtained without intravenous contrast.

[Series 2: T1 · sagittal · 5.0mm · 0.47mm/px · 1 of 22 slices shown (1 of 2)]
[im 1/22]
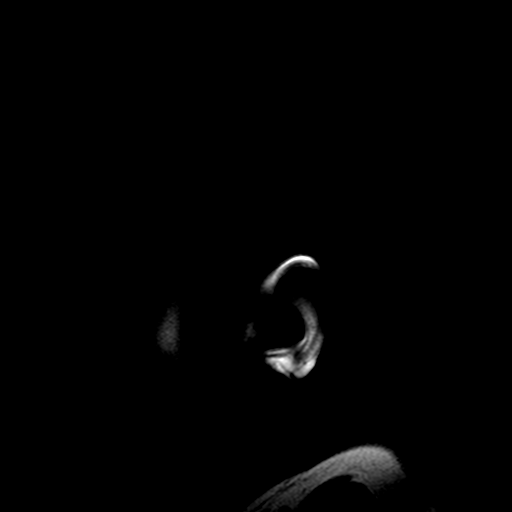

[Series 4: DWI · axial · 3.0mm · 0.94mm/px · z∈[-65,+81]mm · 5 of 50 slices shown (1 of 2)]
[im 1/50]
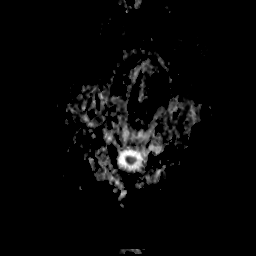
[im 13/50]
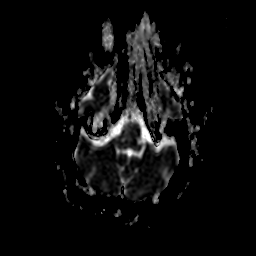
[im 25/50]
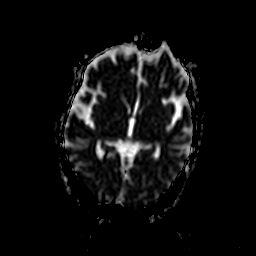
[im 37/50]
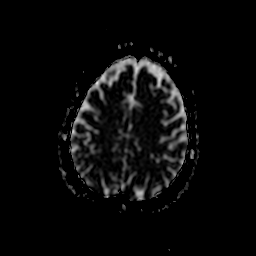
[im 50/50]
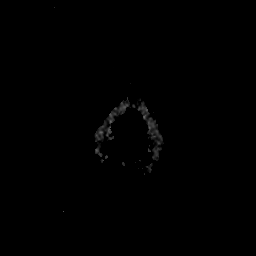

[Series 7: ax (id) · axial · 3.0mm · 0.94mm/px · z∈[-65,+81]mm · 5 of 50 slices shown]
[im 1/50]
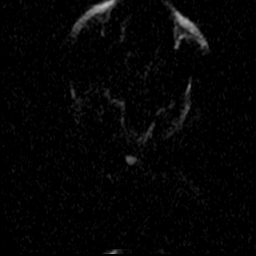
[im 13/50]
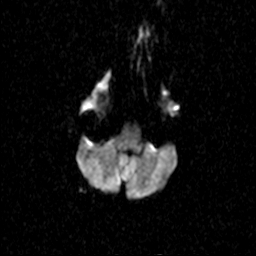
[im 25/50]
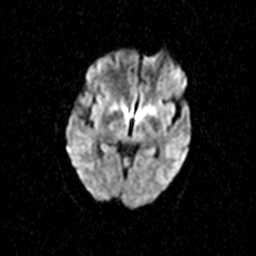
[im 37/50]
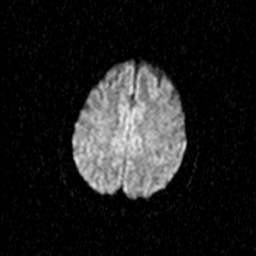
[im 50/50]
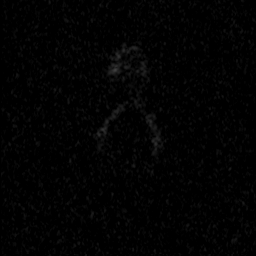

[Series 8: DWI · coronal · 5.0mm · 1.80mm/px · 4 of 38 slices shown (2 of 2)]
[im 1/38]
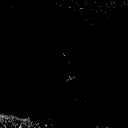
[im 13/38]
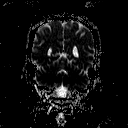
[im 25/38]
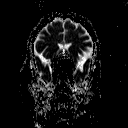
[im 38/38]
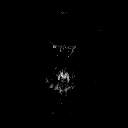

[Series 9: cor (id)- · coronal · 5.0mm · 1.80mm/px · 4 of 38 slices shown]
[im 1/38]
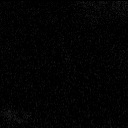
[im 13/38]
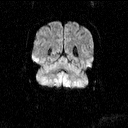
[im 25/38]
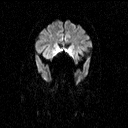
[im 38/38]
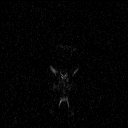

[Series 10: T2 · axial · 5.0mm · 0.45mm/px · z∈[-75,+78]mm · 2 of 23 slices shown (1 of 3)]
[im 1/23]
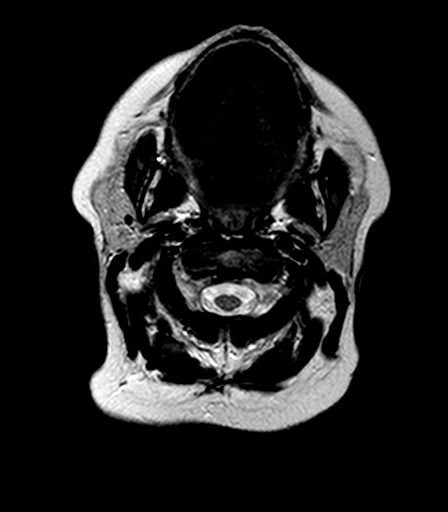
[im 23/23]
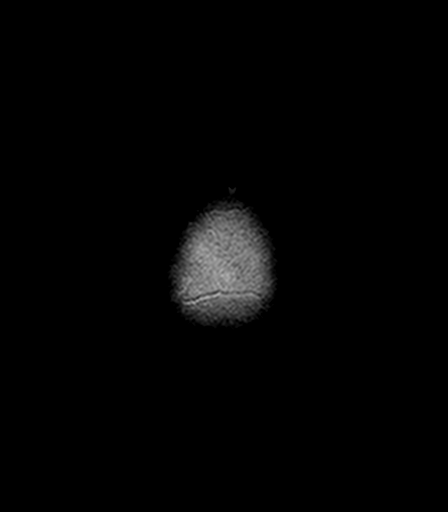

[Series 11: FLAIR · axial · 3.0mm · 0.90mm/px · z∈[-71,+76]mm · 5 of 50 slices shown]
[im 1/50]
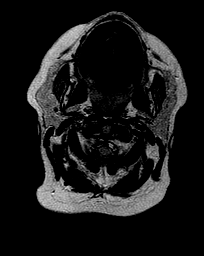
[im 13/50]
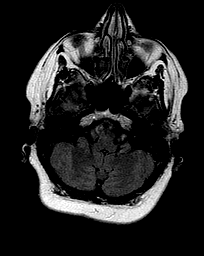
[im 25/50]
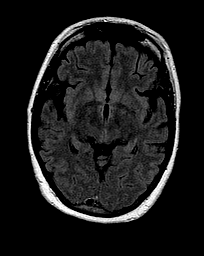
[im 37/50]
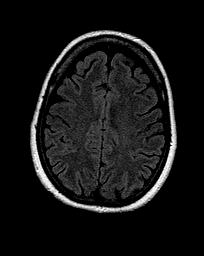
[im 50/50]
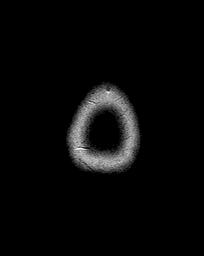

[Series 12: T2 · axial · 5.0mm · 0.45mm/px · z∈[-75,+78]mm · 2 of 23 slices shown (2 of 3)]
[im 1/23]
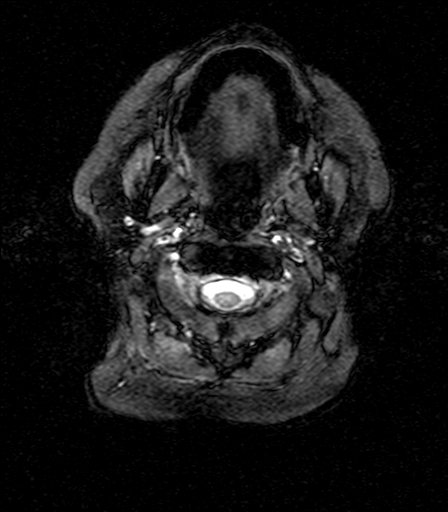
[im 23/23]
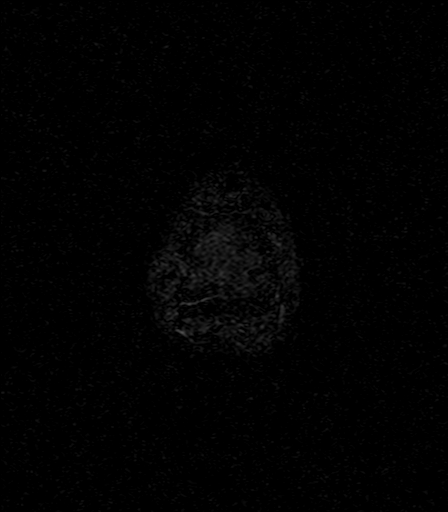

[Series 13: T1 · axial · 1.0mm · 0.50mm/px · z∈[-89,+75]mm · 9 of 176 slices shown (2 of 2)]
[im 1/176]
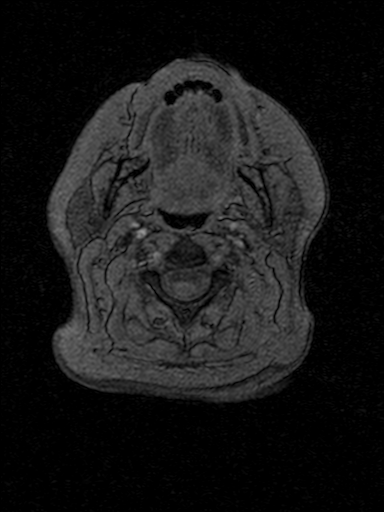
[im 11/176]
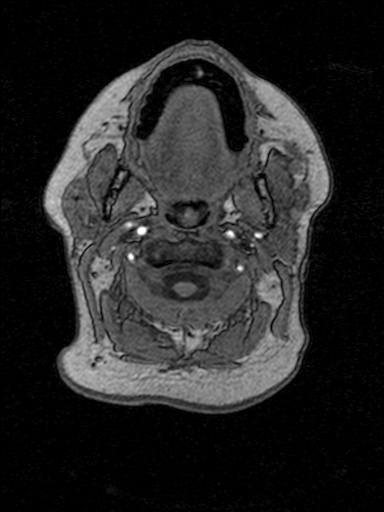
[im 33/176]
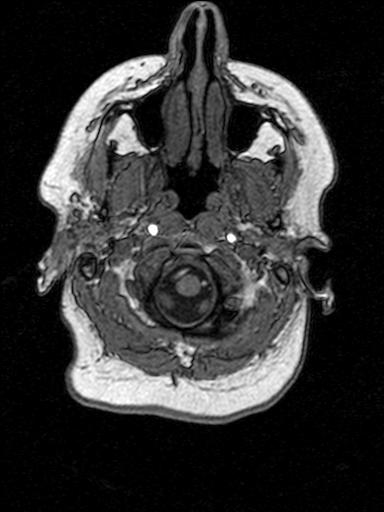
[im 55/176]
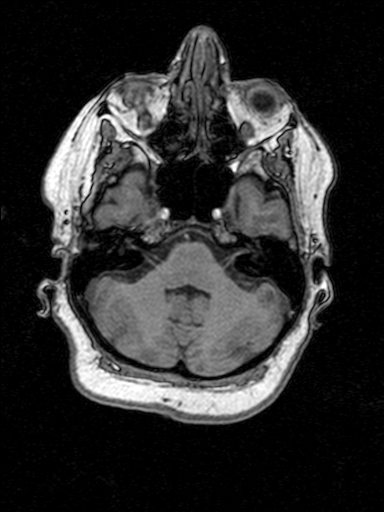
[im 77/176]
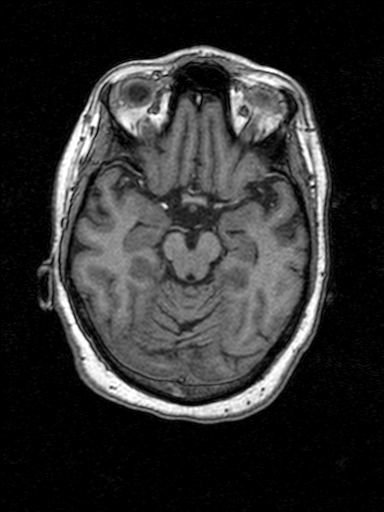
[im 99/176]
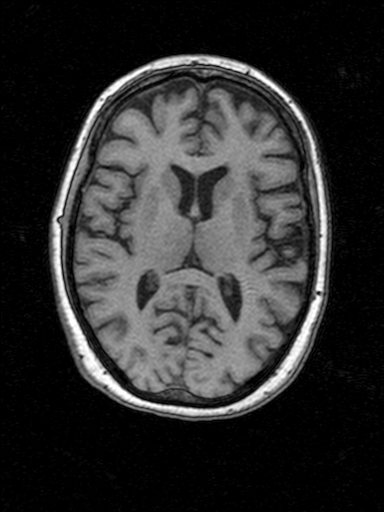
[im 121/176]
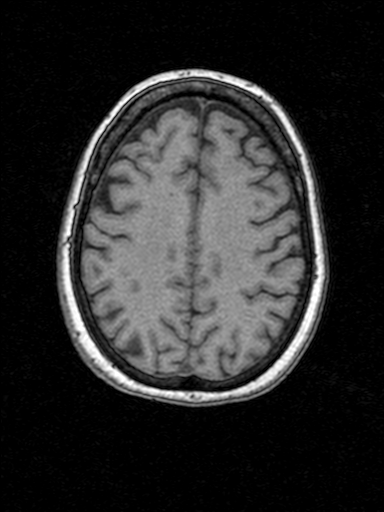
[im 143/176]
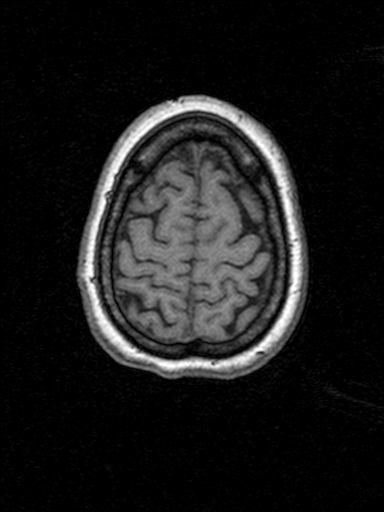
[im 165/176]
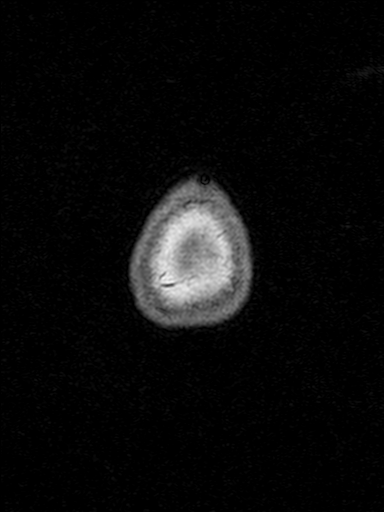

[Series 14: T2 · coronal · 5.0mm · 0.45mm/px · 3 of 28 slices shown (3 of 3)]
[im 1/28]
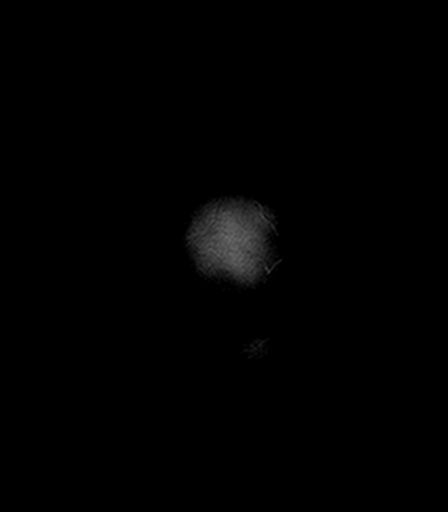
[im 14/28]
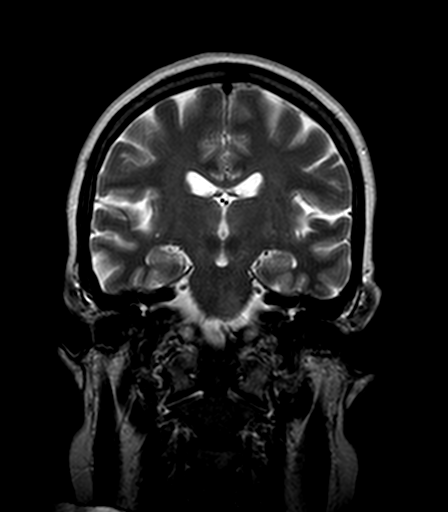
[im 28/28]
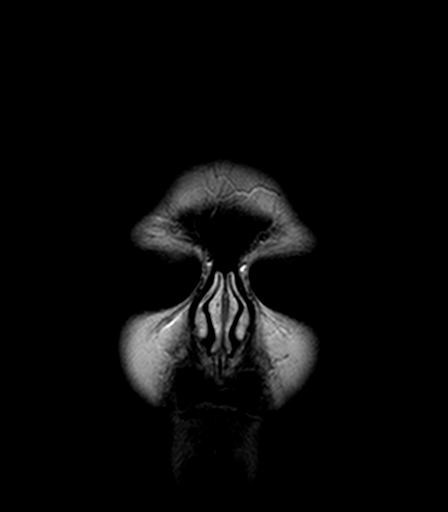

[40 of 48 positions shown; findings below may reference images not displayed]

FINDINGS: Brain: No restricted diffusion to suggest acute infarction. No
midline shift, mass effect, evidence of mass lesion,
ventriculomegaly, extra-axial collection or acute intracranial
hemorrhage. Cervicomedullary junction and pituitary are within
normal limits.

Gray and white matter signal is within normal limits throughout the
brain. No encephalomalacia or chronic cerebral blood products
identified.

Vascular: Major intracranial vascular flow voids are preserved.

Skull and upper cervical spine: Negative visible cervical spine.
Normal bone marrow signal.

Sinuses/Orbits: Normal orbits soft tissues. Paranasal sinuses and
mastoids are stable and well pneumatized.

Other: Visible internal auditory structures appear normal. Scalp and
face soft tissues appear negative.
IMPRESSION: Negative noncontrast MRI appearance of the brain.

## 2020-02-13 ENCOUNTER — Other Ambulatory Visit: Payer: Self-pay | Admitting: Neurology

## 2020-02-13 DIAGNOSIS — R569 Unspecified convulsions: Secondary | ICD-10-CM

## 2020-02-14 ENCOUNTER — Ambulatory Visit: Payer: Medicare Other | Admitting: Family Medicine

## 2020-02-14 ENCOUNTER — Ambulatory Visit: Payer: Self-pay

## 2020-02-14 NOTE — Telephone Encounter (Signed)
Pt. Called to report high blood sugars in 300's to 400's range.  Reported she checks Ketones in urine, but was unclear as to the Ketone range.  Had new patient appt. @ [facility], but had to cancel due to c/o cough, headache, and sinus congestion.  C/o decreased urine output, with increased concentration; described as "dark and smells like ammonia".  Also c/o low back pain.  Stated she is voiding in small amts. And only able to dribble urine.  Denied fever.  Had taken additional doses of Insulin x2 in 10 unit increments. Blood sugar remains @ 351 at 2:41 PM.  Advised due to no current PCP, would need to go to UC for eval.  Pt. And husband verb. Understanding.  Agreed to go to UC today.      Reason for Disposition . [1] Blood glucose > 240 mg/dL (22.9 mmol/L) AND [7] urine ketones moderate-large (or more than 1+)    Does not have a PCP at present time.  Has been checking blood sugar frequently; readings have ranged 300's -400's.  Unclear on what Ketone readings are from pt's response.  Advised UC eval.  Answer Assessment - Initial Assessment Questions 1. BLOOD GLUCOSE: "What is your blood glucose level?"      411 @ 6:00 AM; 356 @ 8:30 AM; 350 @ 2:30 PM ; 351 @ 2:41 PM 2. ONSET: "When did you check the blood glucose?"     See above  3. USUAL RANGE: "What is your glucose level usually?" (e.g., usual fasting morning value, usual evening value)     60's-70's 4. KETONES: "Do you check for ketones (urine or blood test strips)?" If yes, ask: "What does the test show now?"      Does test for ketones 5. TYPE 1 or 2:  "Do you know what type of diabetes you have?"  (e.g., Type 1, Type 2, Gestational; doesn't know)      Type 2 6. INSULIN: "Do you take insulin?" "What type of insulin(s) do you use? What is the mode of delivery? (syringe, pen; injection or pump)?"      Juanita Craver at 9:47 AM; since 4:00 AM, and took additional doses in 10 unit increments x 2, to try and lower her blood sugar.   7. DIABETES PILLS:  "Do you take any pills for your diabetes?" If yes, ask: "Have you missed taking any pills recently?"     *No Answer* 8. OTHER SYMPTOMS: "Do you have any symptoms?" (e.g., fever, frequent urination, difficulty breathing, dizziness, weakness, vomiting)     Feels like she has sinus infection, sore throat, headache, cough; denied fever; lower back pain, increased thirst; decreased urine output with concentrated urine  9. PREGNANCY: "Is there any chance you are pregnant?" "When was your last menstrual period?"     Hx of hysterectomy  Protocols used: DIABETES - HIGH BLOOD SUGAR-A-AH

## 2020-02-27 ENCOUNTER — Ambulatory Visit: Payer: Medicare Other

## 2020-03-01 ENCOUNTER — Other Ambulatory Visit: Payer: Self-pay

## 2020-03-01 ENCOUNTER — Encounter: Payer: Self-pay | Admitting: Family Medicine

## 2020-03-01 ENCOUNTER — Ambulatory Visit (INDEPENDENT_AMBULATORY_CARE_PROVIDER_SITE_OTHER): Payer: Medicare Other | Admitting: Family Medicine

## 2020-03-01 VITALS — BP 144/86 | HR 81 | Ht 64.0 in | Wt 177.0 lb

## 2020-03-01 DIAGNOSIS — E1140 Type 2 diabetes mellitus with diabetic neuropathy, unspecified: Secondary | ICD-10-CM

## 2020-03-01 DIAGNOSIS — Z7689 Persons encountering health services in other specified circumstances: Secondary | ICD-10-CM

## 2020-03-01 DIAGNOSIS — E66811 Obesity, class 1: Secondary | ICD-10-CM

## 2020-03-01 DIAGNOSIS — E781 Pure hyperglyceridemia: Secondary | ICD-10-CM | POA: Diagnosis not present

## 2020-03-01 DIAGNOSIS — N183 Chronic kidney disease, stage 3 unspecified: Secondary | ICD-10-CM

## 2020-03-01 DIAGNOSIS — N1831 Chronic kidney disease, stage 3a: Secondary | ICD-10-CM

## 2020-03-01 DIAGNOSIS — I129 Hypertensive chronic kidney disease with stage 1 through stage 4 chronic kidney disease, or unspecified chronic kidney disease: Secondary | ICD-10-CM | POA: Insufficient documentation

## 2020-03-01 DIAGNOSIS — R569 Unspecified convulsions: Secondary | ICD-10-CM

## 2020-03-01 DIAGNOSIS — F3160 Bipolar disorder, current episode mixed, unspecified: Secondary | ICD-10-CM

## 2020-03-01 DIAGNOSIS — I10 Essential (primary) hypertension: Secondary | ICD-10-CM | POA: Insufficient documentation

## 2020-03-01 DIAGNOSIS — E669 Obesity, unspecified: Secondary | ICD-10-CM | POA: Insufficient documentation

## 2020-03-01 DIAGNOSIS — M62838 Other muscle spasm: Secondary | ICD-10-CM

## 2020-03-01 DIAGNOSIS — K921 Melena: Secondary | ICD-10-CM

## 2020-03-01 DIAGNOSIS — K279 Peptic ulcer, site unspecified, unspecified as acute or chronic, without hemorrhage or perforation: Secondary | ICD-10-CM

## 2020-03-01 DIAGNOSIS — J4540 Moderate persistent asthma, uncomplicated: Secondary | ICD-10-CM

## 2020-03-01 MED ORDER — ALBUTEROL SULFATE HFA 108 (90 BASE) MCG/ACT IN AERS: 1.0000 | INHALATION_SPRAY | Freq: Four times a day (QID) | RESPIRATORY_TRACT | 3 refills | Status: DC | PRN

## 2020-03-01 MED ORDER — SUCRALFATE 1 G PO TABS: 1.0000 g | ORAL_TABLET | Freq: Three times a day (TID) | ORAL | 2 refills | Status: DC

## 2020-03-01 MED ORDER — CYCLOBENZAPRINE HCL 10 MG PO TABS: 10.0000 mg | ORAL_TABLET | Freq: Every day | ORAL | 5 refills | Status: DC | PRN

## 2020-03-01 MED ORDER — LOSARTAN POTASSIUM 50 MG PO TABS: 75.0000 mg | ORAL_TABLET | Freq: Every day | ORAL | 1 refills | Status: DC

## 2020-03-01 MED ORDER — METOPROLOL SUCCINATE ER 50 MG PO TB24: 75.0000 mg | ORAL_TABLET | Freq: Every day | ORAL | 1 refills | Status: AC

## 2020-03-01 MED ORDER — INSULIN PEN NEEDLE 31G X 8 MM MISC: 3 refills | Status: DC

## 2020-03-01 MED ORDER — OMEPRAZOLE 40 MG PO CPDR: 40.0000 mg | DELAYED_RELEASE_CAPSULE | Freq: Every day | ORAL | 2 refills | Status: DC

## 2020-03-01 MED ORDER — FLUTICASONE-SALMETEROL 250-50 MCG/DOSE IN AEPB: 1.0000 | INHALATION_SPRAY | Freq: Two times a day (BID) | RESPIRATORY_TRACT | 2 refills | Status: DC

## 2020-03-01 NOTE — Patient Instructions (Addendum)
Thank you for coming to the office today.  Referral to Dr [facility]  [facility] - [address], [address] [address] Hours: 8AM-5PM Phone: [phone]  HOLD Fenofibrate / Atorvastatin - until we get lab results   DUE for FASTING BLOOD WORK (no food or drink after midnight before the lab appointment, only water or coffee without cream/sugar on the morning of)  SCHEDULE "Lab Only" visit in the morning at the clinic for lab draw in 1 WEEK  - Make sure Lab Only appointment is at about 1 week before your next appointment, so that results will be available  For Lab Results, once available within 2-3 days of blood draw, you can can log in to MyChart online to view your results and a brief explanation. Also, we can discuss results at next follow-up visit.    Please schedule a Follow-up Appointment to: Return in about 3 days (around 03/04/2020) for fasting lab only on 1/24, follow-up 4-6 weeks DM, Specialists update.  If you have any other questions or concerns, please feel free to call the office or send a message through [place]. You may also schedule an earlier appointment if necessary.  Additionally, you may be receiving a survey about your experience at our office within a few days to 1 week by e-mail or mail. We value your feedback.  [name], DO [place]

## 2020-03-01 NOTE — Progress Notes (Signed)
Subjective:    Patient ID: Alejandra Frederick, female    DOB: 1970/11/09, 50 y.o.   MRN: [id number]  Alejandra Frederick is a 50 y.o. female presenting on 03/01/2020 for Establish Care, Diabetes, and Rectal Bleeding  Previously seen by Dr Rosario Jacks. Refused to go back to prior PCP.  Patient accompanied by husband, Arlin Savona during the entire visit today.  HPI   Type 2 Diabetes, controlled with Neuropathy Previously managed by [facility] Endocrinology Dr Honor Junes, then was seen by new PCP / Endo Dr Rosario Jacks, now here to establish, her husband has newly established with Dr Kelton Pillar through [facility] and she would like referral as well, she has been on Dexcom G6 glucometer with her insulin regimen and has had brittle diabetes with fluctuating readings, has history of "diabetic crisis" in past, with hyperglycemia history. She has 2 months left of Dexcom and will need future re orders.  GI Bleeding / Melena and Hematochezia PUD Had prior endoscopy dx with PUD years prior >10+ years ago, not regularly on PPI or other treatment since, was on Zantac in past. No longer on med. Describes bloody BMs mostly dark tarry stool, some clots passed. Denies rectal pain or bleeding. Requesting referral to GI Specialist  Moderate Persistent Asthma Tobacco Abuse / Smoker Albuterol usage daily, not on maintenance therapy. Smoking cessation interest in future.  CKD-III Followed by [facility] has upcoming apt  Generalized Edema Not currently active but has had some edema of lower extremity ankles and hands.  Hypertriglycerides Last lab >1000 has not started medication Has meds Fenofibrate, Atorvastatin but was holding these as advised before should not take both. Now CBGs under control, awaiting repeat lab check. Does not have recent resulted TG >1000, reported only.  History of Seizures Followed by Dr Greggory Keen Neurology Not cleared to drive   Bipolar Type 1, mixed Borderline  Personality Disorder Major Depression She is on mental health disability Followed by Dr Patriciaann Clan with [facility]  Fam history of Multiple myeloma    No flowsheet data found.  Social History   Tobacco Use  . Smoking status: Current Every Day Smoker    Packs/day: 0.25    Types: Cigarettes  . Smokeless tobacco: Never Used  Vaping Use  . Vaping Use: Never used  Substance Use Topics  . Alcohol use: No  . Drug use: Yes    Types: Cocaine    Comment: percocet and hx of alcohol abuse. denies any since age 51    Review of Systems Per HPI unless specifically indicated above     Objective:    BP (!) 144/86   Pulse 81   Ht '5\' 4"'  (1.626 m)   Wt 177 lb (80.3 kg)   LMP 09/11/2018 (Exact Date)   SpO2 100%   BMI 30.38 kg/m   Wt Readings from Last 3 Encounters:  03/01/20 177 lb (80.3 kg)  10/09/18 172 lb 8 oz (78.2 kg)  09/29/18 173 lb (78.5 kg)    Physical Exam Vitals and nursing note reviewed.  Constitutional:      General: She is not in acute distress.    Appearance: She is well-developed and well-nourished. She is not diaphoretic.     Comments: Well-appearing, comfortable, cooperative  HENT:     Head: Normocephalic and atraumatic.     Mouth/Throat:     Mouth: Oropharynx is clear and moist.  Eyes:     General:        Right eye: No discharge.  Left eye: No discharge.     Conjunctiva/sclera: Conjunctivae normal.  Cardiovascular:     Rate and Rhythm: Normal rate.  Pulmonary:     Effort: Pulmonary effort is normal. No respiratory distress.     Breath sounds: Normal breath sounds. No wheezing, rhonchi or rales.  Musculoskeletal:        General: No edema.  Skin:    General: Skin is warm and dry.     Findings: No erythema or rash.  Neurological:     Mental Status: She is alert and oriented to person, place, and time.  Psychiatric:        Mood and Affect: Mood and affect normal.        Behavior: Behavior normal.     Comments: Well groomed, good eye  contact, normal speech and thoughts        Results for orders placed or performed during the hospital encounter of 12/11/18  SARS CORONAVIRUS 2 (TAT 6-24 HRS) Nasopharyngeal Nasopharyngeal Swab   Specimen: Nasopharyngeal Swab  Result Value Ref Range   SARS Coronavirus 2 NEGATIVE NEGATIVE  Comprehensive metabolic panel  Result Value Ref Range   Sodium 134 (L) 135 - 145 mmol/L   Potassium 4.1 3.5 - 5.1 mmol/L   Chloride 100 98 - 111 mmol/L   CO2 20 (L) 22 - 32 mmol/L   Glucose, Bld 274 (H) 70 - 99 mg/dL   BUN 14 6 - 20 mg/dL   Creatinine, Ser 0.86 0.44 - 1.00 mg/dL   Calcium 9.6 8.9 - 10.3 mg/dL   Total Protein 7.8 6.5 - 8.1 g/dL   Albumin 4.5 3.5 - 5.0 g/dL   AST 28 15 - 41 U/L   ALT 29 0 - 44 U/L   Alkaline Phosphatase 76 38 - 126 U/L   Total Bilirubin 0.8 0.3 - 1.2 mg/dL   GFR calc non Af Amer >60 >60 mL/min   GFR calc Af Amer >60 >60 mL/min   Anion gap 14 5 - 15  Lactic acid, plasma  Result Value Ref Range   Lactic Acid, Venous 2.3 (HH) 0.5 - 1.9 mmol/L  Lactic acid, plasma  Result Value Ref Range   Lactic Acid, Venous 1.8 0.5 - 1.9 mmol/L  CBC with Differential  Result Value Ref Range   WBC 11.1 (H) 4.0 - 10.5 K/uL   RBC 4.40 3.87 - 5.11 MIL/uL   Hemoglobin 12.9 12.0 - 15.0 g/dL   HCT 38.6 36.0 - 46.0 %   MCV 87.7 80.0 - 100.0 fL   MCH 29.3 26.0 - 34.0 pg   MCHC 33.4 30.0 - 36.0 g/dL   RDW 13.2 11.5 - 15.5 %   Platelets 246 150 - 400 K/uL   nRBC 0.0 0.0 - 0.2 %   Neutrophils Relative % 70 %   Neutro Abs 7.7 1.7 - 7.7 K/uL   Lymphocytes Relative 24 %   Lymphs Abs 2.7 0.7 - 4.0 K/uL   Monocytes Relative 5 %   Monocytes Absolute 0.5 0.1 - 1.0 K/uL   Eosinophils Relative 1 %   Eosinophils Absolute 0.1 0.0 - 0.5 K/uL   Basophils Relative 0 %   Basophils Absolute 0.0 0.0 - 0.1 K/uL   Immature Granulocytes 0 %   Abs Immature Granulocytes 0.03 0.00 - 0.07 K/uL  Urinalysis, Complete w Microscopic  Result Value Ref Range   Color, Urine YELLOW (A) YELLOW    APPearance CLOUDY (A) CLEAR   Specific Gravity, Urine 1.028 1.005 - 1.030   pH  5.0 5.0 - 8.0   Glucose, UA >=500 (A) NEGATIVE mg/dL   Hgb urine dipstick SMALL (A) NEGATIVE   Bilirubin Urine NEGATIVE NEGATIVE   Ketones, ur 5 (A) NEGATIVE mg/dL   Protein, ur NEGATIVE NEGATIVE mg/dL   Nitrite NEGATIVE NEGATIVE   Leukocytes,Ua NEGATIVE NEGATIVE   RBC / HPF 0-5 0 - 5 RBC/hpf   WBC, UA 0-5 0 - 5 WBC/hpf   Bacteria, UA NONE SEEN NONE SEEN   Squamous Epithelial / LPF 11-20 0 - 5  Fibrin derivatives D-Dimer ([facility] only)  Result Value Ref Range   Fibrin derivatives D-dimer ([facility]) 214.53 0.00 - 499.00 ng/mL (FEU)  Troponin I (High Sensitivity)  Result Value Ref Range   Troponin I (High Sensitivity) 3 <18 ng/L  Troponin I (High Sensitivity)  Result Value Ref Range   Troponin I (High Sensitivity) 6 <18 ng/L      Assessment & Plan:   Problem List Items Addressed This Visit    Type 2 diabetes, controlled, with neuropathy (HCC) - Primary   Relevant Medications   TRESIBA FLEXTOUCH 200 UNIT/ML FlexTouch Pen   atorvastatin (LIPITOR) 40 MG tablet   Insulin Pen Needle 31G X 8 MM MISC   losartan (COZAAR) 50 MG tablet   Other Relevant Orders   Hemoglobin A1c   COMPLETE METABOLIC PANEL WITH GFR   Ambulatory referral to Endocrinology   Stage 3a chronic kidney disease (HCC)   Seizure-like activity (HCC)   PUD (peptic ulcer disease)   Relevant Medications   omeprazole (PRILOSEC) 40 MG capsule   sucralfate (CARAFATE) 1 g tablet   Other Relevant Orders   CBC with Differential/Platelet   Ambulatory referral to Gastroenterology   Obesity (BMI 30.0-34.9)   Moderate persistent asthma without complication   Relevant Medications   Fluticasone-Salmeterol (ADVAIR DISKUS) 250-50 MCG/DOSE AEPB   albuterol (VENTOLIN HFA) 108 (90 Base) MCG/ACT inhaler   Hypertriglyceridemia   Relevant Medications   atorvastatin (LIPITOR) 40 MG tablet   metoprolol succinate (TOPROL-XL) 50 MG 24 hr tablet   losartan  (COZAAR) 50 MG tablet   Other Relevant Orders   Lipid panel   Essential hypertension   Relevant Medications   atorvastatin (LIPITOR) 40 MG tablet   metoprolol succinate (TOPROL-XL) 50 MG 24 hr tablet   losartan (COZAAR) 50 MG tablet   Bipolar I disorder, most recent episode mixed ([place])    Other Visit Diagnoses    Encounter to establish care with new doctor       Black tarry stools       Relevant Medications   omeprazole (PRILOSEC) 40 MG capsule   sucralfate (CARAFATE) 1 g tablet   Other Relevant Orders   CBC with Differential/Platelet   Ambulatory referral to Gastroenterology   Muscle spasm of both lower legs       Relevant Medications   cyclobenzaprine (FLEXERIL) 10 MG tablet      #HTN Mild elevated Refill medications today as requested Metoprolol XL 40m (1.5 pill daily), Losartan 781m(1.5 pill) daily  #PUD ./ GERD  #Melena, black stools Concerning chronic history, prior EGD >10 year ago, no record No acute GI symptoms actively today, except bleeding history Start Omeprazole 4066maily for 1-3 months pending Add Carafate PRN Refer to [facility] Return criteria given if severe worse  #Bipolar Type 1, mixed / Depression Followed by [facility] Dr SpeFrederico Hamman medication management.  #Moderate, persistent asthma Uncontrolled Only on albuterol rescue inhaler PRN Advised start maintenance - Advair 1 puff BID, reduce albuterol  usage Smokes 4-5 cig dailyi, since 50 years old, approx 35+ years, quit for 8 years in past.  #Type 2 Diabetes Neuropathy, CKD Nephropathy Previously controlled based on reported A1c, need new A1c lab next week Has DexcomG6 currently for another 2 months will need re order Given labile blood sugars and complications, she request to see Endocrinology same as her husband, Dr Kelton Pillar, will place referral, advised her that I routinely do not order CGM successfully and therefore would benefit from seeing Endocrinology as  well.  #HyperTriglyceridemia Last reported >1000, do not have record of that result. Off Statin, Fenofibrate, will re-check fasting lipid next week, determine if can resume one or both.  Orders Placed This Encounter  Procedures  . Lipid panel    Standing Status:   Future    Standing Expiration Date:   04/09/2020    Order Specific Question:   Has the patient fasted?    Answer:   Yes  . Hemoglobin A1c    Standing Status:   Future    Standing Expiration Date:   04/09/2020  . COMPLETE METABOLIC PANEL WITH GFR    Standing Status:   Future    Standing Expiration Date:   04/09/2020  . CBC with Differential/Platelet    Standing Status:   Future    Standing Expiration Date:   05/30/2020  . Ambulatory referral to Endocrinology    Referral Priority:   Routine    Referral Type:   Consultation    Referral Reason:   Specialty Services Required    Number of Visits Requested:   1  . Ambulatory referral to Gastroenterology    Referral Priority:   Routine    Referral Type:   Consultation    Referral Reason:   Specialty Services Required    Number of Visits Requested:   1     Meds ordered this encounter  Medications  . Fluticasone-Salmeterol (ADVAIR DISKUS) 250-50 MCG/DOSE AEPB    Sig: Inhale 1 puff into the lungs 2 (two) times daily.    Dispense:  60 each    Refill:  2  . omeprazole (PRILOSEC) 40 MG capsule    Sig: Take 1 capsule (40 mg total) by mouth daily before breakfast.    Dispense:  30 capsule    Refill:  2  . sucralfate (CARAFATE) 1 g tablet    Sig: Take 1 tablet (1 g total) by mouth 4 (four) times daily -  with meals and at bedtime. As needed    Dispense:  30 tablet    Refill:  2  . Insulin Pen Needle 31G X 8 MM MISC    Sig: Inject insulin up to 4 times daily as directed    Dispense:  200 each    Refill:  3  . cyclobenzaprine (FLEXERIL) 10 MG tablet    Sig: Take 1 tablet (10 mg total) by mouth daily as needed for muscle spasms.    Dispense:  30 tablet    Refill:  5  .  metoprolol succinate (TOPROL-XL) 50 MG 24 hr tablet    Sig: Take 1.5 tablets (75 mg total) by mouth daily. Take with or immediately following a meal.    Dispense:  135 tablet    Refill:  1  . albuterol (VENTOLIN HFA) 108 (90 Base) MCG/ACT inhaler    Sig: Inhale 1-2 puffs into the lungs every 6 (six) hours as needed for wheezing or shortness of breath.    Dispense:  18 g    Refill:  3  . losartan (COZAAR) 50 MG tablet    Sig: Take 1.5 tablets (75 mg total) by mouth daily.    Dispense:  135 tablet    Refill:  1      Follow up plan: Return in about 3 days (around 03/04/2020) for fasting lab only on 1/24, follow-up 4-6 weeks DM, Specialists update.  Future labs ordered for CBC CMET Lipid A1c for 03/04/20  [name], DO [facility] 03/01/2020, 2:57 PM

## 2020-03-05 ENCOUNTER — Other Ambulatory Visit: Payer: Medicare Other

## 2020-03-08 ENCOUNTER — Other Ambulatory Visit: Payer: Self-pay | Admitting: Nephrology

## 2020-03-08 DIAGNOSIS — I10 Essential (primary) hypertension: Secondary | ICD-10-CM

## 2020-03-08 DIAGNOSIS — R82998 Other abnormal findings in urine: Secondary | ICD-10-CM

## 2020-03-08 DIAGNOSIS — N1831 Chronic kidney disease, stage 3a: Secondary | ICD-10-CM

## 2020-03-08 DIAGNOSIS — E1122 Type 2 diabetes mellitus with diabetic chronic kidney disease: Secondary | ICD-10-CM

## 2020-03-14 ENCOUNTER — Ambulatory Visit: Payer: Medicare Other | Admitting: Internal Medicine

## 2020-03-14 NOTE — Progress Notes (Deleted)
Name: Alejandra Frederick  MRN/ DOB: [id number], 08-26-1970   Age/ Sex: 50 y.o., female    PCP: Tracie Harrier, MD   Reason for Endocrinology Evaluation: Type 2 Diabetes Mellitus     Date of Initial Endocrinology Visit: 03/14/2020     PATIENT IDENTIFIER: Alejandra Frederick is a 50 y.o. female with a past medical history of T2DM, Bipolar disorder, HTN , and Dyslipidemia. The patient presented for initial endocrinology clinic visit on 03/14/2020 for consultative assistance with her diabetes management.    HPI: [name] was    Diagnosed with DM in2018 Prior Medications tried/Intolerance: *** Currently checking blood sugars *** x / day,  before breakfast and ***.  Hypoglycemia episodes : ***               Symptoms: ***                 Frequency: ***/  Hemoglobin A1c has ranged from 7.5% in 2018, peaking at 7.6% in 2020. Patient required assistance for hypoglycemia:  Patient has required hospitalization within the last 1 year from hyper or hypoglycemia:   In terms of diet, the patient ***   HOME DIABETES REGIMEN: Basaglar  Novolog     Statin: yes ACE-I/ARB: yes    METER DOWNLOAD SUMMARY: Date range evaluated: *** Fingerstick Blood Glucose Tests = *** Average Number Tests/Day = *** Overall Mean FS Glucose = *** Standard Deviation = ***  BG Ranges: Low = *** High = ***   Hypoglycemic Events/30 Days: BG < 50 = *** Episodes of symptomatic severe hypoglycemia = ***   DIABETIC COMPLICATIONS: Microvascular complications:   ***  Denies: CKD  Last eye exam: Completed   Macrovascular complications:    Denies: CAD, PVD, CVA   PAST HISTORY: Past Medical History:  Past Medical History:  Diagnosis Date  . Anxiety   . Asthma   . Cervical cancer [facility]) 2007   surgical resection to freeze cells.   . Complication of anesthesia    hard to wake up   . Depression   . Gastroparesis   . Headache   . High cholesterol   . Hypertension   . Myocardial infarction ([place])  2016  . Pulmonary embolus ([place])   . Stroke [facility]) 2013    Past Surgical History:  Past Surgical History:  Procedure Laterality Date  . CYSTOCELE REPAIR N/A 10/03/2018   Procedure: Anterior Colporrhaphy and Culdoplasty;  Surgeon: Ward, Honor Loh, MD;  Location: [facility] ORS;  Service: Gynecology;  Laterality: N/A;  . LAPAROSCOPIC HYSTERECTOMY N/A 10/03/2018   Procedure: HYSTERECTOMY TOTAL LAPAROSCOPIC, bilateral Salpingectomy;  Surgeon: Ward, Honor Loh, MD;  Location: [facility] ORS;  Service: Gynecology;  Laterality: N/A;  . TONSILLECTOMY    . TUBAL LIGATION        Social History:  reports that she has been smoking cigarettes. She has been smoking about 0.25 packs per day. She has never used smokeless tobacco. She reports current drug use. Drug: Cocaine. She reports that she does not drink alcohol. Family History:  Family History  Problem Relation Age of Onset  . Multiple myeloma Mother 45  . Bipolar disorder Mother   . Diabetes Mother   . Heart disease Father   . Bipolar disorder Father   . Multiple myeloma Sister 15  . Bipolar disorder Sister   . Prostate cancer Paternal Grandfather       HOME MEDICATIONS: Allergies as of 03/14/2020      Reactions   Bee Venom Anaphylaxis   Ciprofloxacin  Anaphylaxis   Zofran [ondansetron Hcl] Anaphylaxis   Swelling of the throat and redness of the face   Lactose Intolerance (gi) Diarrhea   Upset stomach      Medication List       Accurate as of March 14, 2020  7:10 AM. If you have any questions, ask your nurse or doctor.        albuterol 108 (90 Base) MCG/ACT inhaler Commonly known as: VENTOLIN HFA Inhale 1-2 puffs into the lungs every 6 (six) hours as needed for wheezing or shortness of breath.   ANTI-ITCH EXTRA STRENGTH EX Apply 1 application topically 4 (four) times daily as needed (itching.).   aspirin EC 81 MG tablet Take 81 mg by mouth at bedtime.   atorvastatin 40 MG tablet Commonly known as: LIPITOR Take 40 mg by mouth  daily.   BASAGLAR KWIKPEN Needles Inject 40 Units into the skin.   busPIRone 7.5 MG tablet Commonly known as: BUSPAR 2 (two) times daily   clonazePAM 2 MG tablet Commonly known as: KLONOPIN Take 1-2 mg by mouth See admin instructions. Take 1 tablet (2 mg) by mouth in the morning, 1 tablet (2 mg) by mouth in the afternoon, 0.5 tablet (1 mg) by mouth at bedtime.   cloNIDine 0.2 MG tablet Commonly known as: CATAPRES Take 0.2 mg by mouth at bedtime.   cyclobenzaprine 10 MG tablet Commonly known as: FLEXERIL Take 1 tablet (10 mg total) by mouth daily as needed for muscle spasms.   Dexcom G6 Sensor Misc Use to monitor blood sugar.  Replace every 10 days   Dexcom G6 Transmitter Misc Use to monitor blood sugar.  Replace every 3 months   Dexcom G6 Transmitter Misc Use to monitor blood sugar.  Replace every 10 days   Emgality 120 MG/ML Soaj Generic drug: Galcanezumab-gnlm Inject into the skin.   fenofibrate 48 MG tablet Commonly known as: TRICOR Take by mouth.   FLUoxetine 20 MG capsule Commonly known as: PROZAC Take by mouth.   Fluticasone-Salmeterol 250-50 MCG/DOSE Aepb Commonly known as: Advair Diskus Inhale 1 puff into the lungs 2 (two) times daily.   ibuprofen 200 MG tablet Commonly known as: ADVIL Take 600-800 mg by mouth 2 (two) times daily as needed (pain.).   ibuprofen 800 MG tablet Commonly known as: ADVIL Take 1 tablet (800 mg total) by mouth every 6 (six) hours.   insulin aspart 100 UNIT/ML injection Commonly known as: novoLOG Inject 2-10 Units into the skin 3 (three) times daily. Sliding Scale insulin   Insulin Pen Needle 31G X 8 MM Misc Inject insulin up to 4 times daily as directed   ketorolac 30 MG/ML injection Commonly known as: TORADOL Inject 30 mg into the muscle daily as needed (migraine headaches.).   losartan 50 MG tablet Commonly known as: COZAAR Take 1.5 tablets (75 mg total) by mouth daily.   methylphenidate 10 MG tablet Commonly known  as: RITALIN Take 30 mg by mouth 2 (two) times daily.   metoprolol succinate 50 MG 24 hr tablet Commonly known as: TOPROL-XL Take 1.5 tablets (75 mg total) by mouth daily. Take with or immediately following a meal.   morphine 15 MG tablet Commonly known as: MSIR Take 1 tablet (15 mg total) by mouth every 4 (four) hours as needed for severe pain.   nortriptyline 10 MG capsule Commonly known as: PAMELOR Take 40 mg by mouth at bedtime.   omeprazole 40 MG capsule Commonly known as: PRILOSEC Take 1 capsule (40 mg total) by  mouth daily before breakfast.   prazosin 5 MG capsule Commonly known as: MINIPRESS Take 10 mg by mouth at bedtime.   sucralfate 1 g tablet Commonly known as: Carafate Take 1 tablet (1 g total) by mouth 4 (four) times daily -  with meals and at bedtime. As needed   Tresiba FlexTouch 200 UNIT/ML FlexTouch Pen Generic drug: insulin degludec SMARTSIG:26 Unit(s) SUB-Q Daily   Visine-A 0.025-0.3 % ophthalmic solution Generic drug: naphazoline-pheniramine Place 1 drop into both eyes 4 (four) times daily as needed for eye irritation.        ALLERGIES: Allergies  Allergen Reactions  . Bee Venom Anaphylaxis  . Ciprofloxacin Anaphylaxis  . Zofran [Ondansetron Hcl] Anaphylaxis    Swelling of the throat and redness of the face   . Lactose Intolerance (Gi) Diarrhea    Upset stomach     REVIEW OF SYSTEMS: A comprehensive ROS was conducted with the patient and is negative except as per HPI and below:  ROS    OBJECTIVE:   VITAL SIGNS: LMP 09/11/2018 (Exact Date)    PHYSICAL EXAM:  General: Pt appears well and is in NAD  Hydration: Well-hydrated with moist mucous membranes and good skin turgor  HEENT: Head: Unremarkable with good dentition. Oropharynx clear without exudate.  Eyes: External eye exam normal without stare, lid lag or exophthalmos.  EOM intact.  PERRL.  Neck: General: Supple without adenopathy or carotid bruits. Thyroid: Thyroid size normal.   No goiter or nodules appreciated. No thyroid bruit.  Lungs: Clear with good BS bilat with no rales, rhonchi, or wheezes  Heart: RRR with normal S1 and S2 and no gallops; no murmurs; no rub  Abdomen: Normoactive bowel sounds, soft, nontender, without masses or organomegaly palpable  Extremities:  Lower extremities - No pretibial edema. No lesions.  Skin: Normal texture and temperature to palpation. No rash noted. No Acanthosis nigricans/skin tags. No lipohypertrophy.  Neuro: MS is good with appropriate affect, pt is alert and Ox3    DM foot exam:    DATA REVIEWED:  Lab Results  Component Value Date   HGBA1C 7.6 (H) 10/03/2018   HGBA1C 7.5 (H) 08/23/2016   Lab Results  Component Value Date   CREATININE 0.86 12/11/2018   No results found for: MICRALBCREAT  Lab Results  Component Value Date   TRIG 551 (H) 08/22/2016        ASSESSMENT / PLAN / RECOMMENDATIONS:   1) Type 2 Diabetes Mellitus, Sub-optimally controlled, With*** complications - Most recent A1c of *** %. Goal A1c < 7.0 %.    Plan: GENERAL:  ***  MEDICATIONS:  ***  EDUCATION / INSTRUCTIONS:  BG monitoring instructions: Patient is instructed to check her blood sugars *** times a day, ***.  Call [facility] if: BG persistently < 70 or > 300. . I reviewed the Rule of 15 for the treatment of hypoglycemia in detail with the patient. Literature supplied.   2) Diabetic complications:   Eye: Does *** have known diabetic retinopathy.   Neuro/ Feet: Does *** have known diabetic peripheral neuropathy.  Renal: Patient does *** have known baseline CKD. She is *** on an ACEI/ARB at present.  3) Hypertriglyceridemia:   - Tg as high as 551 mg/dL in 2018.   - Patient is on Atorvastatin 40 mg daily         Signed electronically by: Mack Guise, MD  [facility]  [facility] [address], [place], [place] [address] Phone:  [place]:  [phone]   CC: Tracie Harrier, [place] [address] Phone: [phone]  Fax: [phone]    Return to Endocrinology clinic as below: Future Appointments  Date Time Provider [place]  03/14/2020 11:30 AM Dujuan Stankowski, Melanie Crazier, MD [facility] None  03/20/2020  2:30 PM [facility]

## 2020-03-20 ENCOUNTER — Ambulatory Visit
Admission: RE | Admit: 2020-03-20 | Discharge: 2020-03-20 | Disposition: A | Discharge status: Home / Self Care | Payer: Medicare Other | Source: Ambulatory Visit | Attending: Nephrology | Admitting: Nephrology

## 2020-03-20 ENCOUNTER — Other Ambulatory Visit: Payer: Self-pay

## 2020-03-20 DIAGNOSIS — E1122 Type 2 diabetes mellitus with diabetic chronic kidney disease: Secondary | ICD-10-CM | POA: Insufficient documentation

## 2020-03-20 DIAGNOSIS — R82998 Other abnormal findings in urine: Secondary | ICD-10-CM

## 2020-03-20 DIAGNOSIS — I10 Essential (primary) hypertension: Secondary | ICD-10-CM

## 2020-03-20 DIAGNOSIS — N1831 Chronic kidney disease, stage 3a: Secondary | ICD-10-CM | POA: Diagnosis present

## 2020-03-27 ENCOUNTER — Other Ambulatory Visit: Payer: Self-pay | Admitting: Family Medicine

## 2020-03-27 DIAGNOSIS — K921 Melena: Secondary | ICD-10-CM

## 2020-03-27 DIAGNOSIS — K279 Peptic ulcer, site unspecified, unspecified as acute or chronic, without hemorrhage or perforation: Secondary | ICD-10-CM

## 2020-04-04 ENCOUNTER — Encounter: Payer: Self-pay | Admitting: Internal Medicine

## 2020-04-04 ENCOUNTER — Telehealth: Payer: Self-pay | Admitting: Internal Medicine

## 2020-04-04 ENCOUNTER — Other Ambulatory Visit: Payer: Self-pay

## 2020-04-04 ENCOUNTER — Ambulatory Visit (INDEPENDENT_AMBULATORY_CARE_PROVIDER_SITE_OTHER): Payer: Medicare Other | Admitting: Internal Medicine

## 2020-04-04 VITALS — BP 136/88 | HR 69 | Ht 64.0 in | Wt 184.2 lb

## 2020-04-04 DIAGNOSIS — E1122 Type 2 diabetes mellitus with diabetic chronic kidney disease: Secondary | ICD-10-CM | POA: Diagnosis not present

## 2020-04-04 DIAGNOSIS — Z794 Long term (current) use of insulin: Secondary | ICD-10-CM

## 2020-04-04 DIAGNOSIS — N1831 Chronic kidney disease, stage 3a: Secondary | ICD-10-CM | POA: Insufficient documentation

## 2020-04-04 DIAGNOSIS — E781 Pure hyperglyceridemia: Secondary | ICD-10-CM

## 2020-04-04 DIAGNOSIS — E1165 Type 2 diabetes mellitus with hyperglycemia: Secondary | ICD-10-CM | POA: Diagnosis not present

## 2020-04-04 DIAGNOSIS — E0822 Diabetes mellitus due to underlying condition with diabetic chronic kidney disease: Secondary | ICD-10-CM | POA: Insufficient documentation

## 2020-04-04 DIAGNOSIS — N183 Chronic kidney disease, stage 3 unspecified: Secondary | ICD-10-CM

## 2020-04-04 LAB — POCT GLYCOSYLATED HEMOGLOBIN (HGB A1C): Hemoglobin A1C: 8.5 % — AB (ref 4.0–5.6)

## 2020-04-04 MED ORDER — DAPAGLIFLOZIN PROPANEDIOL 5 MG PO TABS: 5.0000 mg | ORAL_TABLET | Freq: Every day | ORAL | 6 refills | Status: DC

## 2020-04-04 MED ORDER — TRESIBA FLEXTOUCH 200 UNIT/ML ~~LOC~~ SOPN: 28.0000 [IU] | PEN_INJECTOR | Freq: Every day | SUBCUTANEOUS | 3 refills | Status: DC

## 2020-04-04 MED ORDER — NOVOLOG FLEXPEN 100 UNIT/ML ~~LOC~~ SOPN: PEN_INJECTOR | SUBCUTANEOUS | 6 refills | Status: DC

## 2020-04-04 MED ORDER — INSULIN PEN NEEDLE 31G X 8 MM MISC: 1.0000 | Freq: Four times a day (QID) | 3 refills | Status: DC

## 2020-04-04 MED ORDER — DEXCOM G6 SENSOR MISC: 1.0000 | 3 refills | Status: DC

## 2020-04-04 MED ORDER — ONETOUCH DELICA PLUS LANCET33G MISC: 1.0000 | 1 refills | Status: AC

## 2020-04-04 MED ORDER — DEXCOM G6 TRANSMITTER MISC: 1.0000 | 3 refills | Status: DC

## 2020-04-04 MED ORDER — ONETOUCH VERIO VI STRP: 1.0000 | ORAL_STRIP | Freq: Four times a day (QID) | 3 refills | Status: DC

## 2020-04-04 NOTE — Progress Notes (Addendum)
Name: Alejandra Frederick  MRN/ DOB: [id number], 18-Feb-1970   Age/ Sex: 50 y.o., female    PCP: Olin Hauser, DO   Reason for Endocrinology Evaluation: Type 2 Diabetes Mellitus     Date of Initial Endocrinology Visit: 04/04/2020     PATIENT IDENTIFIER: Alejandra Frederick is a 50 y.o. female with a past medical history of T2DM, Bipolar disorder, HTN , and Dyslipidemia. The patient presented for initial endocrinology clinic visit on 04/04/2020 for consultative assistance with her diabetes management.    HPI: Alejandra Frederick is accompanied by her husband today   Diagnosed with DM  At age 5 , started as gestational diabetes  Prior Medications tried/Intolerance: Metformin- GI side effects. Ozempic/Trulicity. Jardiance won't  get covered.  Currently checking blood sugars multiple  x / day, through CGM  Hypoglycemia episodes : yes             Symptoms: yes                Frequency: a few times a week  Hemoglobin A1c has ranged from 7.5% in 2018, peaking at 7.6% in 2020. Patient required assistance for hypoglycemia:  Patient has required hospitalization within the last 1 year from hyper or hypoglycemia: no   In terms of diet, the patient eats 1 meals a day, snacks for lunch . Avoids sugar-sweetened beverages   [facility] - nephrology     HOME DIABETES REGIMEN: Tresiba 28 units daily QAM  Novolog 3 units with small meal and 6 units with large meal   150-200= 2 units 201- 250- 4 units  215- 300 - 6 units     Statin: yes ACE-I/ARB: yes     CONTINUOUS GLUCOSE MONITORING RECORD INTERPRETATION    Dates of Recording: 2/11-2/24/2022  Sensor description:Dexcom  Results statistics:   CGM use % of time 93  Average and SD 207/49  Time in range   30     %  % Time Above 180 52  % Time above 250 17  % Time Below target <1    Glycemic patterns summary: hyperglycemia all day and night   Hyperglycemic episodes  Mostly postprandial   Hypoglycemic episodes occurred  after a bolus   Overnight periods: high        DIABETIC COMPLICATIONS: Microvascular complications:   CKD III  Denies: retinopathy, neuropathy   Last eye exam: Completed 2021  Macrovascular complications:    Denies: CAD, PVD, CVA   PAST HISTORY: Past Medical History:  Past Medical History:  Diagnosis Date  . Anxiety   . Asthma   . Cervical cancer [facility]) 2007   surgical resection to freeze cells.   . Complication of anesthesia    hard to wake up   . Depression   . Gastroparesis   . Headache   . High cholesterol   . Hypertension   . Myocardial infarction ([place]) 2016  . Pulmonary embolus ([place])   . Stroke [facility]) 2013   Past Surgical History:  Past Surgical History:  Procedure Laterality Date  . CYSTOCELE REPAIR N/A 10/03/2018   Procedure: Anterior Colporrhaphy and Culdoplasty;  Surgeon: Ward, Honor Loh, MD;  Location: [facility] ORS;  Service: Gynecology;  Laterality: N/A;  . LAPAROSCOPIC HYSTERECTOMY N/A 10/03/2018   Procedure: HYSTERECTOMY TOTAL LAPAROSCOPIC, bilateral Salpingectomy;  Surgeon: Ward, Honor Loh, MD;  Location: [facility] ORS;  Service: Gynecology;  Laterality: N/A;  . TONSILLECTOMY    . TUBAL LIGATION        Social History:  reports  that she has been smoking cigarettes. She has been smoking about 0.25 packs per day. She has never used smokeless tobacco. She reports current drug use. Drug: Cocaine. She reports that she does not drink alcohol. Family History:  Family History  Problem Relation Age of Onset  . Multiple myeloma Mother 31  . Bipolar disorder Mother   . Diabetes Mother   . Heart disease Father   . Bipolar disorder Father   . Multiple myeloma Sister 68  . Bipolar disorder Sister   . Prostate cancer Paternal Grandfather      HOME MEDICATIONS: Allergies as of 04/04/2020      Reactions   Bee Venom Anaphylaxis   Ciprofloxacin Anaphylaxis   Zofran [ondansetron Hcl] Anaphylaxis   Swelling of the throat and redness of the face   Lactose  Intolerance (gi) Diarrhea   Upset stomach      Medication List       Accurate as of April 04, 2020 11:46 AM. If you have any questions, ask your nurse or doctor.        STOP taking these medications   BASAGLAR KWIKPEN [place]ard Stopped by: Dorita Sciara, MD     TAKE these medications   albuterol 108 (90 Base) MCG/ACT inhaler Commonly known as: VENTOLIN HFA Inhale 1-2 puffs into the lungs every 6 (six) hours as needed for wheezing or shortness of breath.   ANTI-ITCH EXTRA STRENGTH EX Apply 1 application topically 4 (four) times daily as needed (itching.).   aspirin EC 81 MG tablet Take 81 mg by mouth at bedtime.   atorvastatin 40 MG tablet Commonly known as: LIPITOR Take 40 mg by mouth daily.   busPIRone 7.5 MG tablet Commonly known as: BUSPAR 2 (two) times daily   clonazePAM 2 MG tablet Commonly known as: KLONOPIN Take 1-2 mg by mouth See admin instructions. Take 1 tablet (2 mg) by mouth in the morning, 1 tablet (2 mg) by mouth in the afternoon, 0.5 tablet (1 mg) by mouth at bedtime.   cloNIDine 0.2 MG tablet Commonly known as: CATAPRES Take 0.2 mg by mouth at bedtime.   cyclobenzaprine 10 MG tablet Commonly known as: FLEXERIL Take 1 tablet (10 mg total) by mouth daily as needed for muscle spasms.   Dexcom G6 Sensor Misc Use to monitor blood sugar.  Replace every 10 days   Dexcom G6 Transmitter Misc Use to monitor blood sugar.  Replace every 3 months   Dexcom G6 Transmitter Misc Use to monitor blood sugar.  Replace every 10 days   Emgality 120 MG/ML Soaj Generic drug: Galcanezumab-gnlm Inject into the skin.   fenofibrate 48 MG tablet Commonly known as: TRICOR Take by mouth.   FLUoxetine 20 MG capsule Commonly known as: PROZAC Take by mouth.   Fluticasone-Salmeterol 250-50 MCG/DOSE Aepb Commonly known as: Advair Diskus Inhale 1 puff into the lungs 2 (two) times daily.   ibuprofen 200 MG tablet Commonly known as: ADVIL Take 600-800 mg by  mouth 2 (two) times daily as needed (pain.).   ibuprofen 800 MG tablet Commonly known as: ADVIL Take 1 tablet (800 mg total) by mouth every 6 (six) hours.   insulin aspart 100 UNIT/ML injection Commonly known as: novoLOG Inject 2-10 Units into the skin 3 (three) times daily. Sliding Scale insulin   Insulin Pen Needle 31G X 8 MM Misc Inject insulin up to 4 times daily as directed   ketorolac 30 MG/ML injection Commonly known as: TORADOL Inject 30 mg into the muscle daily as  needed (migraine headaches.).   losartan 50 MG tablet Commonly known as: COZAAR Take 1.5 tablets (75 mg total) by mouth daily.   methylphenidate 10 MG tablet Commonly known as: RITALIN Take 30 mg by mouth 2 (two) times daily.   metoprolol succinate 50 MG 24 hr tablet Commonly known as: TOPROL-XL Take 1.5 tablets (75 mg total) by mouth daily. Take with or immediately following a meal.   morphine 15 MG tablet Commonly known as: MSIR Take 1 tablet (15 mg total) by mouth every 4 (four) hours as needed for severe pain.   nortriptyline 10 MG capsule Commonly known as: PAMELOR Take 40 mg by mouth at bedtime.   omeprazole 40 MG capsule Commonly known as: PRILOSEC Take 1 capsule (40 mg total) by mouth daily before breakfast.   prazosin 5 MG capsule Commonly known as: MINIPRESS Take 10 mg by mouth at bedtime.   sucralfate 1 g tablet Commonly known as: CARAFATE TAKE 1 TABLET BY MOUTH FOUR TIMES DAILY(WITH MEALS AND AT BEDTIME) AS NEEDED   Tresiba FlexTouch 200 UNIT/ML FlexTouch Pen Generic drug: insulin degludec SMARTSIG:26 Unit(s) SUB-Q Daily   Visine-A 0.025-0.3 % ophthalmic solution Generic drug: naphazoline-pheniramine Place 1 drop into both eyes 4 (four) times daily as needed for eye irritation.        ALLERGIES: Allergies  Allergen Reactions  . Bee Venom Anaphylaxis  . Ciprofloxacin Anaphylaxis  . Zofran [Ondansetron Hcl] Anaphylaxis    Swelling of the throat and redness of the face    . Lactose Intolerance (Gi) Diarrhea    Upset stomach     REVIEW OF SYSTEMS: A comprehensive ROS was conducted with the patient and is negative except as per HPI and below:  Review of Systems  Gastrointestinal: Positive for constipation and nausea.      OBJECTIVE:   VITAL SIGNS: BP 136/88   Pulse 69   Ht '5\' 4"'  (1.626 m)   Wt 184 lb 4 oz (83.6 kg)   LMP 09/11/2018 (Exact Date)   SpO2 99%   BMI 31.63 kg/m    PHYSICAL EXAM:  General: Pt appears well and is in NAD  Neck: General: Supple without adenopathy or carotid bruits. Thyroid: Thyroid size normal.  No goiter or nodules appreciated.  Lungs: Clear with good BS bilat with no rales, rhonchi, or wheezes  Heart: RRR with normal S1 and S2 and no gallops; no murmurs; no rub  Abdomen: Normoactive bowel sounds, soft, nontender, without masses or organomegaly palpable  Extremities:  Lower extremities - No pretibial edema. No lesions.  Skin: Normal texture and temperature to palpation. No rash noted. No Acanthosis nigricans/skin tags. No lipohypertrophy.  Neuro: MS is good with appropriate affect, pt is alert and Ox3    DM foot exam: 04/04/2020  The skin of the feet is intact without sores or ulcerations. The pedal pulses are 2+ on right and 2+ on left. The sensation is intact to a screening 5.07, 10 gram monofilament bilaterally   DATA REVIEWED:  Lab Results  Component Value Date   HGBA1C 8.5 (A) 04/04/2020   HGBA1C 7.6 (H) 10/03/2018   HGBA1C 7.5 (H) 08/23/2016   Lab Results  Component Value Date   CREATININE 0.86 12/11/2018     Lab Results  Component Value Date   TRIG 551 (H) 08/22/2016        ASSESSMENT / PLAN / RECOMMENDATIONS:   1) Type 2 Diabetes Mellitus, Poorly  controlled, With CKD III complications - Most recent A1c of 8.5 %. Goal A1c <  7.0 %.    Plan: GENERAL: I have discussed with the patient the pathophysiology of diabetes. We went over the natural progression of the disease. We talked about  both insulin resistance and insulin deficiency. We stressed the importance of lifestyle changes including diet and exercise. I explained the complications associated with diabetes including retinopathy, nephropathy, neuropathy as well as increased risk of cardiovascular disease. We went over the benefit seen with glycemic control.  Discussed pharmacokinetics of basal/bolus insulin and the importance of taking prandial insulin with meals.   We also discussed avoiding sugar-sweetened beverages and snacks, when possible.   She is intolerant to metformin  She has tried Ozempic and Trulicity with persistent hyperglycemia  Alejandra Frederick has severe fear of hypoglycemia hence the persistent hyperglycemia.  We are going to stop standing dose of prandial insulin and give her a new correction scale to use  We discussed add on therapy with SGL T-2 inhibitors, cautioned against genital infections  I have advised her not to increase [place] anymore  Medications have been refilled, and prescription for Dexcom will be faxed to [facility].    MEDICATIONS:  Continue Tresiba 28 units daily  Start Farxiga 5 mg, 1 tablet daily in the mornings  CF: NovoLog (BG -140/30) starting at 200  EDUCATION / INSTRUCTIONS:  BG monitoring instructions: Patient is instructed to check her blood sugars 3 times a day, before meals.  Call [facility] if: BG persistently < 70  . I reviewed the Rule of 15 for the treatment of hypoglycemia in detail with the patient. Literature supplied.   2) Diabetic complications:   Eye: Does not have known diabetic retinopathy.   Neuro/ Feet: Does not have known diabetic peripheral neuropathy.  Renal: Patient does have known baseline CKD. She is on an ACEI/ARB at present.  She follows with [facility] nephrology.  Patient her GFR is above 35, ~in the 50s  3) Hypertriglyceridemia:   - Tg as high as 551 mg/dL in 2018.  Per patient she had recent lab work  through nephrology, these records are not available at this time.  Patient tells me there will be a discussion about starting fenofibrate versus continuing with atorvastatin through her nephrologist. -I have advised the patient that she must continue on statin therapy due to their cardiovascular benefits that has not been reported with fenofibrate, fenofibrate can be added as add add on therapy to statins but not as a replacement.  - Patient is on Atorvastatin 40 mg daily      Follow-up in 3 months   Signed electronically by: Mack Guise, MD  [facility]  [facility] [address] [place], [place] [address] Phone: [phone] FAX: [phone]   CC: Olin Hauser, DO [place] [address] Phone: [phone]  Fax: [phone]    Return to Endocrinology clinic as below: No future appointments.

## 2020-04-04 NOTE — Patient Instructions (Signed)
-   Continue Tresiba 28 units daily  - Start Farxiga 5 mg, 1 tablet in the morning  - Novolog correctional insulin:  Use the scale below to help guide you:   Blood sugar before meal Number of units to inject  Less than 200 0 unit  201 -  230 2 units  231 -  260 3 units  261 -  290 4 units  291 -  320 5 units  321 -  350 6 units  351 -  380 7 units  381 -  410 8 units    HOW TO TREAT LOW BLOOD SUGARS (Blood sugar LESS THAN 70 MG/DL)  Please follow the RULE OF 15 for the treatment of hypoglycemia treatment (when your (blood sugars are less than 70 mg/dL)    STEP 1: Take 15 grams of carbohydrates when your blood sugar is low, which includes:   3-4 GLUCOSE TABS  OR  3-4 OZ OF JUICE OR REGULAR SODA OR  ONE TUBE OF GLUCOSE GEL     STEP 2: RECHECK blood sugar in 15 MINUTES STEP 3: If your blood sugar is still low at the 15 minute recheck --> then, go back to STEP 1 and treat AGAIN with another 15 grams of carbohydrates.

## 2020-04-04 NOTE — Telephone Encounter (Signed)
NA

## 2020-04-16 ENCOUNTER — Telehealth: Payer: Self-pay | Admitting: Internal Medicine

## 2020-04-16 NOTE — Telephone Encounter (Signed)
Patient called stating she's been having issues with her sugars fluctuating, patient sounded like she was a little slurry while on the phone. She asked if someone could please give her a call as soon as possible. And she also wanted to tell Dr Kelton Pillar about one of her medications.  Ph# [phone]

## 2020-04-17 NOTE — Telephone Encounter (Signed)
Message left for patient to return my call.  

## 2020-04-18 ENCOUNTER — Other Ambulatory Visit: Payer: Self-pay | Admitting: Family Medicine

## 2020-04-18 DIAGNOSIS — K921 Melena: Secondary | ICD-10-CM

## 2020-04-18 DIAGNOSIS — K279 Peptic ulcer, site unspecified, unspecified as acute or chronic, without hemorrhage or perforation: Secondary | ICD-10-CM

## 2020-04-18 NOTE — Telephone Encounter (Signed)
Requested Prescriptions  Pending Prescriptions Disp Refills  . sucralfate (CARAFATE) 1 g tablet [Pharmacy Med Name: SUCRALFATE 1GM TABLETS] 30 tablet 0    Sig: TAKE 1 TABLET BY MOUTH FOUR TIMES DAILY WITH MEALS AND AT BEDTIME AS NEEDED     Gastroenterology: Antiacids Passed - 04/18/2020  3:38 AM      Passed - Valid encounter within last 12 months    Recent Outpatient Visits          1 month ago Type 2 diabetes, controlled, with neuropathy ([place])   [facility], DO      Future Appointments            In 6 days Parks Ranger, Devonne Doughty, DO [facility], [facility]

## 2020-04-19 NOTE — Telephone Encounter (Signed)
Message left for patient to return my call.  

## 2020-04-22 ENCOUNTER — Other Ambulatory Visit: Payer: Self-pay | Admitting: Family Medicine

## 2020-04-22 DIAGNOSIS — K921 Melena: Secondary | ICD-10-CM

## 2020-04-22 DIAGNOSIS — K279 Peptic ulcer, site unspecified, unspecified as acute or chronic, without hemorrhage or perforation: Secondary | ICD-10-CM

## 2020-04-24 ENCOUNTER — Ambulatory Visit: Payer: Medicare Other | Admitting: Family Medicine

## 2020-04-26 ENCOUNTER — Other Ambulatory Visit: Payer: Self-pay | Admitting: Neurology

## 2020-04-26 DIAGNOSIS — R569 Unspecified convulsions: Secondary | ICD-10-CM

## 2020-04-30 ENCOUNTER — Ambulatory Visit
Admission: RE | Admit: 2020-04-30 | Discharge: 2020-04-30 | Disposition: A | Discharge status: Home / Self Care | Payer: Medicare Other | Source: Ambulatory Visit | Attending: Neurology | Admitting: Neurology

## 2020-04-30 ENCOUNTER — Other Ambulatory Visit: Payer: Self-pay

## 2020-04-30 ENCOUNTER — Other Ambulatory Visit: Payer: Self-pay | Admitting: Family Medicine

## 2020-04-30 DIAGNOSIS — J4540 Moderate persistent asthma, uncomplicated: Secondary | ICD-10-CM

## 2020-04-30 DIAGNOSIS — R569 Unspecified convulsions: Secondary | ICD-10-CM | POA: Insufficient documentation

## 2020-05-01 ENCOUNTER — Ambulatory Visit: Payer: Medicare Other | Admitting: Internal Medicine

## 2020-05-14 ENCOUNTER — Other Ambulatory Visit: Payer: Self-pay | Admitting: Family Medicine

## 2020-05-14 DIAGNOSIS — J4540 Moderate persistent asthma, uncomplicated: Secondary | ICD-10-CM

## 2020-05-24 ENCOUNTER — Encounter: Payer: Self-pay | Admitting: Family Medicine

## 2020-05-24 ENCOUNTER — Other Ambulatory Visit: Payer: Self-pay

## 2020-05-24 ENCOUNTER — Telehealth (INDEPENDENT_AMBULATORY_CARE_PROVIDER_SITE_OTHER): Payer: Medicare Other | Admitting: Family Medicine

## 2020-05-24 VITALS — Ht 64.0 in | Wt 172.0 lb

## 2020-05-24 DIAGNOSIS — J0110 Acute frontal sinusitis, unspecified: Secondary | ICD-10-CM | POA: Diagnosis not present

## 2020-05-24 DIAGNOSIS — N3000 Acute cystitis without hematuria: Secondary | ICD-10-CM

## 2020-05-24 MED ORDER — AMOXICILLIN-POT CLAVULANATE 875-125 MG PO TABS: 1.0000 | ORAL_TABLET | Freq: Two times a day (BID) | ORAL | 0 refills | Status: DC

## 2020-05-24 NOTE — Patient Instructions (Addendum)
Start antibiotic If not improving can follow-up within 7-10 days or seek more immediate assistance at urgent care or ED if severe  Please schedule a Follow-up Appointment to: Return if symptoms worsen or fail to improve.  If you have any other questions or concerns, please feel free to call the office or send a message through [place]. You may also schedule an earlier appointment if necessary.  Additionally, you may be receiving a survey about your experience at our office within a few days to 1 week by e-mail or mail. We value your feedback.  [name], DO [place]

## 2020-05-24 NOTE — Progress Notes (Signed)
Virtual / Telehealth Encounter - Video Visit via MyChart The purpose of this virtual visit is to provide medical care while limiting exposure to the novel coronavirus (COVID19) for both patient and office staff.  Consent was obtained for remote visit:  Yes.   Answered questions that patient had about telehealth interaction:  Yes.   I discussed the limitations, risks, security and privacy concerns of performing an evaluation and management service by video/telephone. I also discussed with the patient that there may be a patient responsible charge related to this service. The patient expressed understanding and agreed to proceed.  Patient Location: Home Provider Location: [facility] (Office)  Participants in virtual visit: - Patient: Alejandra Frederick - CMA: Orinda Kenner, [place] - Provider: Dr Parks Ranger   ---------------------------------------------------------------------- Chief Complaint  Patient presents with  . Sore Throat  . Cough  . Sinusitis    S: Reviewed CMA documentation. I have called patient and gathered additional HPI as follows:  Acute Sinusitis Reports >2-3 weeks or more now with episodic worsening sinusitis, productive cough, sore throat, fever chills, has been tested for COVID with negative results. Husband sick with similar symptoms. He has also tested negative for COVID She has had complicated sinus infection before in past required up to 14 day antibiotic. OTC meds not working for sinuses cough She has albuterol PRN  UTI Has seen Dr Albertina [place] [facility] She cannot take NSAIDs Urinary frequency with dysuria, reduced stream Admits R flank having burning pain.  Denies any known or suspected exposure to person with or possibly with COVID19.  Denies any sweats, body ache, cough, shortness of breath, sinus pain or pressure, abdominal pain, diarrhea  Past Medical History:  Diagnosis Date  . Anxiety   . Asthma   . Cervical cancer [facility])  2007   surgical resection to freeze cells.   . Complication of anesthesia    hard to wake up   . Depression   . Gastroparesis   . Headache   . High cholesterol   . Hypertension   . Myocardial infarction ([place]) 2016  . Pulmonary embolus ([place])   . Stroke [facility]) 2013   Social History   Tobacco Use  . Smoking status: Current Some Day Smoker    Packs/day: 0.25    Types: Cigarettes  . Smokeless tobacco: Never Used  Vaping Use  . Vaping Use: Never used  Substance Use Topics  . Alcohol use: No  . 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  ADVAIR DISKUS 250-50 MCG/DOSE AEPB, INHALE 1 PUFF INTO THE LUNGS TWICE DAILY, Disp: 60 each, Rfl: 2 .  albuterol (VENTOLIN HFA) 108 (90 Base) MCG/ACT inhaler, INHALE 1 TO 2 PUFFS INTO THE LUNGS EVERY 6 HOURS AS NEEDED FOR WHEEZING OR SHORTNESS OF BREATH, Disp: 54 g, Rfl: 0 .  amoxicillin-clavulanate (AUGMENTIN) 875-125 MG tablet, Take 1 tablet by mouth 2 (two) times daily. For up to 14 days, Disp: 28 tablet, Rfl: 0 .  aspirin EC 81 MG tablet, Take 81 mg by mouth at bedtime., Disp: , Rfl:  .  atorvastatin (LIPITOR) 40 MG tablet, Take 40 mg by mouth daily., Disp: , Rfl:  .  busPIRone (BUSPAR) 7.5 MG tablet, 2 (two) times daily, Disp: , Rfl:  .  cloNIDine (CATAPRES) 0.2 MG tablet, Take 0.2 mg by mouth at bedtime., Disp: , Rfl:  .  Continuous Blood Gluc Sensor (DEXCOM G6 SENSOR) MISC, Inject 1 Device  into the skin as directed., Disp: 9 each, Rfl: 3 .  Continuous Blood Gluc Transmit (DEXCOM G6 TRANSMITTER) MISC, Inject 1 Device into the skin as directed., Disp: 1 each, Rfl: 3 .  cyclobenzaprine (FLEXERIL) 10 MG tablet, Take 1 tablet (10 mg total) by mouth daily as needed for muscle spasms., Disp: 30 tablet, Rfl: 5 .  dapagliflozin propanediol (FARXIGA) 5 MG TABS tablet, Take 1 tablet (5 mg total) by mouth daily before breakfast., Disp: 30 tablet, Rfl: 6 .  diphenhydrAMINE-Zinc Acetate (ANTI-ITCH EXTRA  STRENGTH EX), Apply 1 application topically 4 (four) times daily as needed (itching.)., Disp: , Rfl:  .  EMGALITY 120 MG/ML SOAJ, Inject into the skin., Disp: , Rfl:  .  FLUoxetine (PROZAC) 20 MG capsule, Take by mouth., Disp: , Rfl:  .  glucose blood (ONETOUCH VERIO) test strip, 1 each by Other route in the morning, at noon, in the evening, and at bedtime. Use as instructed, Disp: 400 each, Rfl: 3 .  ibuprofen (ADVIL) 200 MG tablet, Take 600-800 mg by mouth 2 (two) times daily as needed (pain.)., Disp: , Rfl:  .  ibuprofen (ADVIL) 800 MG tablet, Take 1 tablet (800 mg total) by mouth every 6 (six) hours., Disp: 45 tablet, Rfl: 1 .  insulin aspart (NOVOLOG FLEXPEN) 100 UNIT/ML FlexPen, Max daily 24 units, Disp: 15 mL, Rfl: 6 .  Insulin Pen Needle 31G X 8 MM MISC, Inject 1 Device into the skin in the morning, at noon, in the evening, and at bedtime. Inject insulin up to 4 times daily as directed, Disp: 400 each, Rfl: 3 .  ketorolac (TORADOL) 30 MG/ML injection, Inject 30 mg into the muscle daily as needed (migraine headaches.)., Disp: , Rfl:  .  Lancets (ONETOUCH DELICA PLUS YBOFBP10C) MISC, 1 Device by Does not apply route as directed., Disp: 400 each, Rfl: 1 .  losartan (COZAAR) 50 MG tablet, Take 1.5 tablets (75 mg total) by mouth daily., Disp: 135 tablet, Rfl: 1 .  methylphenidate (RITALIN) 10 MG tablet, Take 30 mg by mouth 2 (two) times daily., Disp: , Rfl:  .  metoprolol succinate (TOPROL-XL) 50 MG 24 hr tablet, Take 1.5 tablets (75 mg total) by mouth daily. Take with or immediately following a meal., Disp: 135 tablet, Rfl: 1 .  morphine (MSIR) 15 MG tablet, Take 1 tablet (15 mg total) by mouth every 4 (four) hours as needed for severe pain., Disp: 21 tablet, Rfl: 0 .  naphazoline-pheniramine (VISINE-A) 0.025-0.3 % ophthalmic solution, Place 1 drop into both eyes 4 (four) times daily as needed for eye irritation., Disp: , Rfl:  .  omeprazole (PRILOSEC) 40 MG capsule, TAKE 1 CAPSULE(40 MG) BY MOUTH  DAILY BEFORE BREAKFAST, Disp: 30 capsule, Rfl: 2 .  prazosin (MINIPRESS) 5 MG capsule, Take 10 mg by mouth at bedtime., Disp: , Rfl:  .  sucralfate (CARAFATE) 1 g tablet, TAKE 1 TABLET BY MOUTH FOUR TIMES DAILY WITH MEALS AND AT BEDTIME AS NEEDED, Disp: 30 tablet, Rfl: 0 .  TRESIBA FLEXTOUCH 200 UNIT/ML FlexTouch Pen, Inject 28 Units into the skin daily., Disp: 15 mL, Rfl: 3 .  clonazePAM (KLONOPIN) 2 MG tablet, Take 1-2 mg by mouth See admin instructions. Take 1 tablet (2 mg) by mouth in the morning, 1 tablet (2 mg) by mouth in the afternoon, 0.5 tablet (1 mg) by mouth at bedtime., Disp: , Rfl:  .  fenofibrate (TRICOR) 48 MG tablet, Take by mouth., Disp: , Rfl:  .  nortriptyline (PAMELOR) 10 MG capsule,  Take 40 mg by mouth at bedtime. , Disp: , Rfl:   No flowsheet data found.  No flowsheet data found.  -------------------------------------------------------------------------- O:   Note examination was completely remotely via video observation objective data only  Gen - well-appearing, no acute distress or apparent pain, comfortable HEENT - eyes appear clear without discharge or redness Heart/Lungs - cannot examine virtually - observed no evidence of coughing or labored breathing. Abd - cannot examine virtually  Skin - face visible today- no rash Neuro - awake, alert, oriented Psych - not anxious appearing  Lab results reviewed.  Recent Results (from the past 2160 hour(s))  POCT HgB A1C     Status: Abnormal   Collection Time: 04/04/20 11:33 AM  Result Value Ref Range   Hemoglobin A1C 8.5 (A) 4.0 - 5.6 %   HbA1c POC (<> result, manual entry)     HbA1c, POC (prediabetic range)     HbA1c, POC (controlled diabetic range)      -------------------------------------------------------------------------- A&P:  Problem List Items Addressed This Visit   None   Visit Diagnoses    Acute non-recurrent frontal sinusitis    -  Primary   Relevant Medications   amoxicillin-clavulanate  (AUGMENTIN) 875-125 MG tablet   Acute cystitis without hematuria       Relevant Medications   amoxicillin-clavulanate (AUGMENTIN) 875-125 MG tablet     Consistent with acute frontal sinusitis, likely initially viral URI now with worsening concern for bacterial infection.  Additionally with UTI symptoms, has allergy to Cipro. No urine result due to remote visit and acute illness  Plan: 1 Start Augmentin 875-125mg  PO BID x 10-14 days 2. Supportive care as reviewed, nasal spray, cold medication Return criteria reviewed   Meds ordered this encounter  Medications  . amoxicillin-clavulanate (AUGMENTIN) 875-125 MG tablet    Sig: Take 1 tablet by mouth 2 (two) times daily. For up to 14 days    Dispense:  28 tablet    Refill:  0    Follow-up: PRN if not improved  Patient verbalizes understanding with the above medical recommendations including the limitation of remote medical advice.  Specific follow-up and call-back criteria were given for patient to follow-up or seek medical care more urgently if needed.   - Time spent in direct consultation with patient on video conference: 10 minutes  [name], [facility] 05/24/2020, 11:20 AM

## 2020-07-01 ENCOUNTER — Other Ambulatory Visit: Payer: Self-pay

## 2020-07-01 ENCOUNTER — Ambulatory Visit (INDEPENDENT_AMBULATORY_CARE_PROVIDER_SITE_OTHER): Payer: Medicare Other | Admitting: Internal Medicine

## 2020-07-01 ENCOUNTER — Encounter: Payer: Self-pay | Admitting: Internal Medicine

## 2020-07-01 VITALS — BP 140/86 | HR 130 | Ht 64.0 in | Wt 181.1 lb

## 2020-07-01 DIAGNOSIS — E1140 Type 2 diabetes mellitus with diabetic neuropathy, unspecified: Secondary | ICD-10-CM

## 2020-07-01 DIAGNOSIS — E1165 Type 2 diabetes mellitus with hyperglycemia: Secondary | ICD-10-CM | POA: Diagnosis not present

## 2020-07-01 DIAGNOSIS — Z794 Long term (current) use of insulin: Secondary | ICD-10-CM

## 2020-07-01 LAB — POCT GLYCOSYLATED HEMOGLOBIN (HGB A1C): Hemoglobin A1C: 9.4 % — AB (ref 4.0–5.6)

## 2020-07-01 MED ORDER — NOVOLOG FLEXPEN 100 UNIT/ML ~~LOC~~ SOPN: PEN_INJECTOR | SUBCUTANEOUS | 3 refills | Status: DC

## 2020-07-01 MED ORDER — TRESIBA FLEXTOUCH 200 UNIT/ML ~~LOC~~ SOPN: 32.0000 [IU] | PEN_INJECTOR | Freq: Every day | SUBCUTANEOUS | 3 refills | Status: DC

## 2020-07-01 MED ORDER — INSULIN PEN NEEDLE 32G X 4 MM MISC: 1.0000 | Freq: Four times a day (QID) | 3 refills | Status: DC

## 2020-07-01 MED ORDER — DAPAGLIFLOZIN PROPANEDIOL 10 MG PO TABS: 10.0000 mg | ORAL_TABLET | Freq: Every day | ORAL | 2 refills | Status: DC

## 2020-07-01 NOTE — Patient Instructions (Addendum)
-   Increase Tresiba 32 units daily  - Increase Farxiga, 10 mg tablet in the morning  - Novolog correctional insulin:  Use the scale below to help guide you:   Blood sugar before meal Number of units to inject  Less than 200 0 unit  201 -  230 2 units  231 -  260 3 units  261 -  290 4 units  291 -  320 5 units  321 -  350 6 units  351 -  380 7 units  381 -  410 8 units    HOW TO TREAT LOW BLOOD SUGARS (Blood sugar LESS THAN 70 MG/DL)  Please follow the RULE OF 15 for the treatment of hypoglycemia treatment (when your (blood sugars are less than 70 mg/dL)    STEP 1: Take 15 grams of carbohydrates when your blood sugar is low, which includes:   3-4 GLUCOSE TABS  OR  3-4 OZ OF JUICE OR REGULAR SODA OR  ONE TUBE OF GLUCOSE GEL     STEP 2: RECHECK blood sugar in 15 MINUTES STEP 3: If your blood sugar is still low at the 15 minute recheck --> then, go back to STEP 1 and treat AGAIN with another 15 grams of carbohydrates.

## 2020-07-01 NOTE — Progress Notes (Signed)
Name: Alejandra Frederick  Age/ Sex: 50 y.o., female   MRN/ DOB: [id number], 1970-11-07     PCP: Olin Hauser, DO   Reason for Endocrinology Evaluation: Type 2 Diabetes Mellitus  Initial Endocrine Consultative Visit:  04/04/2020    PATIENT IDENTIFIER: Alejandra Frederick is a 50 y.o. female with a past medical history of T2DM, Bipolar disorder, HTN , and Dyslipidemia.. The patient has followed with Endocrinology clinic since 04/04/2020 for consultative assistance with management of her diabetes.  DIABETIC HISTORY:  Alejandra Frederick was diagnosed with DM at age 50, this started as gestational diabetes.  She is intolerant to metformin due to GI side effects, she has also been tried on Ozempic/Trulicity as well as Jardiance. Her hemoglobin A1c has ranged from 7.5% in 2018, peaking at 7.6% in 2020.  On her initial visit to our clinic she had an A1c of 8.5%, she was on basal insulin as well as using NovoLog per sliding scale, we provided her with a correction scale, continue [place] and started her on Farxiga. SUBJECTIVE:   During the last visit (04/04/2020): A1c8.5% continued MDI regimen and started [place]  Today (07/01/2020): Alejandra Frederick is here for a follow up on diabetes management.  She checks her blood sugars multiple  times through CGM. The patient has not had hypoglycemic episodes since the last clinic visit.  Has been out of insulin for the past week   Has nausea and vomiting  , was recently on Amoxicillin which helped with GI issues  but developed vaginal yeast infection    HOME DIABETES REGIMEN:  Tresiba 28 units daily CF : NovoLog ( Bg-140/30) Farxiga 5 mg daily     Statin: yes ACE-I/ARB: yes     CONTINUOUS GLUCOSE MONITORING RECORD INTERPRETATION    Dates of Recording: 5/10-5/23/2022  Sensor description:dexcom  Results statistics:   CGM use % of time 100  Average and SD 239/46  Time in range     6   %  % Time Above 180 56  % Time above 250 38  % Time Below target  0      Glycemic patterns summary: hyperglycemia during the day and night   Hyperglycemic episodes  During the day and night   Hypoglycemic episodes occurred N/A  Overnight periods: high but stable         DIABETIC COMPLICATIONS: Microvascular complications:   CKD III  Denies: retinopathy, neuropathy  Last Eye Exam: Completed 2021  Macrovascular complications:    Denies: CAD, CVA, PVD   HISTORY:  Past Medical History:  Past Medical History:  Diagnosis Date  . Anxiety   . Asthma   . Cervical cancer [facility]) 2007   surgical resection to freeze cells.   . Complication of anesthesia    hard to wake up   . Depression   . Gastroparesis   . Headache   . High cholesterol   . Hypertension   . Myocardial infarction ([place]) 2016  . Pulmonary embolus ([place])   . Stroke [facility]) 2013   Past Surgical History:  Past Surgical History:  Procedure Laterality Date  . CYSTOCELE REPAIR N/A 10/03/2018   Procedure: Anterior Colporrhaphy and Culdoplasty;  Surgeon: Ward, Honor Loh, MD;  Location: [facility] ORS;  Service: Gynecology;  Laterality: N/A;  . LAPAROSCOPIC HYSTERECTOMY N/A 10/03/2018   Procedure: HYSTERECTOMY TOTAL LAPAROSCOPIC, bilateral Salpingectomy;  Surgeon: Ward, Honor Loh, MD;  Location: [facility] ORS;  Service: Gynecology;  Laterality: N/A;  . TONSILLECTOMY    . TUBAL LIGATION  Social History:  reports that she has been smoking cigarettes. She has been smoking about 0.25 packs per day. She has never used smokeless tobacco. She reports previous drug use. She reports that she does not drink alcohol. Family History:  Family History  Problem Relation Age of Onset  . Multiple myeloma Mother 42  . Bipolar disorder Mother   . Diabetes Mother   . Heart disease Father   . Bipolar disorder Father   . Multiple myeloma Sister 33  . Bipolar disorder Sister   . Prostate cancer Paternal Grandfather      HOME MEDICATIONS: Allergies as of 07/01/2020      Reactions   Bee Venom  Anaphylaxis   Ciprofloxacin Anaphylaxis   Zofran [ondansetron Hcl] Anaphylaxis   Swelling of the throat and redness of the face   Lactose Intolerance (gi) Diarrhea   Upset stomach      Medication List       Accurate as of Jul 01, 2020  2:49 PM. If you have any questions, ask your nurse or doctor.        STOP taking these medications   clonazePAM 2 MG tablet Commonly known as: KLONOPIN Stopped by: Dorita Sciara, MD   nortriptyline 10 MG capsule Commonly known as: PAMELOR Stopped by: Dorita Sciara, MD     TAKE these medications   Advair Diskus 250-50 MCG/ACT Aepb Generic drug: fluticasone-salmeterol INHALE 1 PUFF INTO THE LUNGS TWICE DAILY   albuterol 108 (90 Base) MCG/ACT inhaler Commonly known as: VENTOLIN HFA INHALE 1 TO 2 PUFFS INTO THE LUNGS EVERY 6 HOURS AS NEEDED FOR WHEEZING OR SHORTNESS OF BREATH   amoxicillin-clavulanate 875-125 MG tablet Commonly known as: AUGMENTIN Take 1 tablet by mouth 2 (two) times daily. For up to 14 days   ANTI-ITCH EXTRA STRENGTH EX Apply 1 application topically 4 (four) times daily as needed (itching.).   aspirin EC 81 MG tablet Take 81 mg by mouth at bedtime.   atorvastatin 40 MG tablet Commonly known as: LIPITOR Take 40 mg by mouth daily.   busPIRone 7.5 MG tablet Commonly known as: BUSPAR 2 (two) times daily   cloNIDine 0.2 MG tablet Commonly known as: CATAPRES Take 0.2 mg by mouth at bedtime.   cyclobenzaprine 10 MG tablet Commonly known as: FLEXERIL Take 1 tablet (10 mg total) by mouth daily as needed for muscle spasms.   dapagliflozin propanediol 10 MG Tabs tablet Commonly known as: Farxiga Take 1 tablet (10 mg total) by mouth daily. What changed:   medication strength  how much to take  when to take this Changed by: Dorita Sciara, MD   Dexcom G6 Sensor Misc Inject 1 Device into the skin as directed.   Dexcom G6 Transmitter Misc Inject 1 Device into the skin as directed.    Emgality 120 MG/ML Soaj Generic drug: Galcanezumab-gnlm Inject into the skin.   fenofibrate 48 MG tablet Commonly known as: TRICOR Take by mouth.   FLUoxetine 20 MG capsule Commonly known as: PROZAC Take by mouth.   ibuprofen 200 MG tablet Commonly known as: ADVIL Take 600-800 mg by mouth 2 (two) times daily as needed (pain.).   ibuprofen 800 MG tablet Commonly known as: ADVIL Take 1 tablet (800 mg total) by mouth every 6 (six) hours.   Insulin Pen Needle 32G X 4 MM Misc 1 Device by Does not apply route in the morning, at noon, in the evening, and at bedtime. What changed:   medication strength  how to  take this  additional instructions Changed by: Dorita Sciara, MD   ketorolac 30 MG/ML injection Commonly known as: TORADOL Inject 30 mg into the muscle daily as needed (migraine headaches.).   losartan 50 MG tablet Commonly known as: COZAAR Take 1.5 tablets (75 mg total) by mouth daily.   methylphenidate 10 MG tablet Commonly known as: RITALIN Take 30 mg by mouth 2 (two) times daily.   metoprolol succinate 50 MG 24 hr tablet Commonly known as: TOPROL-XL Take 1.5 tablets (75 mg total) by mouth daily. Take with or immediately following a meal.   morphine 15 MG tablet Commonly known as: MSIR Take 1 tablet (15 mg total) by mouth every 4 (four) hours as needed for severe pain.   NovoLOG FlexPen 100 UNIT/ML FlexPen Generic drug: insulin aspart Max daily 24 units   omeprazole 40 MG capsule Commonly known as: PRILOSEC TAKE 1 CAPSULE(40 MG) BY MOUTH DAILY BEFORE BREAKFAST   OneTouch Delica Plus [id number] Misc 1 Device by Does not apply route as directed.   OneTouch Verio test strip Generic drug: glucose blood 1 each by Other route in the morning, at noon, in the evening, and at bedtime. Use as instructed   prazosin 5 MG capsule Commonly known as: MINIPRESS Take 10 mg by mouth at bedtime.   sucralfate 1 g tablet Commonly known as: CARAFATE TAKE 1  TABLET BY MOUTH FOUR TIMES DAILY WITH MEALS AND AT BEDTIME AS NEEDED   Tresiba FlexTouch 200 UNIT/ML FlexTouch Pen Generic drug: insulin degludec Inject 32 Units into the skin daily. What changed: how much to take Changed by: Dorita Sciara, MD   Visine-A 0.025-0.3 % ophthalmic solution Generic drug: naphazoline-pheniramine Place 1 drop into both eyes 4 (four) times daily as needed for eye irritation.        OBJECTIVE:   Vital Signs: BP 140/86   Pulse (!) 130   Ht _0  (1.626 m)   Wt 181 lb 2 oz (82.2 kg)   LMP 09/11/2018 (Exact Date)   SpO2 98%   BMI 31.09 kg/m   Wt Readings from Last 3 Encounters:  07/01/20 181 lb 2 oz (82.2 kg)  05/24/20 172 lb (78 kg)  04/04/20 184 lb 4 oz (83.6 kg)     Exam: General: Pt appears well and is in NAD  Neck: General: Supple without adenopathy. Thyroid: Thyroid size normal.  No goiter or nodules appreciated.  Lungs: Clear with good BS bilat with no rales, rhonchi, or wheezes  Heart: RRR  Abdomen: Normoactive bowel sounds, soft, nontender, without masses or organomegaly palpable  Extremities: No pretibial edema.   Neuro: MS is good with appropriate affect, pt is alert and Ox3    DM foot exam: 04/04/2020  The skin of the feet is intact without sores or ulcerations. The pedal pulses are 2+ on right and 2+ on left. The sensation is intact to a screening 5.07, 10 gram monofilament bilaterally DATA REVIEWED:  Lab Results  Component Value Date   HGBA1C 9.4 (A) 07/01/2020   HGBA1C 8.5 (A) 04/04/2020   HGBA1C 7.6 (H) 10/03/2018   Lab Results  Component Value Date   CREATININE 0.86 12/11/2018   No results found for: MICRALBCREAT   Lab Results  Component Value Date   TRIG 551 (H) 08/22/2016         ASSESSMENT / PLAN / RECOMMENDATIONS:   1) Type 2 Diabetes Mellitus, poorly controlled, With CKD III and neuropathic complications - Most recent A1c of 9.4%. Goal A1c <  7.0%.   -Patient continues with hyperglycemia, she  has been out of her medication for the past week -On her initial visit with me she had fear of hypoglycemia and was keeping her BG's at above 200 mg/DL, but now it appears that this fear has improved and now she is motivated to start reducing her BG readings -She is intolerant to metformin - She has tried [employer] and Trulicity with persistent hyperglycemia -I am going to increase her [place] as well as basal insulin, she will continue with NovoLog per correction scale 3 times a day -Patient tells me that her [facility] had canceled all my prescriptions, patient is asking [facility] refills [facility] -Patient also asking for an office note to be faxed to [facility] for medical supply   MEDICATIONS: - Increase Tresiba 32 units daily  - Increase Farxiga, 10 mg tablet in the morning  -CF: NovoLog (BG -170/30)   EDUCATION / INSTRUCTIONS:  BG monitoring instructions: Patient is instructed to check her blood sugars 3 times a day, breakfast lunch dinner times.  Call [facility] if: BG persistently < 70 . I reviewed the Rule of 15 for the treatment of hypoglycemia in detail with the patient. Literature supplied.     2) Diabetic complications:   Eye: Does not have known diabetic retinopathy.   Neuro/ Feet: Does not have known diabetic peripheral neuropathy .   Renal: Patient does have known baseline CKD. She is on an ACEI/ARB at present.  She follows with [facility] nephrology.  Patient her GFR is above 35, ~in the 50s   3) Hypertriglyceridemia:   - Tg as high as 551 mg/dL in 2018.  Per patient she had recent lab work through nephrology, these records are not available at this time.     - Patient is on Atorvastatin 40 mg daily    F/U in 4 months   Signed electronically by: Mack Guise, MD  [facility] Endocrinology  [facility] [address] [place], [place] [address] Phone: [phone] FAX:  [phone]   CC: Olin Hauser, DO [place] [address] Phone: [phone]  Fax: [phone]  Return to Endocrinology clinic as below: No future appointments.

## 2020-07-24 ENCOUNTER — Telehealth: Payer: Self-pay | Admitting: Family Medicine

## 2020-07-24 ENCOUNTER — Encounter: Payer: Self-pay | Admitting: Family Medicine

## 2020-07-24 ENCOUNTER — Other Ambulatory Visit: Payer: Self-pay

## 2020-07-24 ENCOUNTER — Ambulatory Visit (INDEPENDENT_AMBULATORY_CARE_PROVIDER_SITE_OTHER): Payer: Medicare Other | Admitting: Family Medicine

## 2020-07-24 ENCOUNTER — Telehealth: Payer: Self-pay | Admitting: *Deleted

## 2020-07-24 VITALS — BP 129/61 | HR 79 | Ht 64.0 in | Wt 180.2 lb

## 2020-07-24 DIAGNOSIS — N183 Chronic kidney disease, stage 3 unspecified: Secondary | ICD-10-CM

## 2020-07-24 DIAGNOSIS — K581 Irritable bowel syndrome with constipation: Secondary | ICD-10-CM | POA: Diagnosis not present

## 2020-07-24 DIAGNOSIS — T3695XA Adverse effect of unspecified systemic antibiotic, initial encounter: Secondary | ICD-10-CM

## 2020-07-24 DIAGNOSIS — I129 Hypertensive chronic kidney disease with stage 1 through stage 4 chronic kidney disease, or unspecified chronic kidney disease: Secondary | ICD-10-CM

## 2020-07-24 DIAGNOSIS — E781 Pure hyperglyceridemia: Secondary | ICD-10-CM

## 2020-07-24 DIAGNOSIS — N3000 Acute cystitis without hematuria: Secondary | ICD-10-CM | POA: Diagnosis not present

## 2020-07-24 DIAGNOSIS — Z794 Long term (current) use of insulin: Secondary | ICD-10-CM

## 2020-07-24 DIAGNOSIS — B379 Candidiasis, unspecified: Secondary | ICD-10-CM

## 2020-07-24 DIAGNOSIS — R000 Tachycardia, unspecified: Secondary | ICD-10-CM

## 2020-07-24 DIAGNOSIS — F3160 Bipolar disorder, current episode mixed, unspecified: Secondary | ICD-10-CM

## 2020-07-24 DIAGNOSIS — E0822 Diabetes mellitus due to underlying condition with diabetic chronic kidney disease: Secondary | ICD-10-CM

## 2020-07-24 DIAGNOSIS — N1831 Chronic kidney disease, stage 3a: Secondary | ICD-10-CM

## 2020-07-24 MED ORDER — FLUCONAZOLE 150 MG PO TABS
ORAL_TABLET | ORAL | 0 refills | Status: AC
Start: 2020-07-24 — End: ?

## 2020-07-24 MED ORDER — CEPHALEXIN 500 MG PO CAPS: 500.0000 mg | ORAL_CAPSULE | Freq: Three times a day (TID) | ORAL | 0 refills | Status: DC
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
## 2020-07-24 NOTE — Patient Instructions (Addendum)
Thank you for coming to the office today.  Labs today  Urine culture today.  Start Antibiotic Keflex and use Yeast pill if needed.  [facility]) HeartCare at [address] [place], [place] [address] Main: [facility]) [place], [place] [address] Phone: [phone]  ----------------------------------   Please schedule a Follow-up Appointment to: Return if symptoms worsen or fail to improve.  If you have any other questions or concerns, please feel free to call the office or send a message through [place]. You may also schedule an earlier appointment if necessary.  Additionally, you may be receiving a survey about your experience at our office within a few days to 1 week by e-mail or mail. We value your feedback.  [name], DO [place]

## 2020-07-24 NOTE — Chronic Care Management (AMB) (Signed)
  Chronic Care Management   Outreach Note  07/24/2020 Name: Alejandra Frederick MRN: [id number] DOB: 11-22-1970  Alejandra Frederick is a 50 y.o. year old female who is a primary care patient of Olin Hauser, DO. I reached out to [name] by phone today in response to a referral sent by Ms. Farren Cuadras's PCP Olin Hauser, DO     An unsuccessful telephone outreach was attempted today. The patient was referred to the case management team for assistance with care management and care coordination.   Follow Up Plan: A HIPAA compliant phone message was left for the patient providing contact information and requesting a return call.  If patient returns call to provider office, please advise to call Embedded Care Management Care Guide Marka Treloar at [phone]  [facility]

## 2020-07-24 NOTE — Progress Notes (Signed)
Subjective:    Patient ID: Alejandra Frederick, female    DOB: 09-30-1970, 50 y.o.   MRN: [id number]  Alejandra Frederick is a 50 y.o. female presenting on 07/24/2020 for painful urination   HPI  Hypertriglyceridemia On Atorvastatin, on Fenofibrate Needs Lipid panel repeated. Was not drawn with last labs.  Tachycardia, HTN Previously at [facility] Cardiology Needs updated referral to new cardiology in network. She has episodic inappropriate tachycardia HR 130-150+. She has episodic high blood pressure as well.  Dysuria IBS-C History of constipation with IBS worsening her urination. Recent course with following constipation, she is having dark urine and dysuria burning with voiding. Describes urinary frequency. Also some tissue possibly in the urine. Has some back pain radiating into pelvic area as well.  No flowsheet data found.  Social History   Tobacco Use   Smoking status: Some Days    Packs/day: 0.25    Pack years: 0.00    Types: Cigarettes   Smokeless tobacco: Never  Vaping Use   Vaping Use: Never used  Substance Use Topics   Alcohol use: No   Drug use: Not Currently    Comment: Abstained since age 46    Review of Systems Per HPI unless specifically indicated above     Objective:    BP 129/61   Pulse 79   Ht 5\' 4"  (1.626 m)   Wt 180 lb 3.2 oz (81.7 kg)   LMP 09/11/2018 (Exact Date)   SpO2 99%   BMI 30.93 kg/m   Wt Readings from Last 3 Encounters:  07/24/20 180 lb 3.2 oz (81.7 kg)  07/01/20 181 lb 2 oz (82.2 kg)  05/24/20 172 lb (78 kg)    Physical Exam Vitals and nursing note reviewed.  Constitutional:      General: She is not in acute distress.    Appearance: She is well-developed. She is not diaphoretic.     Comments: Well-appearing, comfortable, cooperative  HENT:     Head: Normocephalic and atraumatic.  Eyes:     General:        Right eye: No discharge.        Left eye: No discharge.     Conjunctiva/sclera: Conjunctivae normal.  Neck:      Thyroid: No thyromegaly.  Cardiovascular:     Rate and Rhythm: Normal rate and regular rhythm.     Heart sounds: Normal heart sounds. No murmur heard. Pulmonary:     Effort: Pulmonary effort is normal. No respiratory distress.     Breath sounds: Normal breath sounds. No wheezing or rales.  Musculoskeletal:        General: Normal range of motion.     Cervical back: Normal range of motion and neck supple.  Lymphadenopathy:     Cervical: No cervical adenopathy.  Skin:    General: Skin is warm and dry.     Findings: No erythema or rash.  Neurological:     Mental Status: She is alert and oriented to person, place, and time.  Psychiatric:        Behavior: Behavior normal.     Comments: Well groomed, good eye contact, normal speech and thoughts     Results for orders placed or performed in visit on 07/01/20  POCT HgB A1C  Result Value Ref Range   Hemoglobin A1C 9.4 (A) 4.0 - 5.6 %   HbA1c POC (<> result, manual entry)     HbA1c, POC (prediabetic range)     HbA1c, POC (controlled diabetic range)  Assessment & Plan:   Problem List Items Addressed This Visit     Tachycardia   Relevant Orders   Ambulatory referral to Cardiology   Irritable bowel syndrome with constipation   Relevant Orders   Ambulatory referral to Gastroenterology   Hypertriglyceridemia - Primary   Relevant Orders   Lipid panel   COMPLETE METABOLIC PANEL WITH GFR   Diabetes mellitus due to underlying condition with stage 3a chronic kidney disease, with long-term current use of insulin (HCC)   Relevant Orders   COMPLETE METABOLIC PANEL WITH GFR   CBC with Differential/Platelet   Bipolar I disorder, most recent episode mixed ([place])   Relevant Orders   AMB Referral to Community Care Coordinaton   Benign hypertension with CKD (chronic kidney disease) stage III ([place])   Relevant Orders   Ambulatory referral to Cardiology   Other Visit Diagnoses     Acute cystitis without hematuria       Relevant  Medications   cephALEXin (KEFLEX) 500 MG capsule   Other Relevant Orders   Urine Culture   Antibiotic-induced yeast infection       Relevant Medications   cephALEXin (KEFLEX) 500 MG capsule   fluconazole (DIFLUCAN) 150 MG tablet      Acute Cystitis Hematuria, microscopic Symptoms of UTI  Will treat with Keflex 500 TID x 7 days Add Fluconazole for yeast Urine Culture May need Urology in future, given associated with her IBS-C constipation and concern for possible bladder fistula if linked to her bowel movements. Follow-up  HTN / CKD / Tachycardia Previously with [facility] Has history of fluctuating HR related to HTN and Stress/Anxiety On Clonidine, Metoprolol, Prazosin Will refer to other Cardiology by her request for further work up on Tachycardia  IBS-Constipation type Chronic problem worsening, had seen GI [place] Now referral to new local GI for management of IBS Constipation Impacting her urinary symptoms, with constipation  Type 2 Diabetes Managed by Dr Leon[facility]  Hyperlipidemia Due for lipid panel On Statin + Fibrate     Orders Placed This Encounter  Procedures   Urine Culture   Lipid panel    Order Specific Question:   Has the patient fasted?    Answer:   Yes   COMPLETE METABOLIC PANEL WITH GFR   CBC with Differential/Platelet   Ambulatory referral to Gastroenterology    Referral Priority:   Routine    Referral Type:   Consultation    Referral Reason:   Specialty Services Required    Number of Visits Requested:   1   Ambulatory referral to Cardiology    Referral Priority:   Routine    Referral Type:   Consultation    Referral Reason:   Specialty Services Required    Requested Specialty:   Cardiology    Number of Visits Requested:   1   AMB Referral to [facility] Coordinaton    Referral Priority:   Routine    Referral Type:   Consultation    Referral Reason:   Care Coordination    Number of Visits Requested:   1       Meds ordered this encounter  Medications   cephALEXin (KEFLEX) 500 MG capsule    Sig: Take 1 capsule (500 mg total) by mouth 3 (three) times daily. For 7 days    Dispense:  21 capsule    Refill:  0   fluconazole (DIFLUCAN) 150 MG tablet    Sig: Take one tablet by mouth  on Day 1. Repeat dose 2nd tablet on Day 3.    Dispense:  2 tablet    Refill:  0     Follow up plan: Return if symptoms worsen or fail to improve.    [name], [facility] 07/24/2020, 11:14 AM

## 2020-07-25 DIAGNOSIS — E781 Pure hyperglyceridemia: Secondary | ICD-10-CM

## 2020-07-26 ENCOUNTER — Other Ambulatory Visit: Payer: Self-pay | Admitting: Family Medicine

## 2020-07-26 ENCOUNTER — Telehealth: Payer: Self-pay | Admitting: Family Medicine

## 2020-07-26 DIAGNOSIS — K279 Peptic ulcer, site unspecified, unspecified as acute or chronic, without hemorrhage or perforation: Secondary | ICD-10-CM

## 2020-07-26 DIAGNOSIS — K921 Melena: Secondary | ICD-10-CM

## 2020-07-26 LAB — COMPLETE METABOLIC PANEL WITH GFR
AG Ratio: 1.6 (calc) (ref 1.0–2.5)
ALT: 21 U/L (ref 6–29)
AST: 14 U/L (ref 10–35)
Albumin: 4.4 g/dL (ref 3.6–5.1)
Alkaline phosphatase (APISO): 116 U/L (ref 37–153)
BUN: 8 mg/dL (ref 7–25)
CO2: 22 mmol/L (ref 20–32)
Calcium: 9.2 mg/dL (ref 8.6–10.4)
Chloride: 106 mmol/L (ref 98–110)
Creat: 0.79 mg/dL (ref 0.50–1.05)
GFR, Est African American: 101 mL/min/{1.73_m2} (ref >=60)
GFR, Est Non African American: 87 mL/min/{1.73_m2} (ref >=60)
Globulin: 2.7 g/dL (calc) (ref 1.9–3.7)
Glucose, Bld: 208 mg/dL — ABNORMAL HIGH (ref 65–99)
Potassium: 4.3 mmol/L (ref 3.5–5.3)
Sodium: 138 mmol/L (ref 135–146)
Total Bilirubin: 0.5 mg/dL (ref 0.2–1.2)
Total Protein: 7.1 g/dL (ref 6.1–8.1)

## 2020-07-26 LAB — LIPID PANEL
Cholesterol: 118 mg/dL (ref <200)
HDL: 54 mg/dL (ref >=50)
LDL Cholesterol (Calc): 43 mg/dL (calc)
Non-HDL Cholesterol (Calc): 64 mg/dL (calc) (ref <130)
Total CHOL/HDL Ratio: 2.2 (calc) (ref <5.0)
Triglycerides: 125 mg/dL (ref <150)

## 2020-07-26 LAB — CBC WITH DIFFERENTIAL/PLATELET
Absolute Monocytes: 401 cells/uL (ref 200–950)
Basophils Absolute: 27 cells/uL (ref 0–200)
Basophils Relative: 0.3 %
Eosinophils Absolute: 107 cells/uL (ref 15–500)
Eosinophils Relative: 1.2 %
HCT: 41.9 % (ref 35.0–45.0)
Hemoglobin: 13.5 g/dL (ref 11.7–15.5)
Lymphs Abs: 2448 cells/uL (ref 850–3900)
MCH: 29.2 pg (ref 27.0–33.0)
MCHC: 32.2 g/dL (ref 32.0–36.0)
MCV: 90.5 fL (ref 80.0–100.0)
MPV: 9.9 fL (ref 7.5–12.5)
Monocytes Relative: 4.5 %
Neutro Abs: 5919 cells/uL (ref 1500–7800)
Neutrophils Relative %: 66.5 %
Platelets: 255 10*3/uL (ref 140–400)
RBC: 4.63 10*6/uL (ref 3.80–5.10)
RDW: 13.9 % (ref 11.0–15.0)
Total Lymphocyte: 27.5 %
WBC: 8.9 10*3/uL (ref 3.8–10.8)

## 2020-07-26 LAB — URINE CULTURE
MICRO NUMBER:: 12012332
SPECIMEN QUALITY:: ADEQUATE

## 2020-07-26 MED ORDER — ATORVASTATIN CALCIUM 40 MG PO TABS: 40.0000 mg | ORAL_TABLET | Freq: Every day | ORAL | 3 refills | Status: AC
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
## 2020-07-26 NOTE — Chronic Care Management (AMB) (Signed)
    Clinical Social Work  Chronic Care Management   Phone Outreach    07/26/2020 Name: Bertha Lokken MRN: [id number] DOB: Feb 24, 1970  Habiba Treloar is a 50 y.o. year old female who is a primary care patient of Olin Hauser, DO .   CCM LCSW reached out to patient today by phone to introduce self, assess needs and offer Care Management services and interventions.     Plan: Patient reported that it was not an appropriate time to talk. Patient agreed to call CCM LCSW on Monday, June 20, 22  Review of patient status, including review of consultants reports, relevant laboratory and other test results, and collaboration with appropriate care team members and the patient's provider was performed as part of comprehensive patient evaluation and provision of care management services.    Christa See, MSW, [facility] [email or web address] Management [email or web address] Phone [phone] 2:03 PM

## 2020-07-26 NOTE — Telephone Encounter (Signed)
Pts husband called back and was not coherent, declined to be transferred to NT.

## 2020-07-27 NOTE — ED Provider Notes (Signed)
-----------------------------------------   12:26 PM on 07/27/2020 -----------------------------------------  The patient's husband Sena Hitch is currently a behavioral patient in the [facility] ED.  I was alerted by one of the pharmacists who spoke to Mrs. Grandmaison today in relation to his care that Mrs. Bahri had multiple medical concerns and wanted someone to be aware of them.  I called Mrs. Niday and asked her how we could help.  She informed me that she has multiple medical concerns specifically that she believes her husband may have been poisoning her and she may have worms in her stool.  She has had abdominal discomfort, urinary symptoms, and vomiting.  She states that her husband has prevented her from getting care for these issues.  She is specifically concerned that she had "12 million" bacteria on a urine test.  I see that she had a similar telephone encounter with Dr. Parks Ranger yesterday and was advised to come to the ED at that time.  She also has concerns about her husband being discharged in about her own safety.  He is currently under IVC at this time.  I advised Mrs. Eblin to come to the ED for further evaluation so that we can address her medical issues and make sure that she is safe.  She agreed, however states that she has service dogs which will need care.  She also states that a detective is coming to her house at this time to get information with her about her husband, and states that they should be able to help her with animal services.  She states that once this is done she will come to the ED.  I strongly encouraged her to come to the ED as soon as she is able to including contacting 911 if necessary.  I assured her that we would keep her safe.  She agreed.  During our entire conversation, the patient was coherent, speaking clearly, and answering questions appropriately.     Arta Silence, MD 07/27/20 1231

## 2020-07-29 ENCOUNTER — Other Ambulatory Visit: Payer: Self-pay

## 2020-07-29 ENCOUNTER — Ambulatory Visit: Payer: Self-pay

## 2020-07-29 ENCOUNTER — Telehealth: Payer: Self-pay | Admitting: Licensed Clinical Social Worker

## 2020-07-29 ENCOUNTER — Ambulatory Visit: Payer: Medicare Other | Admitting: Cardiology

## 2020-07-29 NOTE — Telephone Encounter (Signed)
    Clinical Social Work  Care Management   Phone Outreach    07/29/2020 Name: Alejandra Frederick MRN: [id number] DOB: 1971/02/07  Alejandra Frederick is a 50 y.o. year old female who is a primary care patient of Olin Hauser, DO .   CCM LCSW reached out to patient today by phone to introduce self, assess needs and offer Care Management services and interventions. Patient reported that it was not an appropriate time to talk. Patient reports that she has an upcoming appointment with Cardiology scheduled on Monday, June 20, 22. She would like to return CCM LCSW's phone call afterwards, stating that it would be safe to talk.   Plan: CCM LCSW will wait for return call on Monday, June 20. Patient was strongly encouraged to contact CCM LCSW or clinic with any questions or concerns.  Review of patient status, including review of consultants reports, relevant laboratory and other test results, and collaboration with appropriate care team members and the patient's provider was performed as part of comprehensive patient evaluation and provision of care management services.    Christa See, MSW, [facility] Care Management [email or web address] Phone [phone] 8:37 AM

## 2020-07-29 NOTE — Telephone Encounter (Signed)
error 

## 2020-07-29 NOTE — Telephone Encounter (Signed)
Pt was  concerned that she may be sicker according to [name]. Pt denies new sx. Is worried she may have fecal worms. Her husband is back home from his 60 hour hold. She stated he defecates "everywhere' and wont pick it up. Pt is concerned about him and him being :unstable."  Pt does have a "bug out bag' that she can grab if she has to flee and knows to call 911.         Reason for Disposition  Health Information question, no triage required and triager able to answer question  Answer Assessment - Initial Assessment Questions 1. REASON FOR CALL or QUESTION: "What is your reason for calling today?" or "How can I best help you?" or "What question do you have that I can help answer?"     Pt concerned that looked that she is contagious and should she eat yogurt. Pt stated that she read [name] and was worried she may have E coli meningitis. No sx.  Protocols used: Information Only Call - No Triage-A-AH

## 2020-07-29 NOTE — Chronic Care Management (AMB) (Signed)
  Care Management  Note   07/29/2020 Name: Alejandra Frederick MRN: [id number] DOB: July 24, 1970  Alejandra Frederick is a 50 y.o. year old female who is a primary care patient of Olin Hauser, DO. The care management team was consulted for assistance with chronic disease management and care coordination needs.   [name] was given information about Care Management services today including:  CCM service includes personalized support from designated clinical staff supervised by the physician, including individualized plan of care and coordination with other care providers 24/7 contact phone numbers for assistance for urgent and routine care needs. Service will only be billed when office clinical staff spend 20 minutes or more in a month to coordinate care. Only one practitioner may furnish and bill the service in a calendar month. The patient may stop CCM services at amy time (effective at the end of the month) by phone call to the office staff. The patient will be responsible for cost sharing (co-pay) or up to 20% of the service fee (after annual deductible is met)  Patient agreed to services and verbal consent obtained.  Follow up plan:   An initial telephone outreach has been scheduled for: 07/30/20  Julian Hy, [place] Management  Direct Dial: [phone]

## 2020-07-30 ENCOUNTER — Other Ambulatory Visit: Payer: Self-pay

## 2020-07-30 ENCOUNTER — Ambulatory Visit (INDEPENDENT_AMBULATORY_CARE_PROVIDER_SITE_OTHER): Payer: Medicare Other | Admitting: Licensed Clinical Social Worker

## 2020-07-30 ENCOUNTER — Ambulatory Visit (INDEPENDENT_AMBULATORY_CARE_PROVIDER_SITE_OTHER): Payer: Medicare Other | Admitting: Gastroenterology

## 2020-07-30 ENCOUNTER — Encounter: Payer: Self-pay | Admitting: Gastroenterology

## 2020-07-30 VITALS — BP 122/81 | HR 73 | Temp 98.5°F | Ht 64.0 in | Wt 174.4 lb

## 2020-07-30 DIAGNOSIS — I129 Hypertensive chronic kidney disease with stage 1 through stage 4 chronic kidney disease, or unspecified chronic kidney disease: Secondary | ICD-10-CM

## 2020-07-30 DIAGNOSIS — I10 Essential (primary) hypertension: Secondary | ICD-10-CM | POA: Diagnosis not present

## 2020-07-30 DIAGNOSIS — K5904 Chronic idiopathic constipation: Secondary | ICD-10-CM | POA: Diagnosis not present

## 2020-07-30 DIAGNOSIS — R1011 Right upper quadrant pain: Secondary | ICD-10-CM | POA: Diagnosis not present

## 2020-07-30 DIAGNOSIS — F3160 Bipolar disorder, current episode mixed, unspecified: Secondary | ICD-10-CM | POA: Diagnosis not present

## 2020-07-30 DIAGNOSIS — Z1211 Encounter for screening for malignant neoplasm of colon: Secondary | ICD-10-CM

## 2020-07-30 DIAGNOSIS — E1140 Type 2 diabetes mellitus with diabetic neuropathy, unspecified: Secondary | ICD-10-CM

## 2020-07-30 DIAGNOSIS — N183 Chronic kidney disease, stage 3 unspecified: Secondary | ICD-10-CM

## 2020-07-30 DIAGNOSIS — R102 Pelvic and perineal pain: Secondary | ICD-10-CM | POA: Insufficient documentation

## 2020-07-30 MED ORDER — NA SULFATE-K SULFATE-MG SULF 17.5-3.13-1.6 GM/177ML PO SOLN: 354.0000 mL | Freq: Once | ORAL | 0 refills | Status: AC

## 2020-07-30 MED ORDER — MAGNESIUM CITRATE PO SOLN: 1.0000 | Freq: Once | ORAL | 0 refills | Status: AC

## 2020-07-30 NOTE — Patient Instructions (Addendum)
Please take Miralax as needed for constipation one to two tablets daily.  Your RUQ ultrasound is schedule for 08/21/2020 arrived at 10:00 at out patient imaging  Nothing to eat or drink after midnight. Address is [address] [place], [place] [address]. Phone number is [phone].

## 2020-07-30 NOTE — Progress Notes (Signed)
Alejandra Darby, MD [address]  Alejandra  [place], Alejandra [place] [address]  Main: [phone]  Fax: [phone]    Gastroenterology Consultation  Referring Provider:     [name] * Primary Care Physician:  Alejandra Hauser, DO Primary Gastroenterologist:  Dr. Cephas Frederick Reason for Consultation:     Lower abdominal pain, chronic constipation, abdominal bloating        HPI:   Alejandra Frederick is a 50 y.o. female referred by Dr. Parks Frederick, Alejandra Doughty, DO  for consultation & management of long history of chronic constipation.  Patient reports that she has irregular bowel habits, about every 3 days to a week associated with abdominal bloating and lower abdominal discomfort.  She was recommended to start high-fiber diet along with stool softener by her nephrologist and she felt relieved on this regimen.  However, she now has regular bowel habits as she is not sure how long she can be on a stool softener.  Her last BM was about 2 days ago, she currently reports abdominal bloating as well.  She has history of heartburn, intermittent triggered after a big meal.  She does report intermittent episodes of right upper quadrant pain radiating to right upper back triggered after a meal  She does have history of poorly controlled diabetes  NSAIDs: None  Antiplts/Anticoagulants/Anti thrombotics: None  GI Procedures: None No family history of colon cancer  Past Medical History:  Diagnosis Date   Anxiety    Asthma    Cervical cancer ([place]) 2007   surgical resection to freeze cells.    Complication of anesthesia    hard to wake up    Depression    Gastroparesis    Headache    High cholesterol    Hypertension    Myocardial infarction [facility]) 2016   Pulmonary embolus ([place])    Stroke [facility]) 2013    Past Surgical History:  Procedure Laterality Date   CYSTOCELE REPAIR N/A 10/03/2018   Procedure: Anterior Colporrhaphy and Culdoplasty;  Surgeon: Alejandra Frederick, Alejandra Loh, MD;   Location: [facility] ORS;  Service: Gynecology;  Laterality: N/A;   LAPAROSCOPIC HYSTERECTOMY N/A 10/03/2018   Procedure: HYSTERECTOMY TOTAL LAPAROSCOPIC, bilateral Salpingectomy;  Surgeon: Alejandra Frederick, Alejandra Loh, MD;  Location: [facility] ORS;  Service: Gynecology;  Laterality: N/A;   TONSILLECTOMY     TUBAL LIGATION       Current Outpatient Medications:    ADVAIR DISKUS 250-50 MCG/DOSE AEPB, INHALE 1 PUFF INTO THE LUNGS TWICE DAILY, Disp: 60 each, Rfl: 2   albuterol (VENTOLIN HFA) 108 (90 Base) MCG/ACT inhaler, INHALE 1 TO 2 PUFFS INTO THE LUNGS EVERY 6 HOURS AS NEEDED FOR WHEEZING OR SHORTNESS OF BREATH, Disp: 54 g, Rfl: 0   atorvastatin (LIPITOR) 40 MG tablet, Take 1 tablet (40 mg total) by mouth daily., Disp: 90 tablet, Rfl: 3   busPIRone (BUSPAR) 7.5 MG tablet, Take by mouth., Disp: , Rfl:    cephALEXin (KEFLEX) 500 MG capsule, Take 1 capsule (500 mg total) by mouth 3 (three) times daily. For 7 days, Disp: 21 capsule, Rfl: 0   cloNIDine (CATAPRES) 0.3 MG tablet, Take 0.3 mg by mouth 2 (two) times daily., Disp: , Rfl:    Continuous Blood Gluc Sensor (DEXCOM G6 SENSOR) MISC, Inject 1 Device into the skin as directed., Disp: 9 each, Rfl: 3   Continuous Blood Gluc Transmit (DEXCOM G6 TRANSMITTER) MISC, Inject 1 Device into the skin as directed., Disp: 1 each, Rfl: 3   cyclobenzaprine (FLEXERIL) 10  MG tablet, Take 1 tablet (10 mg total) by mouth daily as needed for muscle spasms., Disp: 30 tablet, Rfl: 5   dapagliflozin propanediol (FARXIGA) 10 MG TABS tablet, Take 1 tablet (10 mg total) by mouth daily., Disp: 90 tablet, Rfl: 2   diphenhydrAMINE-Zinc Acetate (ANTI-ITCH EXTRA STRENGTH EX), Apply 1 application topically 4 (four) times daily as needed (itching.)., Disp: , Rfl:    divalproex (DEPAKOTE) 125 MG DR tablet, Take 125 mg by mouth 2 (two) times daily., Disp: , Rfl:    EMGALITY 120 MG/ML SOAJ, Inject into the skin., Disp: , Rfl:    fluconazole (DIFLUCAN) 150 MG tablet, Take one tablet by mouth on Day 1.  Repeat dose 2nd tablet on Day 3., Disp: 2 tablet, Rfl: 0   FLUoxetine (PROZAC) 20 MG capsule, Take by mouth., Disp: , Rfl:    FLUoxetine (PROZAC) 20 MG capsule, Take by mouth., Disp: , Rfl:    glucose blood (ONETOUCH VERIO) test strip, 1 each by Other route in the morning, at noon, in the evening, and at bedtime. Use as instructed, Disp: 400 each, Rfl: 3   haloperidol (HALDOL) 1 MG tablet, Take by mouth., Disp: , Rfl:    insulin aspart (NOVOLOG FLEXPEN) 100 UNIT/ML FlexPen, Max daily 24 units, Disp: 30 mL, Rfl: 3   Insulin Pen Needle 32G X 4 MM MISC, 1 Device by Does not apply route in the morning, at noon, in the evening, and at bedtime., Disp: 400 each, Rfl: 3   ketorolac (TORADOL) 30 MG/ML injection, Inject 30 mg into the muscle daily as needed (migraine headaches.)., Disp: , Rfl:    Lancets (ONETOUCH DELICA PLUS JHERDE08X) MISC, 1 Device by Does not apply route as directed., Disp: 400 each, Rfl: 1   losartan (COZAAR) 50 MG tablet, Take by mouth., Disp: , Rfl:    methylphenidate (RITALIN) 10 MG tablet, 3 tabs in AM and 3 tabs in PM, Disp: , Rfl:    metoprolol succinate (TOPROL-XL) 50 MG 24 hr tablet, Take 1.5 tablets (75 mg total) by mouth daily. Take with or immediately following a meal., Disp: 135 tablet, Rfl: 1   naphazoline-pheniramine (VISINE-A) 0.025-0.3 % ophthalmic solution, Place 1 drop into both eyes 4 (four) times daily as needed for eye irritation., Disp: , Rfl:    prazosin (MINIPRESS) 5 MG capsule, Take 10 mg by mouth at bedtime., Disp: , Rfl:    TRESIBA FLEXTOUCH 200 UNIT/ML FlexTouch Pen, Inject 32 Units into the skin daily., Disp: 15 mL, Rfl: 3   magnesium citrate SOLN, Take 296 mLs (1 Bottle total) by mouth once for 1 dose., Disp: 195 mL, Rfl: 0   Na Sulfate-K Sulfate-Mg Sulf 17.5-3.13-1.6 GM/177ML SOLN, Take 354 mLs by mouth once for 1 dose., Disp: 354 mL, Rfl: 0   Family History  Problem Relation Age of Onset   Multiple myeloma Mother 62   Bipolar disorder Mother     Diabetes Mother    Heart disease Father    Bipolar disorder Father    Multiple myeloma Sister 30   Bipolar disorder Sister    Prostate cancer Paternal Grandfather      Social History   Tobacco Use   Smoking status: Some Days    Packs/day: 0.25    Pack years: 0.00    Types: Cigarettes   Smokeless tobacco: Never  Vaping Use   Vaping Use: Never used  Substance Use Topics   Alcohol use: No   Drug use: Not Currently    Comment: Abstained since  age 30    Allergies as of 07/30/2020 - Review Complete 07/30/2020  Allergen Reaction Noted   Bee venom Anaphylaxis 10/15/2016   Ciprofloxacin Anaphylaxis 08/02/2016   Iodinated diagnostic agents Swelling 05/24/2017   Zofran [ondansetron hcl] Anaphylaxis 10/15/2016   Lactose intolerance (gi) Diarrhea 09/21/2018   Olanzapine Nausea Only and Other (See Comments) 05/18/2019    Review of Systems:    All systems reviewed and negative except where noted in HPI.   Physical Exam:  BP 122/81 (BP Location: Left Arm, Patient Position: Sitting, Cuff Size: Normal)   Pulse 73   Temp 98.5 F (36.9 C) (Oral)   Ht '5\' 4"'  (1.626 m)   Wt 174 lb 6 oz (79.1 kg)   LMP 09/11/2018 (Exact Date)   BMI 29.93 kg/m  Patient's last menstrual period was 09/11/2018 (exact date).  General:   Alert,  Well-developed, well-nourished, pleasant and cooperative in NAD Head:  Normocephalic and atraumatic. Eyes:  Sclera clear, no icterus.   Conjunctiva pink. Ears:  Normal auditory acuity. Nose:  No deformity, discharge, or lesions. Mouth:  No deformity or lesions,oropharynx pink & moist. Neck:  Supple; no masses or thyromegaly. Lungs:  Respirations even and unlabored.  Clear throughout to auscultation.   No wheezes, crackles, or rhonchi. No acute distress. Heart:  Regular rate and rhythm; no murmurs, clicks, rubs, or gallops. Abdomen:  Normal bowel sounds. Soft, non-tender and moderately distended, tympanic to percussion, without masses, hepatosplenomegaly or hernias  noted.  No guarding or rebound tenderness.   Rectal: Not performed Msk:  Symmetrical without gross deformities. Good, equal movement & strength bilaterally. Pulses:  Normal pulses noted. Extremities:  No clubbing or edema.  No cyanosis. Neurologic:  Alert and oriented x3;  grossly normal neurologically. Skin:  Intact without significant lesions or rashes. No jaundice. Psych:  Alert and cooperative. Normal mood and affect.  Imaging Studies: Reviewed  Assessment and Plan:   Jayonna Meyering is a 50 y.o. female with history of diabetes, tobacco use is seen in consultation for chronic constipation and abdominal bloating, intermittent right upper quadrant pain triggered after meals  Chronic constipation with abdominal bloating Discussed about high-fiber diet, information provided Advised patient to take MiraLAX daily Discussed about adequate intake of water, at least 2 to 3 L daily  Right upper quadrant pain Recommend right upper quadrant ultrasound to evaluate for cholelithiasis LFTs have been normal on 6/15  Colon cancer screening Recommend screening colonoscopy with 2-day prep  Follow up in 3 months   Alejandra Darby, MD

## 2020-07-31 ENCOUNTER — Ambulatory Visit: Payer: Self-pay | Admitting: *Deleted

## 2020-07-31 ENCOUNTER — Ambulatory Visit (INDEPENDENT_AMBULATORY_CARE_PROVIDER_SITE_OTHER): Payer: Medicare Other | Admitting: Family Medicine

## 2020-07-31 ENCOUNTER — Encounter: Payer: Self-pay | Admitting: Family Medicine

## 2020-07-31 VITALS — BP 160/80 | Ht 64.0 in | Wt 174.0 lb

## 2020-07-31 DIAGNOSIS — R140 Abdominal distension (gaseous): Secondary | ICD-10-CM

## 2020-07-31 DIAGNOSIS — K5900 Constipation, unspecified: Secondary | ICD-10-CM

## 2020-07-31 DIAGNOSIS — B829 Intestinal parasitism, unspecified: Secondary | ICD-10-CM | POA: Diagnosis not present

## 2020-07-31 NOTE — Progress Notes (Signed)
Subjective:    Patient ID: Alejandra Frederick, female    DOB: 01-19-1971, 50 y.o.   MRN: [id number]  Alejandra Frederick is a 50 y.o. female presenting on 07/31/2020 for GI Problem  Accompanied by husband Tamalyn Wadsworth  HPI  Patient presents for a same day appointment.  Abdominal Distention / Bloating / Constipation Possible Intestinal Parasites  Reports persistent lower abdominal bloating and discomfort has now worsened and involving upper abdomen as well. She has had history of some diarrhea in past but now has not had BM in 3+ days worsening her symptoms, with abdominal distention and bloating. She was given medication recommendation for Miralax laxative by GI, seen yesterday 07/30/20 in their office and arranged outpatient colonoscopy in July 2022. She ordered a RUQ Ultrasound abdominal imaging test as well at that time.  She expresses concern with possible intestinal parasite with worms and she has researched this possible problem, she describes eating undercooked meats prepared by her husband, see prior notes for details on this history. He is unaffected. She is feeling constriction from the worms or whatever is causing this in her abdomen, her chest and lungs and legs as well.  She has described seeing the worms in her stool. She is requesting treatment and testing for parasite  No flowsheet data found.  Social History   Tobacco Use   Smoking status: Some Days    Packs/day: 0.25    Pack years: 0.00    Types: Cigarettes   Smokeless tobacco: Never  Vaping Use   Vaping Use: Never used  Substance Use Topics   Alcohol use: No   Drug use: Not Currently    Comment: Abstained since age 63    Review of Systems Per HPI unless specifically indicated above     Objective:    BP (!) 160/80   Ht '5\' 4"'  (1.626 m)   Wt 174 lb (78.9 kg)   LMP 09/11/2018 (Exact Date)   BMI 29.87 kg/m   Wt Readings from Last 3 Encounters:  07/31/20 174 lb (78.9 kg)  07/30/20 174 lb 6 oz (79.1 kg)   07/24/20 180 lb 3.2 oz (81.7 kg)    Physical Exam Vitals and nursing note reviewed.  Constitutional:      General: She is not in acute distress.    Appearance: Normal appearance. She is well-developed. She is not diaphoretic.     Comments: Well-appearing, comfortable, cooperative  HENT:     Head: Normocephalic and atraumatic.  Eyes:     General:        Right eye: No discharge.        Left eye: No discharge.     Conjunctiva/sclera: Conjunctivae normal.  Cardiovascular:     Rate and Rhythm: Normal rate.  Pulmonary:     Effort: Pulmonary effort is normal.  Abdominal:     General: There is distension.     Palpations: There is no mass.     Tenderness: There is abdominal tenderness. There is no rebound.     Comments: Reduced bowel sounds  Skin:    General: Skin is warm and dry.     Findings: No erythema or rash.  Neurological:     Mental Status: She is alert and oriented to person, place, and time.  Psychiatric:        Mood and Affect: Mood normal.        Behavior: Behavior normal.        Thought Content: Thought content normal.     Comments:  Well groomed, good eye contact, normal speech and thoughts   Results for orders placed or performed in visit on 07/24/20  Urine Culture   Specimen: Urine  Result Value Ref Range   MICRO NUMBER: [id number]    SPECIMEN QUALITY: Adequate    Sample Source URINE    STATUS: FINAL    ISOLATE 1: Escherichia coli (A)       Susceptibility   Escherichia coli - URINE CULTURE, REFLEX    AMOX/CLAVULANIC 4 Sensitive     AMPICILLIN 8 Sensitive     AMPICILLIN/SULBACTAM 4 Sensitive     CEFAZOLIN* <=4 Not Reportable      * For infections other than uncomplicated UTI caused by E. coli, K. pneumoniae or P. mirabilis: Cefazolin is resistant if MIC > or = 8 mcg/mL. (Distinguishing susceptible versus intermediate for isolates with MIC < or = 4 mcg/mL requires additional testing.) For uncomplicated UTI caused by E. coli, K. pneumoniae or P. mirabilis:  Cefazolin is susceptible if MIC <32 mcg/mL and predicts susceptible to the oral agents cefaclor, cefdinir, cefpodoxime, cefprozil, cefuroxime, cephalexin and loracarbef.     CEFTAZIDIME <=1 Sensitive     CEFEPIME <=1 Sensitive     CEFTRIAXONE <=1 Sensitive     CIPROFLOXACIN <=0.25 Sensitive     LEVOFLOXACIN <=0.12 Sensitive     GENTAMICIN <=1 Sensitive     IMIPENEM <=0.25 Sensitive     NITROFURANTOIN 32 Sensitive     PIP/TAZO <=4 Sensitive     TOBRAMYCIN <=1 Sensitive     TRIMETH/SULFA* <=20 Sensitive      * For infections other than uncomplicated UTI caused by E. coli, K. pneumoniae or P. mirabilis: Cefazolin is resistant if MIC > or = 8 mcg/mL. (Distinguishing susceptible versus intermediate for isolates with MIC < or = 4 mcg/mL requires additional testing.) For uncomplicated UTI caused by E. coli, K. pneumoniae or P. mirabilis: Cefazolin is susceptible if MIC <32 mcg/mL and predicts susceptible to the oral agents cefaclor, cefdinir, cefpodoxime, cefprozil, cefuroxime, cephalexin and loracarbef. Legend: S = Susceptible  I = Intermediate R = Resistant  NS = Not susceptible * = Not tested  NR = Not reported **NN = See antimicrobic comments   Lipid panel  Result Value Ref Range   Cholesterol 118 <200 mg/dL   HDL 54 > OR = 50 mg/dL   Triglycerides 125 <150 mg/dL   LDL Cholesterol (Calc) 43 mg/dL (calc)   Total CHOL/HDL Ratio 2.2 <5.0 (calc)   Non-HDL Cholesterol (Calc) 64 <130 mg/dL (calc)  COMPLETE METABOLIC PANEL WITH GFR  Result Value Ref Range   Glucose, Bld 208 (H) 65 - 99 mg/dL   BUN 8 7 - 25 mg/dL   Creat 0.79 0.50 - 1.05 mg/dL   GFR, Est Non African American 87 > OR = 60 mL/min/1.23m   GFR, Est African American 101 > OR = 60 mL/min/1.760m  BUN/Creatinine Ratio NOT APPLICABLE 6 - 22 (calc)   Sodium 138 135 - 146 mmol/L   Potassium 4.3 3.5 - 5.3 mmol/L   Chloride 106 98 - 110 mmol/L   CO2 22 20 - 32 mmol/L   Calcium 9.2 8.6 - 10.4 mg/dL   Total Protein  7.1 6.1 - 8.1 g/dL   Albumin 4.4 3.6 - 5.1 g/dL   Globulin 2.7 1.9 - 3.7 g/dL (calc)   AG Ratio 1.6 1.0 - 2.5 (calc)   Total Bilirubin 0.5 0.2 - 1.2 mg/dL   Alkaline phosphatase (APISO) 116 37 - 153 U/L  AST 14 10 - 35 U/L   ALT 21 6 - 29 U/L  CBC with Differential/Platelet  Result Value Ref Range   WBC 8.9 3.8 - 10.8 Thousand/uL   RBC 4.63 3.80 - 5.10 Million/uL   Hemoglobin 13.5 11.7 - 15.5 g/dL   HCT 41.9 35.0 - 45.0 %   MCV 90.5 80.0 - 100.0 fL   MCH 29.2 27.0 - 33.0 pg   MCHC 32.2 32.0 - 36.0 g/dL   RDW 13.9 11.0 - 15.0 %   Platelets 255 140 - 400 Thousand/uL   MPV 9.9 7.5 - 12.5 fL   Neutro Abs 5,919 1,500 - 7,800 cells/uL   Lymphs Abs 2,448 850 - 3,900 cells/uL   Absolute Monocytes 401 200 - 950 cells/uL   Eosinophils Absolute 107 15 - 500 cells/uL   Basophils Absolute 27 0 - 200 cells/uL   Neutrophils Relative % 66.5 %   Total Lymphocyte 27.5 %   Monocytes Relative 4.5 %   Eosinophils Relative 1.2 %   Basophils Relative 0.3 %      Assessment & Plan:   Problem List Items Addressed This Visit   None Visit Diagnoses     Abdominal distention    -  Primary   Constipation, unspecified constipation type       Intestinal parasitism       Relevant Orders   Ova and parasite examination       Clinically with acute on chronic abdominal distention and abdominal pain, now worsening over past 2-3 weeks Last BM 3+ days ago She has seen [facility] Dr Marius Ditch yesterday 07/30/20, upcoming RUQ imaging and Colonoscopy planned, she has miralax regimen- without success so far  Discussion that regarding her initial concern with the possible parasitic worm infection, she would need further testing with stool ova & parasite test and possibly blood work for other parasitic infection if indicated.  She is unable to leave stool sample, and will take kit and bring back following the protocol for [place] to submit these when she can.  We discussed her concerns, and based on what she is reporting, I  am not entirely convinced that all of her symptoms are directly related to this. I am worried that there are other factors that can be involved such as Psychological cause and anxiety that can worsen the physical symptoms she is experiencing to fuel her concerns.  I spoke with Dr Marius Ditch on Epic Secure chat today, and she agreed that I could order the stool parasite test.  However, patient is experiencing too much pain with abdominal distention and bloating and constipation, she opts to go to [facility] ED today, and she decides to leave there now by personal vehicle.  I called [facility] ED and spoke with triage, they are aware of patient.  Orders Placed This Encounter  Procedures   Ova and parasite examination     No orders of the defined types were placed in this encounter.    Follow up plan: Return if symptoms worsen or fail to improve.   [name], [facility] 07/31/2020, 3:21 PM

## 2020-07-31 NOTE — Telephone Encounter (Signed)
Patient called and was scheduled for same day apt today. She was seen in office already. Please refer to office visit note documentation instead.  Alejandra Frederick, [facility] 07/31/2020, 5:18 PM

## 2020-07-31 NOTE — Telephone Encounter (Signed)
Patient called and was scheduled for same day apt today. She was seen in office already. Please refer to office visit note documentation instead.  [name], [facility] 07/31/2020, 5:18 PM

## 2020-07-31 NOTE — Telephone Encounter (Signed)
Reason for Disposition  Passed a "worm" by rectum  Answer Assessment - Initial Assessment Questions 1. APPEARANCE of WORM: "What does it look like and does it move?"     Worm in stool and looks white long , round 2. SIZE: "How long is the worm?"     Long twisted around stool 3. LOCATION: "Where did it come from?" (e.g., from rectum, in stool, through mouth)     In stool  from rectum. 4. WHEN: "When did it happen?"     Today  5. OTHER SYMPTOMS: "Do you have any other symptoms?" (e.g., stomach pain, vomiting)     Stomach pain , cramps, chills, sweating  6. PREGNANCY: "Is there any chance you are pregnant?" "When was your last menstrual period?"     na 7. TRAVEL: "Have you traveled internationally in the last 2 to 3 months?"     na  Protocols used: Worms - Other Than Pinworms-A-AH

## 2020-07-31 NOTE — Patient Instructions (Signed)
° °  Please schedule a Follow-up Appointment to: No follow-ups on file. ° °If you have any other questions or concerns, please feel free to call the office or send a message through MyChart. You may also schedule an earlier appointment if necessary. ° °Additionally, you may be receiving a survey about your experience at our office within a few days to 1 week by e-mail or mail. We value your feedback. ° °Teauna Dubach, DO °[facility] °

## 2020-07-31 NOTE — Telephone Encounter (Signed)
C/o feeling "bad" due to intestinal worms. C/o chills , sweating, body aches and noted long, white, round worms in loose stool. Patient reports worms noted this am and she is unable to sleep. C/o chest tightness and anxiety due to feeling like worms are in her body. Denies shortness of breath, fever, or vomiting. Patient reports she has a Colonoscopy scheduled in July. Patient wants to see PCP. Appt scheduled for today 07/31/20 with PCP. Care advise given. Patient verbalized understanding of care advise and to call back or go to [facility] or ED if symptoms worsen.
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
## 2020-08-01 NOTE — Patient Instructions (Signed)
Visit Information   Goals Addressed             This Visit's Progress    Managing Mental Health Conditions and Obtaining Supportive Resources   On track    Patient Goals/Self-Care Activities: Over the next 120 days - barriers to treatment adherence identified - counseling provided - emotional liability acknowledged and normalized - participation in mental health treatment encouraged - self-awareness of emotional triggers encouraged - strategies to manage emotional triggers promoted - begin personal counseling - join a support group - practice positive thinking and self-talk         Patient verbalizes understanding of instructions provided today and agrees to view in [place].   Telephone follow up appointment with care management team member scheduled for:08/06/20  Christa See, MSW, [email or web address] Phone [phone] 11:15 AM

## 2020-08-06 ENCOUNTER — Ambulatory Visit: Payer: Medicare Other | Admitting: Licensed Clinical Social Worker

## 2020-08-06 DIAGNOSIS — E1140 Type 2 diabetes mellitus with diabetic neuropathy, unspecified: Secondary | ICD-10-CM

## 2020-08-06 DIAGNOSIS — F3160 Bipolar disorder, current episode mixed, unspecified: Secondary | ICD-10-CM

## 2020-08-06 DIAGNOSIS — I10 Essential (primary) hypertension: Secondary | ICD-10-CM

## 2020-08-07 NOTE — Patient Instructions (Signed)
Visit Information   Goals Addressed             This Visit's Progress    Managing Mental Health Conditions and Obtaining Supportive Resources   On track    Patient Goals/Self-Care Activities: Over the next 120 days - barriers to treatment adherence identified - counseling provided - emotional liability acknowledged and normalized - participation in mental health treatment encouraged - self-awareness of emotional triggers encouraged - strategies to manage emotional triggers promoted - begin personal counseling - join a support group - practice positive thinking and self-talk         Patient verbalizes understanding of instructions provided today and agrees to view in [place].   Telephone follow up appointment with care management team member scheduled for:08/27/20  Christa See, MSW, [email or web address] Phone [phone] 4:23 PM
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
## 2020-08-21 ENCOUNTER — Other Ambulatory Visit: Payer: Self-pay

## 2020-08-21 ENCOUNTER — Ambulatory Visit
Admission: RE | Admit: 2020-08-21 | Discharge: 2020-08-21 | Disposition: A | Discharge status: Home / Self Care | Payer: Medicare Other | Source: Ambulatory Visit | Attending: Gastroenterology | Admitting: Gastroenterology

## 2020-08-21 ENCOUNTER — Other Ambulatory Visit: Payer: Self-pay | Admitting: Family Medicine

## 2020-08-21 ENCOUNTER — Encounter: Payer: Self-pay | Admitting: Gastroenterology

## 2020-08-21 DIAGNOSIS — K921 Melena: Secondary | ICD-10-CM

## 2020-08-21 DIAGNOSIS — K279 Peptic ulcer, site unspecified, unspecified as acute or chronic, without hemorrhage or perforation: Secondary | ICD-10-CM

## 2020-08-21 DIAGNOSIS — R1011 Right upper quadrant pain: Secondary | ICD-10-CM | POA: Insufficient documentation

## 2020-08-22 ENCOUNTER — Ambulatory Visit: Payer: Medicare Other | Admitting: Anesthesiology

## 2020-08-22 ENCOUNTER — Other Ambulatory Visit: Payer: Self-pay

## 2020-08-22 ENCOUNTER — Telehealth: Payer: Self-pay

## 2020-08-22 ENCOUNTER — Ambulatory Visit
Admission: RE | Admit: 2020-08-22 | Discharge: 2020-08-22 | Disposition: A | Discharge status: Home / Self Care | Payer: Medicare Other | Attending: Gastroenterology | Admitting: Gastroenterology

## 2020-08-22 ENCOUNTER — Encounter: Payer: Self-pay | Admitting: Gastroenterology

## 2020-08-22 ENCOUNTER — Encounter
Admission: RE | Disposition: A | Discharge status: Home / Self Care | Payer: Self-pay | Source: Home / Self Care | Attending: Gastroenterology

## 2020-08-22 DIAGNOSIS — Z794 Long term (current) use of insulin: Secondary | ICD-10-CM | POA: Diagnosis not present

## 2020-08-22 DIAGNOSIS — Z7951 Long term (current) use of inhaled steroids: Secondary | ICD-10-CM | POA: Insufficient documentation

## 2020-08-22 DIAGNOSIS — Z8541 Personal history of malignant neoplasm of cervix uteri: Secondary | ICD-10-CM | POA: Insufficient documentation

## 2020-08-22 DIAGNOSIS — Z8673 Personal history of transient ischemic attack (TIA), and cerebral infarction without residual deficits: Secondary | ICD-10-CM | POA: Diagnosis not present

## 2020-08-22 DIAGNOSIS — Z91041 Radiographic dye allergy status: Secondary | ICD-10-CM | POA: Diagnosis not present

## 2020-08-22 DIAGNOSIS — Z86711 Personal history of pulmonary embolism: Secondary | ICD-10-CM | POA: Insufficient documentation

## 2020-08-22 DIAGNOSIS — Z888 Allergy status to other drugs, medicaments and biological substances status: Secondary | ICD-10-CM | POA: Insufficient documentation

## 2020-08-22 DIAGNOSIS — Z79899 Other long term (current) drug therapy: Secondary | ICD-10-CM | POA: Insufficient documentation

## 2020-08-22 DIAGNOSIS — Z1211 Encounter for screening for malignant neoplasm of colon: Secondary | ICD-10-CM | POA: Insufficient documentation

## 2020-08-22 DIAGNOSIS — Z91030 Bee allergy status: Secondary | ICD-10-CM | POA: Insufficient documentation

## 2020-08-22 DIAGNOSIS — F17200 Nicotine dependence, unspecified, uncomplicated: Secondary | ICD-10-CM | POA: Diagnosis not present

## 2020-08-22 DIAGNOSIS — Z881 Allergy status to other antibiotic agents status: Secondary | ICD-10-CM | POA: Diagnosis not present

## 2020-08-22 DIAGNOSIS — Z91011 Allergy to milk products: Secondary | ICD-10-CM | POA: Diagnosis not present

## 2020-08-22 HISTORY — PX: COLONOSCOPY WITH PROPOFOL: SHX5780

## 2020-08-22 LAB — GLUCOSE, CAPILLARY: Glucose-Capillary: 185 mg/dL — ABNORMAL HIGH (ref 70–99)

## 2020-08-22 SURGERY — COLONOSCOPY WITH PROPOFOL
Anesthesia: General

## 2020-08-22 MED ORDER — MIDAZOLAM HCL 2 MG/2ML IJ SOLN
INTRAMUSCULAR | Status: DC | PRN
  Administered 2020-08-22: 2 mg via INTRAVENOUS

## 2020-08-22 MED ORDER — SODIUM CHLORIDE 0.9 % IV SOLN: INTRAVENOUS | Status: DC

## 2020-08-22 MED ORDER — PROPOFOL 10 MG/ML IV BOLUS
INTRAVENOUS | Status: DC | PRN
  Administered 2020-08-22 (×6): 20 mg via INTRAVENOUS
  Administered 2020-08-22: 80 mg via INTRAVENOUS

## 2020-08-22 MED ORDER — PROPOFOL 10 MG/ML IV BOLUS
INTRAVENOUS | Status: AC
  Filled 2020-08-22: qty 20

## 2020-08-22 MED ORDER — MIDAZOLAM HCL 2 MG/2ML IJ SOLN
INTRAMUSCULAR | Status: AC
  Filled 2020-08-22: qty 2

## 2020-08-22 MED ORDER — FENTANYL CITRATE (PF) 100 MCG/2ML IJ SOLN
INTRAMUSCULAR | Status: DC | PRN
  Administered 2020-08-22 (×2): 50 ug via INTRAVENOUS

## 2020-08-22 MED ORDER — PHENYLEPHRINE HCL (PRESSORS) 10 MG/ML IV SOLN
INTRAVENOUS | Status: AC
  Filled 2020-08-22: qty 1

## 2020-08-22 MED ORDER — LINACLOTIDE 145 MCG PO CAPS: 145.0000 ug | ORAL_CAPSULE | Freq: Every day | ORAL | 0 refills | Status: DC

## 2020-08-22 MED ORDER — FENTANYL CITRATE (PF) 100 MCG/2ML IJ SOLN
INTRAMUSCULAR | Status: AC
  Filled 2020-08-22: qty 2

## 2020-08-22 NOTE — Op Note (Signed)
[facility] Gastroenterology Patient Name: Alejandra Frederick Procedure Date: 08/22/2020 7:26 AM MRN: [id number] Account #: [id number] Date of Birth: 01/27/1971 Admit Type: Outpatient Age: 50 Room: [facility] ENDO ROOM 4 Gender: Female Note Status: Finalized Procedure:             Colonoscopy Indications:           Screening for colorectal malignant neoplasm, This is                         the patient's first colonoscopy Providers:             Lin Landsman MD, MD Referring MD:          Olin Hauser (Referring MD) Medicines:             General Anesthesia Complications:         No immediate complications. Estimated blood loss: None. Procedure:             Pre-Anesthesia Assessment:                        - Prior to the procedure, a History and Physical was                         performed, and patient medications and allergies were                         reviewed. The patient is competent. The risks and                         benefits of the procedure and the sedation options and                         risks were discussed with the patient. All questions                         were answered and informed consent was obtained.                         Patient identification and proposed procedure were                         verified by the physician, the nurse, the                         anesthesiologist, the anesthetist and the technician                         in the pre-procedure area in the procedure room in the                         endoscopy suite. Mental Status Examination: alert and                         oriented. Airway Examination: normal oropharyngeal                         airway and neck mobility. Respiratory Examination:  clear to auscultation. CV Examination: normal.                         Prophylactic Antibiotics: The patient does not require                         prophylactic antibiotics. Prior  Anticoagulants: The                         patient has taken no previous anticoagulant or                         antiplatelet agents. ASA Grade Assessment: III - A                         patient with severe systemic disease. After reviewing                         the risks and benefits, the patient was deemed in                         satisfactory condition to undergo the procedure. The                         anesthesia plan was to use general anesthesia.                         Immediately prior to administration of medications,                         the patient was re-assessed for adequacy to receive                         sedatives. The heart rate, respiratory rate, oxygen                         saturations, blood pressure, adequacy of pulmonary                         ventilation, and response to care were monitored                         throughout the procedure. The physical status of the                         patient was re-assessed after the procedure.                        After obtaining informed consent, the colonoscope was                         passed under direct vision. Throughout the procedure,                         the patient's blood pressure, pulse, and oxygen                         saturations were monitored continuously. The  Colonoscope was introduced through the anus and                         advanced to the the cecum, identified by appendiceal                         orifice and ileocecal valve. The colonoscopy was                         unusually difficult due to inadequate bowel prep and                         significant looping. Successful completion of the                         procedure was aided by applying abdominal pressure.                         The patient tolerated the procedure well. The quality                         of the bowel preparation was evaluated using the BBPS                         [facility]  Bowel Preparation Scale) with scores of: Right                         Colon = 0 (unprepared, mucosa not seen due to solid                         stool that cannot be cleared or unseen proximal colon                         segment in a colonoscopy aborted due to inadequate                         bowel prep), Transverse Colon = 3 (entire mucosa seen                         well with no residual staining, small fragments of                         stool or opaque liquid) and Left Colon = 3 (entire                         mucosa seen well with no residual staining, small                         fragments of stool or opaque liquid). The total BBPS                         score equals 6. The quality of the bowel preparation                         was inadequate. Findings:      The perianal and digital rectal examinations were normal. Pertinent  negatives include normal sphincter tone and no palpable rectal lesions.      Extensive amounts of semi-solid stool was found in the ascending colon       and in the cecum, precluding visualization.      The retroflexed view of the distal rectum and anal verge was normal and       showed no anal or rectal abnormalities. Impression:            - Preparation of the colon was inadequate.                        - Stool in the ascending colon and in the cecum.                        - The distal rectum and anal verge are normal on                         retroflexion view.                        - No specimens collected. Recommendation:        - Discharge patient to home (with escort).                        - Resume previous diet today.                        - Continue present medications.                        - Repeat colonoscopy in 6 months for screening                         purposes with 2day prep. Procedure Code(s):     --- Professional ---                        C9470, Colorectal cancer screening; colonoscopy on                          individual not meeting criteria for high risk Diagnosis Code(s):     --- Professional ---                        Z12.11, Encounter for screening for malignant neoplasm                         of colon CPT copyright 2019 American Medical Association. All rights reserved. The codes documented in this report are preliminary and upon coder review may  be revised to meet current compliance requirements. Dr. Ulyess Mort Lin Landsman MD, MD 08/22/2020 8:36:22 AM This report has been signed electronically. Number of Addenda: 0 Note Initiated On: 08/22/2020 7:26 AM Scope Withdrawal Time: 0 hours 4 minutes 15 seconds  Total Procedure Duration: 0 hours 9 minutes 53 seconds  Estimated Blood Loss:  Estimated blood loss: none.      [facility]

## 2020-08-22 NOTE — Transfer of Care (Signed)
Immediate Anesthesia Transfer of Care Note  Patient: Alejandra Frederick  Procedure(s) Performed: COLONOSCOPY WITH PROPOFOL  Patient Location: PACU  Anesthesia Type:General  Level of Consciousness: awake, alert  and oriented  Airway & Oxygen Therapy: Patient Spontanous Breathing  Post-op Assessment: Report given to RN, Post -op Vital signs reviewed and stable and Patient moving all extremities  Post vital signs: Reviewed and stable  Last Vitals:  Vitals Value Taken Time  BP    Temp    Pulse 74 08/22/20 0837  Resp 15 08/22/20 0837  SpO2 100 % 08/22/20 0837  Vitals shown include unvalidated device data.  Last Pain:  Vitals:   08/22/20 0732  TempSrc: Temporal  PainSc: 0-No pain         Complications: No notable events documented.

## 2020-08-22 NOTE — Telephone Encounter (Signed)
-----   Message from Lin Landsman, MD sent at 08/22/2020  8:58 AM EDT ----- Regarding: Linzess Please send in linzess 122mcg daily for 30days Chronic idiopathic constipation  RV

## 2020-08-22 NOTE — Anesthesia Postprocedure Evaluation (Signed)
Anesthesia Post Note  Patient: Alejandra Frederick  Procedure(s) Performed: COLONOSCOPY WITH PROPOFOL  Patient location during evaluation: Phase II Anesthesia Type: General Level of consciousness: awake and alert, awake and oriented Pain management: pain level controlled Vital Signs Assessment: post-procedure vital signs reviewed and stable Respiratory status: spontaneous breathing, nonlabored ventilation and respiratory function stable Cardiovascular status: blood pressure returned to baseline and stable Postop Assessment: no apparent nausea or vomiting Anesthetic complications: no   No notable events documented.   Last Vitals:  Vitals:   08/22/20 0839 08/22/20 0857  BP: 127/61 (!) 144/77  Pulse: 73   Resp: 20   Temp:    SpO2: 100%     Last Pain:  Vitals:   08/22/20 0857  TempSrc:   PainSc: 0-No pain                 Phill Mutter

## 2020-08-22 NOTE — Telephone Encounter (Signed)
Sent medication to pharmacy.   
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
## 2020-08-22 NOTE — Anesthesia Preprocedure Evaluation (Addendum)
Anesthesia Evaluation  Patient identified by MRN, date of birth, ID band Patient awake    Reviewed: Allergy & Precautions, NPO status , Patient's Chart, lab work & pertinent test results, reviewed documented beta blocker date and time   History of Anesthesia Complications Negative for: history of anesthetic complications  Airway Mallampati: II  TM Distance: >3 FB Neck ROM: Full    Dental  (+) Edentulous Upper, Poor Dentition   Pulmonary asthma , neg sleep apnea, Current Smoker and Patient abstained from smoking.,    breath sounds clear to auscultation- rhonchi (-) wheezing      Cardiovascular hypertension, Pt. on medications and Pt. on home beta blockers + CAD and + Past MI  (-) Cardiac Stents and (-) CABG  Rhythm:Regular Rate:Normal - Systolic murmurs and - Diastolic murmurs    Neuro/Psych  Headaches, Seizures -, Well Controlled,  PSYCHIATRIC DISORDERS Anxiety Depression Bipolar Disorder CVA, No Residual Symptoms    GI/Hepatic Neg liver ROS, PUD,   Endo/Other  diabetes, Well Controlled, Insulin Dependent  Renal/GU Renal disease     Musculoskeletal negative musculoskeletal ROS (+)   Abdominal (+) - obese,   Peds  Hematology negative hematology ROS (+)   Anesthesia Other Findings Anxiety    Asthma    Cervical cancer ([place]) 2007 surgical resection to freeze cells. Complication of anesthesia  hard to wake up   Depression    Gastroparesis    Headache    High cholesterol    Hypertension    Myocardial infarction [facility]) 2016  Pulmonary embolus ([place])   Stroke [facility]) 2013      Reproductive/Obstetrics                            Anesthesia Physical  Anesthesia Plan  ASA: 3  Anesthesia Plan: General   Post-op Pain Management:    Induction: Intravenous  PONV Risk Score and Plan: 2 and TIVA and Propofol infusion  Airway Management Planned: Natural Airway and Nasal Cannula  Additional  Equipment:   Intra-op Plan:   Post-operative Plan:   Informed Consent: I have reviewed the patients History and Physical, chart, labs and discussed the procedure including the risks, benefits and alternatives for the proposed anesthesia with the patient or authorized representative who has indicated his/her understanding and acceptance.     Dental advisory given  Plan Discussed with: CRNA and Anesthesiologist  Anesthesia Plan Comments:        Anesthesia Quick Evaluation

## 2020-08-23 ENCOUNTER — Encounter: Payer: Self-pay | Admitting: Gastroenterology

## 2020-08-26 ENCOUNTER — Encounter: Payer: Self-pay | Admitting: Gastroenterology

## 2020-08-27 ENCOUNTER — Ambulatory Visit (INDEPENDENT_AMBULATORY_CARE_PROVIDER_SITE_OTHER): Payer: Medicare Other | Admitting: Licensed Clinical Social Worker

## 2020-08-27 DIAGNOSIS — F3160 Bipolar disorder, current episode mixed, unspecified: Secondary | ICD-10-CM

## 2020-08-27 DIAGNOSIS — F331 Major depressive disorder, recurrent, moderate: Secondary | ICD-10-CM | POA: Diagnosis not present

## 2020-08-27 DIAGNOSIS — I10 Essential (primary) hypertension: Secondary | ICD-10-CM | POA: Diagnosis not present

## 2020-08-27 DIAGNOSIS — E1140 Type 2 diabetes mellitus with diabetic neuropathy, unspecified: Secondary | ICD-10-CM | POA: Diagnosis not present

## 2020-08-29 ENCOUNTER — Ambulatory Visit (INDEPENDENT_AMBULATORY_CARE_PROVIDER_SITE_OTHER): Payer: Medicare Other | Admitting: Cardiology

## 2020-08-29 ENCOUNTER — Encounter: Payer: Self-pay | Admitting: Cardiology

## 2020-08-29 ENCOUNTER — Other Ambulatory Visit: Payer: Self-pay

## 2020-08-29 VITALS — BP 118/72 | HR 62 | Ht 63.0 in | Wt 170.0 lb

## 2020-08-29 DIAGNOSIS — E7800 Pure hypercholesterolemia, unspecified: Secondary | ICD-10-CM

## 2020-08-29 DIAGNOSIS — F17200 Nicotine dependence, unspecified, uncomplicated: Secondary | ICD-10-CM

## 2020-08-29 DIAGNOSIS — R000 Tachycardia, unspecified: Secondary | ICD-10-CM

## 2020-08-29 DIAGNOSIS — I10 Essential (primary) hypertension: Secondary | ICD-10-CM | POA: Diagnosis not present

## 2020-08-29 NOTE — Progress Notes (Signed)
Cardiology Office Note:    Date:  08/29/2020   ID:  Alejandra Frederick, DOB [birth year], MRN [id number]  PCP:  Olin Hauser, DO   [facility] Prov[facility] Cardiologist:  Kate Sable, MD     Referring MD: [name] *   Chief Complaint  Patient presents with   New Patient (Initial Visit)    Referred by PCP for Tachycardia. Meds reviewed verbally with patient.     History of Present Illness:    Alejandra Frederick is a 50 y.o. female with a hx of anxiety, hypertension, hyperlipidemia, current smoker x20+ years, who presents due to elevated heart rates.  Patient has noticed elevated heart rates up to 140s over the past several weeks.  Symptoms typically occur when she is stressed, anxious.  She has a history of anxiety, is taking anxiety medications, hopefully trying to decrease amount of medications she is taking.  She would not want to add any additional pills.  She feels well, denies dizziness, chest pain, shortness of breath, syncope.  She is working on quitting smoking.  Has no other concerns at this time.  Past Medical History:  Diagnosis Date   Anxiety    Asthma    Cervical cancer ([place]) 2007   surgical resection to freeze cells.    Complication of anesthesia    hard to wake up    Depression    Gastroparesis    Headache    High cholesterol    Hypertension    Myocardial infarction [facility]) 2016   Pulmonary embolus ([place])    Stroke ([place]) 2013    Past Surgical History:  Procedure Laterality Date   COLONOSCOPY WITH PROPOFOL N/A 08/22/2020   Procedure: COLONOSCOPY WITH PROPOFOL;  Surgeon: Lin Landsman, MD;  Location: [facility] ENDOSCOPY;  Service: Gastroenterology;  Laterality: N/A;   CYSTOCELE REPAIR N/A 10/03/2018   Procedure: Anterior Colporrhaphy and Culdoplasty;  Surgeon: Ward, Honor Loh, MD;  Location: [facility] ORS;  Service: Gynecology;  Laterality: N/A;   LAPAROSCOPIC HYSTERECTOMY N/A 10/03/2018   Procedure: HYSTERECTOMY TOTAL LAPAROSCOPIC, bilateral  Salpingectomy;  Surgeon: Ward, Honor Loh, MD;  Location: [facility] ORS;  Service: Gynecology;  Laterality: N/A;   TONSILLECTOMY     TUBAL LIGATION      Current Medications: Current Meds  Medication Sig   ADVAIR DISKUS 250-50 MCG/DOSE AEPB INHALE 1 PUFF INTO THE LUNGS TWICE DAILY   albuterol (VENTOLIN HFA) 108 (90 Base) MCG/ACT inhaler INHALE 1 TO 2 PUFFS INTO THE LUNGS EVERY 6 HOURS AS NEEDED FOR WHEEZING OR SHORTNESS OF BREATH   atorvastatin (LIPITOR) 40 MG tablet Take 1 tablet (40 mg total) by mouth daily.   cloNIDine (CATAPRES) 0.3 MG tablet Take 0.3 mg by mouth 2 (two) times daily.   Continuous Blood Gluc Sensor (DEXCOM G6 SENSOR) MISC Inject 1 Device into the skin as directed.   Continuous Blood Gluc Transmit (DEXCOM G6 TRANSMITTER) MISC Inject 1 Device into the skin as directed.   cyclobenzaprine (FLEXERIL) 10 MG tablet Take 1 tablet (10 mg total) by mouth daily as needed for muscle spasms.   diphenhydrAMINE-Zinc Acetate (ANTI-ITCH EXTRA STRENGTH EX) Apply 1 application topically 4 (four) times daily as needed (itching.).   divalproex (DEPAKOTE) 125 MG DR tablet Take 125 mg by mouth 2 (two) times daily.   fluconazole (DIFLUCAN) 150 MG tablet Take one tablet by mouth on Day 1. Repeat dose 2nd tablet on Day 3.   FLUoxetine (PROZAC) 20 MG capsule Take by mouth.   glucose blood (ONETOUCH VERIO) test strip 1  each by Other route in the morning, at noon, in the evening, and at bedtime. Use as instructed   insulin aspart (NOVOLOG FLEXPEN) 100 UNIT/ML FlexPen Max daily 24 units   Insulin Pen Needle 32G X 4 MM MISC 1 Device by Does not apply route in the morning, at noon, in the evening, and at bedtime.   ketorolac (TORADOL) 30 MG/ML injection Inject 30 mg into the muscle daily as needed (migraine headaches.).   Lancets (ONETOUCH DELICA PLUS [id number]) MISC 1 Device by Does not apply route as directed.   linaclotide (LINZESS) 145 MCG CAPS capsule Take 1 capsule (145 mcg total) by mouth daily before  breakfast.   losartan (COZAAR) 50 MG tablet Take by mouth.   metoprolol succinate (TOPROL-XL) 50 MG 24 hr tablet Take 1.5 tablets (75 mg total) by mouth daily. Take with or immediately following a meal.   naphazoline-pheniramine (VISINE-A) 0.025-0.3 % ophthalmic solution Place 1 drop into both eyes 4 (four) times daily as needed for eye irritation.   prazosin (MINIPRESS) 5 MG capsule Take 10 mg by mouth at bedtime.     Allergies:   Bee venom, Ciprofloxacin, Iodinated diagnostic agents, Zofran [ondansetron hcl], Lactose intolerance (gi), and Olanzapine   Social History   Socioeconomic History   Marital status: Married    Spouse name: Not on file   Number of children: Not on file   Years of education: Not on file   Highest education level: Not on file  Occupational History   Not on file  Tobacco Use   Smoking status: Some Days    Packs/day: 0.25    Types: Cigarettes   Smokeless tobacco: Never  Vaping Use   Vaping Use: Never used  Substance and Sexual Activity   Alcohol use: No   Drug use: Not Currently    Comment: Abstained since age 37   Sexual activity: Not on file  Other Topics Concern   Not on file  Social History Narrative   Not on file   Social Determinants of Health   Financial Resource Strain: Not on file  Food Insecurity: Not on file  Transportation Needs: No Transportation Needs   Lack of Transportation (Medical): No   Lack of Transportation (Non-Medical): No  Physical Activity: Not on file  Stress: Stress Concern Present   Feeling of Stress : Very much  Social Connections: Socially Isolated   Frequency of Communication with Friends and Family: Never   Frequency of Social Gatherings with Friends and Family: Never   Attends Religious Services: Never   Marine scientist or Organizations: No   Attends Music therapist: Never   Marital Status: Married     Family History: The patient's family history includes Bipolar disorder in her father,  mother, and sister; Diabetes in her mother; Heart disease in her father; Multiple myeloma (age of onset: 51) in her sister; Multiple myeloma (age of onset: 78) in her mother; Prostate cancer in her paternal grandfather.  ROS:   Please see the history of present illness.     All other systems reviewed and are negative.  EKGs/Labs/Other Studies Reviewed:    The following studies were reviewed today:   EKG:  EKG is  ordered today.  The ekg ordered today demonstrates normal sinus rhythm, normal ECG  Recent Labs: 07/24/2020: ALT 21; BUN 8; Creat 0.79; Hemoglobin 13.5; Platelets 255; Potassium 4.3; Sodium 138  Recent Lipid Panel    Component Value Date/Time   CHOL 118 07/24/2020 1131  TRIG 125 07/24/2020 1131   HDL 54 07/24/2020 1131   CHOLHDL 2.2 07/24/2020 1131   LDLCALC 43 07/24/2020 1131     Risk Assessment/Calculations:          Physical Exam:    VS:  BP 118/72 (BP Location: Left Arm, Patient Position: Sitting, Cuff Size: Normal)   Pulse 62   Ht '5\' 3"'  (1.6 m)   Wt 170 lb (77.1 kg)   LMP 09/11/2018 (Exact Date)   SpO2 98%   BMI 30.11 kg/m     Wt Readings from Last 3 Encounters:  08/29/20 170 lb (77.1 kg)  08/22/20 165 lb (74.8 kg)  07/31/20 174 lb (78.9 kg)     GEN:  Well nourished, well developed in no acute distress HEENT: Normal NECK: No JVD; No carotid bruits LYMPHATICS: No lymphadenopathy CARDIAC: RRR, no murmurs, rubs, gallops RESPIRATORY:  Clear to auscultation without rales, wheezing or rhonchi  ABDOMEN: Soft, non-tender, non-distended MUSCULOSKELETAL:  No edema; No deformity  SKIN: Warm and dry NEUROLOGIC:  Alert and oriented x 3 PSYCHIATRIC:  Normal affect   ASSESSMENT:    1. Tachycardia   2. Primary hypertension   3. Pure hypercholesterolemia   4. Smoking    PLAN:    In order of problems listed above:  Tachycardia associated with anxiety and stress.  Last EKG reviewed in the chart showing sinus tachycardia, heart rate 137.  Patient  counseled regarding sinus tach being physiologic/normal response due to anxiety and stress.  Encouraged to work on stressors, better management of anxiety with PCP.  She was reassured.  No additional cardiac testing indicated at this time. Hypertension, BP controlled.  Continue losartan. Hyperlipidemia, cholesterol controlled, continue statin. Current smoker, cessation advised, over 5 minutes spent counseling patient.  Follow-up as needed     Medication Adjustments/Labs and Tests Ordered: Current medicines are reviewed at length with the patient today.  Concerns regarding medicines are outlined above.  Orders Placed This Encounter  Procedures   EKG 12-Lead    No orders of the defined types were placed in this encounter.   Patient Instructions  Medication Instructions:   Your physician recommends that you continue on your current medications as directed. Please refer to the Current Medication list given to you today.   *If you need a refill on your cardiac medications before your next appointment, please call your pharmacy*   Lab Work:  None Ordered   Testing/Procedures: None ordered   Follow-Up: At [facility], you and your health needs are our priority.  As part of our continuing mission to provide you with exceptional heart care, we have created designated Provider Care Teams.  These Care Teams include your primary Cardiologist (physician) and Advanced Practice Providers (APPs -  Physician Assistants and Nurse Practitioners) who all work together to provide you with the care you need, when you need it.  We recommend signing up for the patient portal called "MyChart".  Sign up information is provided on this After Visit Summary.  MyChart is used to connect with patients for Virtual Visits (Telemedicine).  Patients are able to view lab/test results, encounter notes, upcoming appointments, etc.  Non-urgent messages can be sent to your provider as well.   To learn more about  what you can do with MyChart, go to [email or web address].    Your next appointment:   Follow up as needed   The format for your next appointment:   In Person  Provider:   Kate Sable, MD   Other Instructions  Signed, Kate Sable, MD  08/29/2020 1:25 PM    [facility]

## 2020-08-29 NOTE — Patient Instructions (Signed)
Visit Information   Goals Addressed             This Visit's Progress    Managing Mental Health Conditions and Obtaining Supportive Resources   On track    Patient Goals/Self-Care Activities: Over the next 120 days - barriers to treatment adherence identified - counseling provided - emotional liability acknowledged and normalized - participation in mental health treatment encouraged - self-awareness of emotional triggers encouraged - strategies to manage emotional triggers promoted - begin personal counseling - join a support group - practice positive thinking and self-talk        Patient verbalizes understanding of instructions provided today and agrees to view in [address].   Telephone follow up appointment with care management team member scheduled for:09/17/20  Christa See, MSW, [email or web address] Phone [phone] 9:48 AM
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
## 2020-08-29 NOTE — Patient Instructions (Signed)
Medication Instructions:   Your physician recommends that you continue on your current medications as directed. Please refer to the Current Medication list given to you today.   *If you need a refill on your cardiac medications before your next appointment, please call your pharmacy*   Lab Work:  None Ordered   Testing/Procedures: None ordered   Follow-Up: At [facility], you and your health needs are our priority.  As part of our continuing mission to provide you with exceptional heart care, we have created designated Provider Care Teams.  These Care Teams include your primary Cardiologist (physician) and Advanced Practice Providers (APPs -  Physician Assistants and Nurse Practitioners) who all work together to provide you with the care you need, when you need it.  We recommend signing up for the patient portal called "MyChart".  Sign up information is provided on this After Visit Summary.  MyChart is used to connect with patients for Virtual Visits (Telemedicine).  Patients are able to view lab/test results, encounter notes, upcoming appointments, etc.  Non-urgent messages can be sent to your provider as well.   To learn more about what you can do with MyChart, go to [email or web address].    Your next appointment:   Follow up as needed   The format for your next appointment:   In Person  Provider:   Kate Sable, MD   Other Instructions

## 2020-09-17 ENCOUNTER — Ambulatory Visit (INDEPENDENT_AMBULATORY_CARE_PROVIDER_SITE_OTHER): Payer: Medicare Other | Admitting: Licensed Clinical Social Worker

## 2020-09-17 DIAGNOSIS — I10 Essential (primary) hypertension: Secondary | ICD-10-CM

## 2020-09-17 DIAGNOSIS — F3160 Bipolar disorder, current episode mixed, unspecified: Secondary | ICD-10-CM

## 2020-09-17 DIAGNOSIS — E1140 Type 2 diabetes mellitus with diabetic neuropathy, unspecified: Secondary | ICD-10-CM

## 2020-09-17 DIAGNOSIS — F603 Borderline personality disorder: Secondary | ICD-10-CM

## 2020-09-18 ENCOUNTER — Other Ambulatory Visit: Payer: Self-pay | Admitting: Gastroenterology

## 2020-09-19 ENCOUNTER — Encounter: Payer: Self-pay | Admitting: Family Medicine

## 2020-09-19 ENCOUNTER — Other Ambulatory Visit: Payer: Self-pay | Admitting: Family Medicine

## 2020-09-19 DIAGNOSIS — I10 Essential (primary) hypertension: Secondary | ICD-10-CM

## 2020-09-19 MED ORDER — CLONIDINE HCL 0.3 MG PO TABS: 0.3000 mg | ORAL_TABLET | Freq: Two times a day (BID) | ORAL | 3 refills | Status: AC
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
## 2020-09-19 NOTE — Patient Instructions (Signed)
Visit Information   Goals Addressed             This Visit's Progress    Managing Mental Health Conditions and Obtaining Supportive Resources   On track    Patient Goals/Self-Care Activities: Over the next 120 days - barriers to treatment adherence identified - counseling provided - emotional liability acknowledged and normalized - participation in mental health treatment encouraged - self-awareness of emotional triggers encouraged - strategies to manage emotional triggers promoted - begin personal counseling - join a support group - practice positive thinking and self-talk        Patient verbalizes understanding of instructions provided today and agrees to view in [place].   Telephone follow up appointment with care management team member scheduled for:10/08/20  Christa See, MSW, [email or web address] Phone [phone] 10:59 AM

## 2020-09-27 ENCOUNTER — Other Ambulatory Visit: Payer: Self-pay

## 2020-09-27 ENCOUNTER — Ambulatory Visit (INDEPENDENT_AMBULATORY_CARE_PROVIDER_SITE_OTHER): Payer: Medicare Other | Admitting: Internal Medicine

## 2020-09-27 ENCOUNTER — Encounter: Payer: Self-pay | Admitting: Internal Medicine

## 2020-09-27 VITALS — BP 120/80 | HR 74 | Ht 63.0 in | Wt 176.8 lb

## 2020-09-27 DIAGNOSIS — E781 Pure hyperglyceridemia: Secondary | ICD-10-CM

## 2020-09-27 DIAGNOSIS — E1140 Type 2 diabetes mellitus with diabetic neuropathy, unspecified: Secondary | ICD-10-CM

## 2020-09-27 DIAGNOSIS — Z794 Long term (current) use of insulin: Secondary | ICD-10-CM | POA: Diagnosis not present

## 2020-09-27 DIAGNOSIS — E1165 Type 2 diabetes mellitus with hyperglycemia: Secondary | ICD-10-CM | POA: Diagnosis not present

## 2020-09-27 LAB — POCT GLYCOSYLATED HEMOGLOBIN (HGB A1C): Hemoglobin A1C: 8.8 % — AB (ref 4.0–5.6)

## 2020-09-27 MED ORDER — INPEN 100-PINK-NOVOLOG-FIASP DEVI: 1.0000 | Freq: Once | 0 refills | Status: DC

## 2020-09-27 MED ORDER — DEXCOM G6 TRANSMITTER MISC: 1.0000 | 3 refills | Status: DC

## 2020-09-27 MED ORDER — INSULIN ASPART 100 UNIT/ML CARTRIDGE (PENFILL): SUBCUTANEOUS | 11 refills | Status: DC

## 2020-09-27 MED ORDER — DEXCOM G6 SENSOR MISC: 1.0000 | 3 refills | Status: DC

## 2020-09-27 NOTE — Patient Instructions (Addendum)
-   Keep up the Good Work  - Continue Tresiba 32 units daily  - Continue Farxiga, 10 mg , half a tablet daily  - Novolog correctional insulin:  Use the scale below to help guide you:   Blood sugar before meal Number of units to inject  Less than 200 0 unit  201 -  240 2 units  241 -  280 3 units  281 -  320 4 units  321 -  360 5 units  361 -  400 6 units  401 -  440 7 units  441-  480 8 units   HOW TO TREAT LOW BLOOD SUGARS (Blood sugar LESS THAN 70 MG/DL) Please follow the RULE OF 15 for the treatment of hypoglycemia treatment (when your (blood sugars are less than 70 mg/dL)   STEP 1: Take 15 grams of carbohydrates when your blood sugar is low, which includes:  3-4 GLUCOSE TABS  OR 3-4 OZ OF JUICE OR REGULAR SODA OR ONE TUBE OF GLUCOSE GEL    STEP 2: RECHECK blood sugar in 15 MINUTES STEP 3: If your blood sugar is still low at the 15 minute recheck --> then, go back to STEP 1 and treat AGAIN with another 15 grams of carbohydrates.

## 2020-09-27 NOTE — Progress Notes (Signed)
Name: Gage Treiber  Age/ Sex: 50 y.o., female   MRN/ DOB: [id number], [birth year]     PCP: Olin Hauser, DO   Reason for Endocrinology Evaluation: Type 2 Diabetes Mellitus  Initial Endocrine Consultative Visit:  04/04/2020    PATIENT IDENTIFIER: Alejandra Frederick is a 50 y.o. female with a past medical history of T2DM, Bipolar disorder, HTN , and Dyslipidemia.. The patient has followed with Endocrinology clinic since 04/04/2020 for consultative assistance with management of her diabetes.  DIABETIC HISTORY:  Alejandra Frederick was diagnosed with DM at age 32, this started as gestational diabetes.  She is intolerant to metformin due to GI side effects, she has also been tried on Ozempic/Trulicity as well as Jardiance. Her hemoglobin A1c has ranged from 7.5% in 2018, peaking at 7.6% in 2020.  On her initial visit to our clinic she had an A1c of 8.5%, she was on basal insulin as well as using NovoLog per sliding scale, we provided her with a correction scale, continue [place] and started her on Farxiga. SUBJECTIVE:   During the last visit (07/01/2020): A1c 9.4% Increased tresiba, and Farxiga and continued Novolog per Correction scale      Today (09/27/2020): Alejandra Frederick is here for a follow up on diabetes management.  She checks her blood sugars multiple  times through CGM. The patient has not had hypoglycemic episodes since the last clinic visit.   S/P colonoscopy 08/2020  Was recently seen by PCP for GI discomfort and concerns about parasitic infection   Linzess has helped with abdominal pain  Quit smoking a month ago     HOME DIABETES REGIMEN:  Tresiba 32 units daily CF : NovoLog ( Bg-140/30) Farxiga 10 mg daily - half a tablet     Statin: yes ACE-I/ARB: yes     CONTINUOUS GLUCOSE MONITORING RECORD INTERPRETATION    Dates of Recording: 8/6- 09/27/2020  Sensor description:dexcom  Results statistics:   CGM use % of time 93  Average and SD 205/48  Time in range    32   %  % Time Above 180 51  % Time above 250 17  % Time Below target 0      Glycemic patterns summary: hyperglycemia during the day and night but worse at night   Hyperglycemic episodes  During the day and night but worse at night   Hypoglycemic episodes occurred N/A  Overnight periods: high      DIABETIC COMPLICATIONS: Microvascular complications:  CKD III Denies: retinopathy, neuropathy Last Eye Exam: Completed 2021  Macrovascular complications:   Denies: CAD, CVA, PVD   HISTORY:  Past Medical History:  Past Medical History:  Diagnosis Date   Anxiety    Asthma    Cervical cancer ([place]) 2007   surgical resection to freeze cells.    Complication of anesthesia    hard to wake up    Depression    Gastroparesis    Headache    High cholesterol    Hypertension    Myocardial infarction [facility]) 2016   Pulmonary embolus ([place])    Stroke ([place]) 2013   Past Surgical History:  Past Surgical History:  Procedure Laterality Date   COLONOSCOPY WITH PROPOFOL N/A 08/22/2020   Procedure: COLONOSCOPY WITH PROPOFOL;  Surgeon: Lin Landsman, MD;  Location: [facility] ENDOSCOPY;  Service: Gastroenterology;  Laterality: N/A;   CYSTOCELE REPAIR N/A 10/03/2018   Procedure: Anterior Colporrhaphy and Culdoplasty;  Surgeon: Ward, Honor Loh, MD;  Location: [facility] ORS;  Service: Gynecology;  Laterality: N/A;  LAPAROSCOPIC HYSTERECTOMY N/A 10/03/2018   Procedure: HYSTERECTOMY TOTAL LAPAROSCOPIC, bilateral Salpingectomy;  Surgeon: Ward, Honor Loh, MD;  Location: [facility] ORS;  Service: Gynecology;  Laterality: N/A;   TONSILLECTOMY     TUBAL LIGATION     Social History:  reports that she has been smoking cigarettes. She has been smoking an average of .25 packs per day. She has never used smokeless tobacco. She reports that she does not currently use drugs. She reports that she does not drink alcohol. Family History:  Family History  Problem Relation Age of Onset   Multiple myeloma Mother 47    Bipolar disorder Mother    Diabetes Mother    Heart disease Father    Bipolar disorder Father    Multiple myeloma Sister 29   Bipolar disorder Sister    Prostate cancer Paternal Grandfather      HOME MEDICATIONS: Allergies as of 09/27/2020       Reactions   Bee Venom Anaphylaxis   Ciprofloxacin Anaphylaxis   Iodinated Diagnostic Agents Swelling   Facial swelling  Fingers red and swelling  Facial swelling  Fingers red and swelling    Zofran [ondansetron Hcl] Anaphylaxis   Swelling of the throat and redness of the face   Lactose Intolerance (gi) Diarrhea   Upset stomach   Olanzapine Nausea Only, Other (See Comments)   Seizure like activity Seizure like activity        Medication List        Accurate as of September 27, 2020 10:50 AM. If you have any questions, ask your nurse or doctor.          Advair Diskus 250-50 MCG/ACT Aepb Generic drug: fluticasone-salmeterol INHALE 1 PUFF INTO THE LUNGS TWICE DAILY   albuterol 108 (90 Base) MCG/ACT inhaler Commonly known as: VENTOLIN HFA INHALE 1 TO 2 PUFFS INTO THE LUNGS EVERY 6 HOURS AS NEEDED FOR WHEEZING OR SHORTNESS OF BREATH   ANTI-ITCH EXTRA STRENGTH EX Apply 1 application topically 4 (four) times daily as needed (itching.).   atorvastatin 40 MG tablet Commonly known as: LIPITOR Take 1 tablet (40 mg total) by mouth daily.   busPIRone 7.5 MG tablet Commonly known as: BUSPAR Take by mouth.   cephALEXin 500 MG capsule Commonly known as: KEFLEX Take 1 capsule (500 mg total) by mouth 3 (three) times daily. For 7 days   cloNIDine 0.3 MG tablet Commonly known as: CATAPRES Take 1 tablet (0.3 mg total) by mouth 2 (two) times daily.   cyclobenzaprine 10 MG tablet Commonly known as: FLEXERIL Take 1 tablet (10 mg total) by mouth daily as needed for muscle spasms.   dapagliflozin propanediol 10 MG Tabs tablet Commonly known as: Farxiga Take 1 tablet (10 mg total) by mouth daily.   Dexcom G6 Sensor Misc Inject 1  Device into the skin as directed.   Dexcom G6 Transmitter Misc Inject 1 Device into the skin as directed.   divalproex 125 MG DR tablet Commonly known as: DEPAKOTE Take 125 mg by mouth 2 (two) times daily.   Emgality 120 MG/ML Soaj Generic drug: Galcanezumab-gnlm Inject into the skin.   fluconazole 150 MG tablet Commonly known as: DIFLUCAN Take one tablet by mouth on Day 1. Repeat dose 2nd tablet on Day 3.   FLUoxetine 20 MG capsule Commonly known as: PROZAC Take by mouth.   haloperidol 1 MG tablet Commonly known as: HALDOL Take by mouth.   Insulin Pen Needle 32G X 4 MM Misc 1 Device by Does not apply route  in the morning, at noon, in the evening, and at bedtime.   ketorolac 30 MG/ML injection Commonly known as: TORADOL Inject 30 mg into the muscle daily as needed (migraine headaches.).   Linzess 145 MCG Caps capsule Generic drug: linaclotide TAKE 1 CAPSULE BY MOUTH DAILY BEFORE BREAKFAST.   losartan 50 MG tablet Commonly known as: COZAAR Take by mouth.   methylphenidate 10 MG tablet Commonly known as: RITALIN 3 tabs in AM and 3 tabs in PM   metoprolol succinate 50 MG 24 hr tablet Commonly known as: TOPROL-XL Take 1.5 tablets (75 mg total) by mouth daily. Take with or immediately following a meal.   NovoLOG FlexPen 100 UNIT/ML FlexPen Generic drug: insulin aspart Max daily 24 units   OneTouch Delica Plus [id number] Misc 1 Device by Does not apply route as directed.   OneTouch Verio test strip Generic drug: glucose blood 1 each by Other route in the morning, at noon, in the evening, and at bedtime. Use as instructed   prazosin 5 MG capsule Commonly known as: MINIPRESS Take 10 mg by mouth at bedtime.   Tyler Aas FlexTouch 200 UNIT/ML FlexTouch Pen Generic drug: insulin degludec Inject 32 Units into the skin daily.   Visine-A 0.025-0.3 % ophthalmic solution Generic drug: naphazoline-pheniramine Place 1 drop into both eyes 4 (four) times daily as needed  for eye irritation.         OBJECTIVE:   Vital Signs: BP 120/80 (BP Location: Left Arm, Patient Position: Sitting, Cuff Size: Normal)   Pulse 74   Ht 5' 3" (1.6 m)   Wt 176 lb 12.8 oz (80.2 kg)   LMP 09/11/2018 (Exact Date)   SpO2 97%   BMI 31.32 kg/m   Wt Readings from Last 3 Encounters:  09/27/20 176 lb 12.8 oz (80.2 kg)  08/29/20 170 lb (77.1 kg)  08/22/20 165 lb (74.8 kg)     Exam: General: Pt appears well and is in NAD  Neck: General: Supple without adenopathy. Thyroid: Thyroid size normal.  No goiter or nodules appreciated.  Lungs: Clear with good BS bilat with no rales, rhonchi, or wheezes  Heart: RRR  Abdomen: Normoactive bowel sounds, soft, nontender, without masses or organomegaly palpable  Extremities: No pretibial edema.   Neuro: MS is good with appropriate affect, pt is alert and Ox3    DM foot exam: 04/04/2020   The skin of the feet is intact without sores or ulcerations. The pedal pulses are 2+ on right and 2+ on left. The sensation is intact to a screening 5.07, 10 gram monofilament bilaterally     DATA REVIEWED:  Lab Results  Component Value Date   HGBA1C 8.8 (A) 09/27/2020   HGBA1C 9.4 (A) 07/01/2020   HGBA1C 8.5 (A) 04/04/2020   Results for REECE, MCBROOM (MRN [id number]) as of 09/27/2020 07:28  Ref. Range 07/24/2020 11:31  Sodium Latest Ref Range: 135 - 146 mmol/L 138  Potassium Latest Ref Range: 3.5 - 5.3 mmol/L 4.3  Chloride Latest Ref Range: 98 - 110 mmol/L 106  CO2 Latest Ref Range: 20 - 32 mmol/L 22  Glucose Latest Ref Range: 65 - 99 mg/dL 208 (H)  BUN Latest Ref Range: 7 - 25 mg/dL 8  Creatinine Latest Ref Range: 0.50 - 1.05 mg/dL 0.79  Calcium Latest Ref Range: 8.6 - 10.4 mg/dL 9.2  BUN/Creatinine Ratio Latest Ref Range: 6 - 22 (calc) NOT APPLICABLE  AG Ratio Latest Ref Range: 1.0 - 2.5 (calc) 1.6  AST Latest Ref Range: 10 - 35  U/L 14  ALT Latest Ref Range: 6 - 29 U/L 21  Total Protein Latest Ref Range: 6.1 - 8.1 g/dL 7.1   Total Bilirubin Latest Ref Range: 0.2 - 1.2 mg/dL 0.5  GFR, Est Non African American Latest Ref Range: > OR = 60 mL/min/1.7m 87  GFR, Est African American Latest Ref Range: > OR = 60 mL/min/1.753m101  Total CHOL/HDL Ratio Latest Ref Range: <5.0 (calc) 2.2  Cholesterol Latest Ref Range: <200 mg/dL 118  HDL Cholesterol Latest Ref Range: > OR = 50 mg/dL 54  LDL Cholesterol (Calc) Latest Units: mg/dL (calc) 43  Non-HDL Cholesterol (Calc) Latest Ref Range: <130 mg/dL (calc) 64  Triglycerides Latest Ref Range: <150 mg/dL 125  Alkaline phosphatase (APISO) Latest Ref Range: 37 - 153 U/L 116  Globulin Latest Ref Range: 1.9 - 3.7 g/dL (calc) 2.7  WBC Latest Ref Range: 3.8 - 10.8 Thousand/uL 8.9  RBC Latest Ref Range: 3.80 - 5.10 Million/uL 4.63  Hemoglobin Latest Ref Range: 11.7 - 15.5 g/dL 13.5  HCT Latest Ref Range: 35.0 - 45.0 % 41.9  MCV Latest Ref Range: 80.0 - 100.0 fL 90.5  MCH Latest Ref Range: 27.0 - 33.0 pg 29.2  MCHC Latest Ref Range: 32.0 - 36.0 g/dL 32.2  RDW Latest Ref Range: 11.0 - 15.0 % 13.9  Platelets Latest Ref Range: 140 - 400 Thousand/uL 255  MPV Latest Ref Range: 7.5 - 12.5 fL 9.9  Neutrophils Latest Units: % 66.5  Monocytes Relative Latest Units: % 4.5  Eosinophil Latest Units: % 1.2  Basophil Latest Units: % 0.3  NEUT# Latest Ref Range: 1,500 - 7,800 cells/uL 5,919  Lymphocyte # Latest Ref Range: 850 - 3,900 cells/uL 2,448  Total Lymphocyte Latest Units: % 27.5  Eosinophils Absolute Latest Ref Range: 15 - 500 cells/uL 107  Basophils Absolute Latest Ref Range: 0 - 200 cells/uL 27  Absolute Monocytes Latest Ref Range: 200 - 950 cells/uL 401  STATUS: Unknown FINAL  Albumin MSPROF Latest Ref Range: 3.6 - 5.1 g/dL 4.4  URINE CULTURE Unknown Rpt (A)    ASSESSMENT / PLAN / RECOMMENDATIONS:   1) Type 2 Diabetes Mellitus, poorly controlled, With CKD III and neuropathic complications - Most recent A1c of 8.8%. Goal A1c < 7.0%.   -Her A1c is trending down but she  continues with hyperglycemia, she has fear of hypoglycemia but I cannot find evidence of that on the meter download.  She has reduced her Farxiga to 5 mg due to hypoglycemia.  It appears that her hypoglycemia happens after a correction -I explained to the patient that to correct for hypoglycemia we have to adjust the proper medication, and reducing the FaWilder Gladeas not the correct action but rather would have adjusted her sensitivity factor. - She is intolerant to metformin - She has tried Ozempic and Trulicity with persistent hyperglycemia -She is interested in the in pen I am not sure she qualifies at this time as she only uses prandial insulin as correction and not standing dose.  I am going to prescribe it and proceed from there -I will adjust her correction factor    MEDICATIONS: - Continue Tresiba 32 units daily  -Continue Farxiga, 10 mg tablet in the morning  -CF: NovoLog (BG -170/40)   EDUCATION / INSTRUCTIONS: BG monitoring instructions: Patient is instructed to check her blood sugars 3 times a day, breakfast lunch dinner times. [facility] if: BG persistently < 70 I reviewed the Rule of 15 for the treatment of hypoglycemia in  detail with the patient. Literature supplied.     2) Diabetic complications:  Eye: Does not have known diabetic retinopathy.  Neuro/ Feet: Does not have known diabetic peripheral neuropathy .  Renal: Patient does have known baseline CKD. She is on an ACEI/ARB at present.  She follows with [facility] nephrology.  Patient her GFR is above 35, ~in the 50s   3) Hypertriglyceridemia:    - Tg as high as 551 mg/dL in 2018.  Repeat in 07/2020 through PCP's office normal at 125 mg/dL     - Patient is on Atorvastatin 40 mg daily    4) Tobacco cessation:  -I have congratulated her on tobacco cessation and encouraged her to continue with avoiding smoking.   F/U in 4 months   Signed electronically by: Mack Guise,  MD  [facility] End[facility]  [facility] [address] [place], [place] [address] Phone: [phone] FAX: [phone]   CC: Olin Hauser, DO [place] [address] Phone: [phone]  Fax: [phone]  Return to Endocrinology clinic as below: Future Appointments  Date Time Provider [place]  10/08/2020  1:00 PM [place]  10/15/2020  2:15 PM Vanga, Tally Due, MD [facility] None

## 2020-09-28 ENCOUNTER — Other Ambulatory Visit: Payer: Self-pay | Admitting: Family Medicine

## 2020-09-28 DIAGNOSIS — J4540 Moderate persistent asthma, uncomplicated: Secondary | ICD-10-CM

## 2020-09-28 NOTE — Telephone Encounter (Signed)
Requested Prescriptions  Pending Prescriptions Disp Refills  . albuterol (VENTOLIN HFA) 108 (90 Base) MCG/ACT inhaler [Pharmacy Med Name: ALBUTEROL HFA (VENTOLIN) INH] 18 each 2    Sig: INHALE 1 TO 2 PUFFS INTO THE LUNGS EVERY 6 HOURS AS NEEDED FOR SHORTNESS OF BREAT     Pulmonology:  Beta Agonists Failed - 09/28/2020  9:02 AM      Failed - One inhaler should last at least one month. If the patient is requesting refills earlier, contact the patient to check for uncontrolled symptoms.      Passed - Valid encounter within last 12 months    Recent Outpatient Visits          1 month ago Abdominal distention   [place], DO   2 months ago Hypertriglyceridemia   [place], DO   4 months ago Acute non-recurrent frontal sinusitis   [place], DO   7 months ago Type 2 diabetes, controlled, with neuropathy [facility])   [facility] Olin Hauser, DO      Future Appointments            In 2 weeks Vanga, Tally Due, MD [place]

## 2020-09-30 ENCOUNTER — Ambulatory Visit: Payer: Self-pay | Admitting: General Practice

## 2020-09-30 DIAGNOSIS — E1165 Type 2 diabetes mellitus with hyperglycemia: Secondary | ICD-10-CM

## 2020-09-30 DIAGNOSIS — E1140 Type 2 diabetes mellitus with diabetic neuropathy, unspecified: Secondary | ICD-10-CM

## 2020-09-30 DIAGNOSIS — F331 Major depressive disorder, recurrent, moderate: Secondary | ICD-10-CM | POA: Diagnosis not present

## 2020-09-30 DIAGNOSIS — I10 Essential (primary) hypertension: Secondary | ICD-10-CM

## 2020-09-30 DIAGNOSIS — E781 Pure hyperglyceridemia: Secondary | ICD-10-CM | POA: Diagnosis not present

## 2020-09-30 DIAGNOSIS — Z794 Long term (current) use of insulin: Secondary | ICD-10-CM

## 2020-09-30 DIAGNOSIS — F3160 Bipolar disorder, current episode mixed, unspecified: Secondary | ICD-10-CM

## 2020-09-30 DIAGNOSIS — F419 Anxiety disorder, unspecified: Secondary | ICD-10-CM

## 2020-09-30 NOTE — Patient Instructions (Signed)
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 foods, such as cake, cookies, candy, chips, and soft drinks. How do I count carbohydrates in foods? There are two ways to count carbohydrates in food. You can read food labels or learn standard serving sizes of foods. You can use either of the methods or acombination of both. Using the Nutrition Facts label The Nutrition Facts list is included on the labels of almost all packaged foods and beverages in the [place] It includes: The serving size. Information about nutrients in each serving, including the grams (g) of carbohydrate per serving. To use the Nutrition Facts: Decide how many servings you will have. Multiply the number of servings by the number of carbohydrates per serving. The resulting number is the total amount of carbohydrates that you will be having. Learning the standard serving sizes of foods When you eat carbohydrate foods  that are not packaged or do not include Nutrition Facts on the label, you need to measure the servings in order to count the amount of carbohydrates. Measure the foods that you will eat with a food scale or measuring cup, if needed. Decide how many standard-size servings you will eat. Multiply the number of servings by 15. For foods that contain carbohydrates, one serving equals 15 g of carbohydrates. For example, if you eat 2 cups or 10 oz (300 g) of strawberries, you will have eaten 2 servings and 30 g of carbohydrates (2 servings x 15 g = 30 g). For foods that have more than one food mixed, such as soups and casseroles, you must count the carbohydrates in each food that is included. The following list contains standard serving sizes of common carbohydrate-rich foods. Each of these servings has about 15 g of carbohydrates: 1 slice of bread. 1 six-inch (15 cm) tortilla. ? cup or 2 oz (53 g) cooked rice or pasta.  cup or 3 oz (85 g) cooked or canned, drained and rinsed beans or lentils.  cup or 3 oz (85 g) starchy vegetable, such as peas, corn, or squash.  cup or 4 oz (120 g) hot cereal.  cup or 3 oz (85 g) boiled or mashed potatoes, or  or 3 oz (85 g) of a large baked potato.  cup or 4 fl oz (118 mL) fruit juice. 1 cup or 8 fl oz (237 mL) milk. 1 small or 4 oz (106 g) apple.  or 2 oz (63 g) of a medium banana. 1 cup or 5 oz (150 g) strawberries. 3 cups or 1 oz (24 g) popped popcorn. What is an example of carbohydrate counting? To calculate the number of carbohydrates in this sample meal, follow the stepsshown below. Sample meal 3 oz (85 g) chicken breast. ? cup or 4 oz (106 g) brown rice.  cup or 3 oz (85 g) corn. 1 cup or 8 fl oz (237 mL) milk. 1 cup or 5 oz (150 g) strawberries  with sugar-free whipped topping. Carbohydrate calculation Identify the foods that contain carbohydrates: Rice. Corn. Milk. Strawberries. Calculate how many servings you have of each food: 2  servings rice. 1 serving corn. 1 serving milk. 1 serving strawberries. Multiply each number of servings by 15 g: 2 servings rice x 15 g = 30 g. 1 serving corn x 15 g = 15 g. 1 serving milk x 15 g = 15 g. 1 serving strawberries x 15 g = 15 g. Add together all of the amounts to find the total grams of carbohydrates eaten: 30 g + 15 g + 15 g + 15 g = 75 g of carbohydrates total. What are tips for following this plan? Shopping Develop a meal plan and then make a shopping list. Buy fresh and frozen vegetables, fresh and frozen fruit, dairy, eggs, beans, lentils, and whole grains. Look at food labels. Choose foods that have more fiber and less sugar. Avoid processed foods and foods with added sugars. Meal planning Aim to have the same amount of carbohydrates at each meal and for each snack time. Plan to have regular, balanced meals and snacks. Where to find more information American Diabetes Association: www.diabetes.org Centers for Disease Control and Prevention: http://www.wolf.info/ Summary Carbohydrate counting is a method of keeping track of how many carbohydrates you eat. Eating carbohydrates naturally increases the amount of sugar (glucose) in the blood. Counting how many carbohydrates you eat improves your blood glucose control, which helps you manage your diabetes. A dietitian can help you make a meal plan and calculate how many carbohydrates you should have at each meal and snack. This information is not intended to replace advice given to you by your health care provider. Make sure you discuss any questions you have with your healthcare provider. Document Revised: 01/26/2019 Document Reviewed: 01/27/2019 Elsevier Patient Education  2021 [place]. Mindfulness-Based Stress Reduction Mindfulness-based stress reduction (MBSR) is a program that helps people learn to practice mindfulness. Mindfulness is the practice of intentionally paying attention to the present moment. MBSR focuses on  developing self-awareness, which allows you to respond to life stress without judgment or negative emotions. It can be learned and practiced through techniques such as education, breathing exercises, meditation, and yoga. MBSR includes several mindfulnesstechniques in one program. MBSR works best when you understand the treatment, are willing to try new things, and can commit to spending time practicing what you learn. MBSR training may include learning about: How your emotions, thoughts, and reactions affect your body. New ways to respond to things that cause negative thoughts to start (triggers). How to notice your thoughts and let go of them. Practicing awareness of everyday things that you normally do without thinking. The techniques and goals of different types of meditation. What are the benefits of MBSR? MBSR can have many benefits, which include helping you to: Develop self-awareness. This refers to knowing and understanding yourself. Learn skills and attitudes that help you to participate in your own health care. Learn new ways to care for yourself. Be more accepting about how things are, and let things go. Be less judgmental and approach things with an open mind. Be patient with yourself and trust yourself more. MBSR has also been shown to: Reduce negative emotions, such as depression and anxiety. Improve memory and focus. Change how you sense and approach pain. Boost your body's ability to fight infections. Help you connect better with other people. Improve your sense of well-being. Follow these instructions at home:  Find a local in-person or  online MBSR program. Set aside some time regularly for mindfulness practice. Find a mindfulness practice that works best for you. This may include one or more of the following: Meditation. Meditation involves focusing your mind on a certain thought or activity. Breathing awareness exercises. These help you to stay present by focusing on  your breath. Body scan. For this practice, you lie down and pay attention to each part of your body from head to toe. You can identify tension and soreness and intentionally relax parts of your body. Yoga. Yoga involves stretching and breathing, and it can improve your ability to move and be flexible. It can also provide an experience of testing your body's limits, which can help you release stress. Mindful eating. This way of eating involves focusing on the taste, texture, color, and smell of each bite of food. Because this slows down eating and helps you feel full sooner, it can be an important part of a weight-loss plan. Find a podcast or recording that provides guidance for breathing awareness, body scan, or meditation exercises. You can listen to these any time when you have a free moment to rest without distractions. Follow your treatment plan as told by your health care provider. This may include taking regular medicines and making changes to your diet or lifestyle as recommended. How to practice mindfulness To do a basic awareness exercise: Find a comfortable place to sit. Pay attention to the present moment. Observe your thoughts, feelings, and surroundings just as they are. Avoid placing judgment on yourself, your feelings, or your surroundings. Make note of any judgment that comes up, and let it go. Your mind may wander, and that is okay. Make note of when your thoughts drift, and return your attention to the present moment. To do basic mindfulness meditation: Find a comfortable place to sit. This may include a stable chair or a firm floor cushion. Sit upright with your back straight. Let your arms fall next to your side with your hands resting on your legs. If sitting in a chair, rest your feet flat on the floor. If sitting on a cushion, cross your legs in front of you. Keep your head in a neutral position with your chin dropped slightly. Relax your jaw and rest the tip of your tongue on  the roof of your mouth. Drop your gaze to the floor. You can close your eyes if you like. Breathe normally and pay attention to your breath. Feel the air moving in and out of your nose. Feel your belly expanding and relaxing with each breath. Your mind may wander, and that is okay. Make note of when your thoughts drift, and return your attention to your breath. Avoid placing judgment on yourself, your feelings, or your surroundings. Make note of any judgment or feelings that come up, let them go, and bring your attention back to your breath. When you are ready, lift your gaze or open your eyes. Pay attention to how your body feels after the meditation. Where to find more information You can find more information about MBSR from: Your health care provider. Community-based meditation centers or programs. Programs offered near you. Summary Mindfulness-based stress reduction (MBSR) is a program that teaches you how to intentionally pay attention to the present moment. It is used with other treatments to help you cope better with daily stress, emotions, and pain. MBSR focuses on developing self-awareness, which allows you to respond to life stress without judgment or negative emotions. MBSR programs may involve learning different  mindfulness practices, such as breathing exercises, meditation, yoga, body scan, or mindful eating. Find a mindfulness practice that works best for you, and set aside time for it on a regular basis. This information is not intended to replace advice given to you by your health care provider. Make sure you discuss any questions you have with your healthcare provider. Document Revised: 10/13/2019 Document Reviewed: 10/13/2019 Elsevier Patient Education  [place]. [email or web address]">  DASH Eating Plan DASH stands for Dietary Approaches to Stop Hypertension. The DASH eating plan is a healthy eating plan that has been shown  to: Reduce high blood pressure (hypertension). Reduce your risk for type 2 diabetes, heart disease, and stroke. Help with weight loss. What are tips for following this plan? Reading food labels Check food labels for the amount of salt (sodium) per serving. Choose foods with less than 5 percent of the Daily Value of sodium. Generally, foods with less than 300 milligrams (mg) of sodium per serving fit into this eating plan. To find whole grains, look for the word "whole" as the first word in the ingredient list. Shopping Buy products labeled as "low-sodium" or "no salt added." Buy fresh foods. Avoid canned foods and pre-made or frozen meals. Cooking Avoid adding salt when cooking. Use salt-free seasonings or herbs instead of table salt or sea salt. Check with your health care provider or pharmacist before using salt substitutes. Do not fry foods. Cook foods using healthy methods such as baking, boiling, grilling, roasting, and broiling instead. Cook with heart-healthy oils, such as olive, canola, avocado, soybean, or sunflower oil. Meal planning  Eat a balanced diet that includes: 4 or more servings of fruits and 4 or more servings of vegetables each day. Try to fill one-half of your plate with fruits and vegetables. 6-8 servings of whole grains each day. Less than 6 oz (170 g) of lean meat, poultry, or fish each day. A 3-oz (85-g) serving of meat is about the same size as a deck of cards. One egg equals 1 oz (28 g). 2-3 servings of low-fat dairy each day. One serving is 1 cup (237 mL). 1 serving of nuts, seeds, or beans 5 times each week. 2-3 servings of heart-healthy fats. Healthy fats called omega-3 fatty acids are found in foods such as walnuts, flaxseeds, fortified milks, and eggs. These fats are also found in cold-water fish, such as sardines, salmon, and mackerel. Limit how much you eat of: Canned or prepackaged foods. Food that is high in trans fat, such as some fried foods. Food  that is high in saturated fat, such as fatty meat. Desserts and other sweets, sugary drinks, and other foods with added sugar. Full-fat dairy products. Do not salt foods before eating. Do not eat more than 4 egg yolks a week. Try to eat at least 2 vegetarian meals a week. Eat more home-cooked food and less restaurant, buffet, and fast food.  Lifestyle When eating at a restaurant, ask that your food be prepared with less salt or no salt, if possible. If you drink alcohol: Limit how much you use to: 0-1 drink a day for women who are not pregnant. 0-2 drinks a day for men. Be aware of how much alcohol is in your drink. In the [place] one drink equals one 12 oz bottle of beer (355 mL), one 5 oz glass of wine (148 mL), or one 1 oz glass of hard liquor (44 mL). General information Avoid eating more than 2,300 mg of salt a day. If  you have hypertension, you may need to reduce your sodium intake to 1,500 mg a day. Work with your health care provider to maintain a healthy body weight or to lose weight. Ask what an ideal weight is for you. Get at least 30 minutes of exercise that causes your heart to beat faster (aerobic exercise) most days of the week. Activities may include walking, swimming, or biking. Work with your health care provider or dietitian to adjust your eating plan to your individual calorie needs. What foods should I eat? Fruits All fresh, dried, or frozen fruit. Canned fruit in natural juice (without addedsugar). Vegetables Fresh or frozen vegetables (raw, steamed, roasted, or grilled). Low-sodium or reduced-sodium tomato and vegetable juice. Low-sodium or reduced-sodium tomatosauce and tomato paste. Low-sodium or reduced-sodium canned vegetables. Grains Whole-grain or whole-wheat bread. Whole-grain or whole-wheat pasta. Brown rice. [place]. Bulgur. Whole-grain and low-sodium cereals. Pita bread.Low-fat, low-sodium crackers. Whole-wheat flour tortillas. Meats and other  proteins Skinless chicken or [place]. Ground chicken or [place]. Pork with fat trimmed off. Fish and seafood. Egg whites. Dried beans, peas, or lentils. Unsalted nuts, nut butters, and seeds. Unsalted canned beans. Lean cuts of beef with fat trimmed off. Low-sodium, lean precooked or cured meat, such as sausages or meatloaves. Dairy Low-fat (1%) or fat-free (skim) milk. Reduced-fat, low-fat, or fat-free cheeses. Nonfat, low-sodium ricotta or cottage cheese. Low-fat or nonfatyogurt. Low-fat, low-sodium cheese. Fats and oils Soft margarine without trans fats. Vegetable oil. Reduced-fat, low-fat, or light mayonnaise and salad dressings (reduced-sodium). Canola, safflower, olive, avocado, soybean, andsunflower oils. Avocado. Seasonings and condiments Herbs. Spices. Seasoning mixes without salt. Other foods Unsalted popcorn and pretzels. Fat-free sweets. The items listed above may not be a complete list of foods and beverages you can eat. Contact a dietitian for more information. What foods should I avoid? Fruits Canned fruit in a light or heavy syrup. Fried fruit. Fruit in cream or buttersauce. Vegetables Creamed or fried vegetables. Vegetables in a cheese sauce. Regular canned vegetables (not low-sodium or reduced-sodium). Regular canned tomato sauce and paste (not low-sodium or reduced-sodium). Regular tomato and vegetable juice(not low-sodium or reduced-sodium). Angie Fava. Olives. Grains Baked goods made with fat, such as croissants, muffins, or some breads. Drypasta or rice meal packs. Meats and other proteins Fatty cuts of meat. Ribs. Fried meat. Berniece Salines. Bologna, salami, and other precooked or cured meats, such as sausages or meat loaves. Fat from the back of a pig (fatback). Bratwurst. Salted nuts and seeds. Canned beans with added salt. Canned orsmoked fish. Whole eggs or egg yolks. Chicken or [place] with skin. Dairy Whole or 2% milk, cream, and half-and-half. Whole or full-fat cream cheese.  Whole-fat or sweetened yogurt. Full-fat cheese. Nondairy creamers. Whippedtoppings. Processed cheese and cheese spreads. Fats and oils Butter. Stick margarine. Lard. Shortening. Ghee. Bacon fat. Tropical oils, suchas coconut, palm kernel, or palm oil. Seasonings and condiments Onion salt, garlic salt, seasoned salt, table salt, and sea salt. Worcestershire sauce. Tartar sauce. Barbecue sauce. Teriyaki sauce. Soy sauce, including reduced-sodium. Steak sauce. Canned and packaged gravies. Fish sauce. Oyster sauce. Cocktail sauce. Store-bought horseradish. Ketchup. Mustard. Meat flavorings and tenderizers. Bouillon cubes. Hot sauces. Pre-made or packaged marinades. Pre-made or packaged taco seasonings. Relishes. Regular saladdressings. Other foods Salted popcorn and pretzels. The items listed above may not be a complete list of foods and beverages you should avoid. Contact a dietitian for more information. Where to find more information National Heart, Lung, and Blood Institute: https://wilson-eaton.com/ American Heart Association: www.heart.org Academy of Nutrition and Dietetics: www.eatright.org  [place]: www.kidney.org Summary The DASH eating plan is a healthy eating plan that has been shown to reduce high blood pressure (hypertension). It may also reduce your risk for type 2 diabetes, heart disease, and stroke. When on the DASH eating plan, aim to eat more fresh fruits and vegetables, whole grains, lean proteins, low-fat dairy, and heart-healthy fats. With the DASH eating plan, you should limit salt (sodium) intake to 2,300 mg a day. If you have hypertension, you may need to reduce your sodium intake to 1,500 mg a day. Work with your health care provider or dietitian to adjust your eating plan to your individual calorie needs. This information is not intended to replace advice given to you by your health care provider. Make sure you discuss any questions you have with your healthcare  provider. Document Revised: 12/30/2018 Document Reviewed: 12/30/2018 Elsevier Patient Education  2022 [employer].
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
## 2020-10-07 ENCOUNTER — Telehealth: Payer: Self-pay | Admitting: Internal Medicine

## 2020-10-07 NOTE — Telephone Encounter (Signed)
Patient called to check on the status of the 1 year RX for Dexcom G6 sensors and transmitter for [employer] and a Prior Authorization from [place].  Call back # [phone]

## 2020-10-07 NOTE — Telephone Encounter (Signed)
Called and spoke with patient to inform her that her information along with office was sent was sent [facility] and I pass the note  Adrienne who handle the the PA for  the office to look into , pt said that she was told by the [facility] that she needed a PA can you look into this

## 2020-10-08 ENCOUNTER — Other Ambulatory Visit (HOSPITAL_COMMUNITY): Payer: Self-pay

## 2020-10-08 ENCOUNTER — Ambulatory Visit: Payer: Medicare Other | Admitting: Licensed Clinical Social Worker

## 2020-10-08 DIAGNOSIS — F331 Major depressive disorder, recurrent, moderate: Secondary | ICD-10-CM

## 2020-10-08 DIAGNOSIS — F3160 Bipolar disorder, current episode mixed, unspecified: Secondary | ICD-10-CM

## 2020-10-08 DIAGNOSIS — F419 Anxiety disorder, unspecified: Secondary | ICD-10-CM

## 2020-10-08 NOTE — Telephone Encounter (Signed)
She said that said that [facility] is tell [facility] that m pen is what she need a PA on . She said that they have sent over information the office regarding this,I haven't seen any information can you look into this ,thanks

## 2020-10-09 DIAGNOSIS — I10 Essential (primary) hypertension: Secondary | ICD-10-CM | POA: Diagnosis not present

## 2020-10-09 DIAGNOSIS — E781 Pure hyperglyceridemia: Secondary | ICD-10-CM | POA: Diagnosis not present

## 2020-10-09 DIAGNOSIS — F331 Major depressive disorder, recurrent, moderate: Secondary | ICD-10-CM | POA: Diagnosis not present

## 2020-10-09 DIAGNOSIS — F3160 Bipolar disorder, current episode mixed, unspecified: Secondary | ICD-10-CM | POA: Diagnosis not present

## 2020-10-10 ENCOUNTER — Other Ambulatory Visit (HOSPITAL_COMMUNITY): Payer: Self-pay

## 2020-10-10 ENCOUNTER — Telehealth: Payer: Self-pay | Admitting: Pharmacy Technician

## 2020-10-10 NOTE — Telephone Encounter (Signed)
Spoke with Alejandra Frederick  she is going to reach out to [facility] to find out what the PA is for to further assist this patient.Once we have spoke with [facility] we handle the PA from there. Pt will be notify once this has been done.

## 2020-10-10 NOTE — Telephone Encounter (Signed)
[facility] Patient Advocate Encounter  Prior Authorization for InPen has been approved.    PA# Case ID [id number]  Called Pharmacy to process. Pt had already picked up for a $0 copay   Armanda Magic, CPhT Patient Advocate [facility] Phone: [phone] Fax:  [phone]

## 2020-10-10 NOTE — Telephone Encounter (Signed)
CMN for Dexcom products (not a an m pen))

## 2020-10-10 NOTE — Telephone Encounter (Signed)
Patient Advocate Encounter   Pt needs InPen device for Novolog cartradges she rcvd. Pharmacy gets a non-formulary rejection. CoverMyMeds says PA isn't needed. Submitted any way 10/10/20  Key: [id number]  Called ins to see if it needs to go through part B Was being transferred and got disconnected. Will call back. [phone]  If they call back. Please ask if this needs to go through Part B?  Thanks Armanda Magic, CPhT Patient Advocate [facility] Phone: [phone] Fax:  [phone]

## 2020-10-11 NOTE — Telephone Encounter (Signed)
Information and cmn has been fax to the company for patient.

## 2020-10-13 DIAGNOSIS — Z5321 Procedure and treatment not carried out due to patient leaving prior to being seen by health care provider: Secondary | ICD-10-CM | POA: Insufficient documentation

## 2020-10-13 DIAGNOSIS — R519 Headache, unspecified: Secondary | ICD-10-CM | POA: Diagnosis not present

## 2020-10-13 DIAGNOSIS — I10 Essential (primary) hypertension: Secondary | ICD-10-CM | POA: Insufficient documentation

## 2020-10-13 DIAGNOSIS — R002 Palpitations: Secondary | ICD-10-CM | POA: Insufficient documentation

## 2020-10-13 DIAGNOSIS — R079 Chest pain, unspecified: Secondary | ICD-10-CM | POA: Insufficient documentation

## 2020-10-13 DIAGNOSIS — F419 Anxiety disorder, unspecified: Secondary | ICD-10-CM | POA: Diagnosis not present
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
## 2020-10-14 ENCOUNTER — Other Ambulatory Visit: Payer: Self-pay

## 2020-10-14 ENCOUNTER — Emergency Department
Admission: EM | Admit: 2020-10-14 | Discharge: 2020-10-14 | Disposition: A | Discharge status: Home / Self Care | Payer: Medicare Other | Attending: Emergency Medicine | Admitting: Emergency Medicine

## 2020-10-14 ENCOUNTER — Emergency Department: Payer: Medicare Other

## 2020-10-14 DIAGNOSIS — R002 Palpitations: Secondary | ICD-10-CM | POA: Diagnosis not present

## 2020-10-14 LAB — CBC
HCT: 39.9 % (ref 36.0–46.0)
Hemoglobin: 13.2 g/dL (ref 12.0–15.0)
MCH: 30.5 pg (ref 26.0–34.0)
MCHC: 33.1 g/dL (ref 30.0–36.0)
MCV: 92.1 fL (ref 80.0–100.0)
Platelets: 237 10*3/uL (ref 150–400)
RBC: 4.33 MIL/uL (ref 3.87–5.11)
RDW: 13.6 % (ref 11.5–15.5)
WBC: 10.9 10*3/uL — ABNORMAL HIGH (ref 4.0–10.5)
nRBC: 0 % (ref 0.0–0.2)

## 2020-10-14 LAB — TROPONIN I (HIGH SENSITIVITY)
Troponin I (High Sensitivity): 3 ng/L (ref <18)
Troponin I (High Sensitivity): 3 ng/L (ref <18)

## 2020-10-14 LAB — BASIC METABOLIC PANEL
Anion gap: 8 (ref 5–15)
BUN: 14 mg/dL (ref 6–20)
CO2: 23 mmol/L (ref 22–32)
Calcium: 8.9 mg/dL (ref 8.9–10.3)
Chloride: 106 mmol/L (ref 98–111)
Creatinine, Ser: 0.91 mg/dL (ref 0.44–1.00)
GFR, Estimated: >60 mL/min (ref >60)
Glucose, Bld: 158 mg/dL — ABNORMAL HIGH (ref 70–99)
Potassium: 3.7 mmol/L (ref 3.5–5.1)
Sodium: 137 mmol/L (ref 135–145)
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
## 2020-10-15 ENCOUNTER — Ambulatory Visit: Payer: Medicare Other | Admitting: Gastroenterology

## 2020-10-15 ENCOUNTER — Other Ambulatory Visit: Payer: Self-pay | Admitting: Family Medicine

## 2020-10-15 ENCOUNTER — Ambulatory Visit: Payer: Medicare Other | Admitting: Nutrition

## 2020-10-15 DIAGNOSIS — J4540 Moderate persistent asthma, uncomplicated: Secondary | ICD-10-CM

## 2020-10-15 NOTE — Patient Instructions (Signed)
Visit Information   Goals Addressed             This Visit's Progress    Managing Mental Health Conditions and Obtaining Supportive Resources   On track    Patient Goals/Self-Care Activities: Over the next 120 days - barriers to treatment adherence identified - counseling provided - emotional liability acknowledged and normalized - participation in mental health treatment encouraged - self-awareness of emotional triggers encouraged - strategies to manage emotional triggers promoted - begin personal counseling - join a support group - practice positive thinking and self-talk        Patient verbalizes understanding of instructions provided today and agrees to view in [place].   Telephone follow up appointment with care management team member scheduled for:10/22/20  Christa See, MSW, [email or web address] Phone [phone] 6:15 AM
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
## 2020-10-16 NOTE — Telephone Encounter (Signed)
Requested Prescriptions  Pending Prescriptions Disp Refills  . fluticasone-salmeterol (ADVAIR DISKUS) 250-50 MCG/ACT AEPB 60 each 0    Sig: INHALE 1 PUFF TWICE A DAY     Pulmonology:  [facility] [employer] Passed - 10/15/2020  2:06 PM      Passed - Valid encounter within last 12 months    Recent Outpatient Visits          2 months ago Abdominal distention   [place], DO   2 months ago Hypertriglyceridemia   [facility] [place], Devonne Doughty, DO   4 months ago Acute non-recurrent frontal sinusitis   [place], DO   7 months ago Type 2 diabetes, controlled, with neuropathy [facility])   [facility] [place], Devonne Doughty, DO      Future Appointments            In 2 days [place], Devonne Doughty, [facility], [place]   In 4 weeks Marius Ditch, Tally Due, MD [place]

## 2020-10-18 ENCOUNTER — Ambulatory Visit: Payer: Medicare Other | Admitting: Family Medicine

## 2020-10-22 ENCOUNTER — Ambulatory Visit (INDEPENDENT_AMBULATORY_CARE_PROVIDER_SITE_OTHER): Payer: Medicare Other | Admitting: Licensed Clinical Social Worker

## 2020-10-22 DIAGNOSIS — E1140 Type 2 diabetes mellitus with diabetic neuropathy, unspecified: Secondary | ICD-10-CM

## 2020-10-22 DIAGNOSIS — F603 Borderline personality disorder: Secondary | ICD-10-CM

## 2020-10-22 DIAGNOSIS — F3160 Bipolar disorder, current episode mixed, unspecified: Secondary | ICD-10-CM

## 2020-10-22 DIAGNOSIS — F331 Major depressive disorder, recurrent, moderate: Secondary | ICD-10-CM

## 2020-10-22 DIAGNOSIS — K279 Peptic ulcer, site unspecified, unspecified as acute or chronic, without hemorrhage or perforation: Secondary | ICD-10-CM

## 2020-10-23 ENCOUNTER — Ambulatory Visit: Payer: Medicare Other | Admitting: Licensed Clinical Social Worker

## 2020-10-23 ENCOUNTER — Telehealth: Payer: Self-pay | Admitting: Nutrition

## 2020-10-23 DIAGNOSIS — F419 Anxiety disorder, unspecified: Secondary | ICD-10-CM

## 2020-10-23 DIAGNOSIS — F331 Major depressive disorder, recurrent, moderate: Secondary | ICD-10-CM

## 2020-10-23 DIAGNOSIS — E1140 Type 2 diabetes mellitus with diabetic neuropathy, unspecified: Secondary | ICD-10-CM

## 2020-10-23 DIAGNOSIS — I10 Essential (primary) hypertension: Secondary | ICD-10-CM

## 2020-10-23 DIAGNOSIS — F3160 Bipolar disorder, current episode mixed, unspecified: Secondary | ICD-10-CM

## 2020-10-23 DIAGNOSIS — F603 Borderline personality disorder: Secondary | ICD-10-CM

## 2020-10-23 NOTE — Telephone Encounter (Signed)
Message left on my voice mail wanting to schedule training for the [employer].  Tried calling back.  Left message to call me to schedule.

## 2020-10-25 ENCOUNTER — Encounter: Payer: Self-pay | Admitting: Internal Medicine

## 2020-10-25 NOTE — Patient Instructions (Signed)
Visit Information   Goals Addressed             This Visit's Progress    Managing Mental Health Conditions and Obtaining Supportive Resources   On track    Patient Goals/Self-Care Activities: Over the next 120 days - barriers to treatment adherence identified - counseling provided - emotional liability acknowledged and normalized - participation in mental health treatment encouraged - self-awareness of emotional triggers encouraged - strategies to manage emotional triggers promoted - begin personal counseling - join a support group - practice positive thinking and self-talk        Patient verbalizes understanding of instructions provided today and agrees to view in [place].   Telephone follow up appointment with care management team member scheduled for:10/23/20  Alejandra Frederick, MSW, [name] Phone [phone] 9:38 AM
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
## 2020-10-28 ENCOUNTER — Encounter: Payer: Self-pay | Admitting: Family Medicine
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
## 2020-10-28 NOTE — Patient Instructions (Signed)
Visit Information   Goals Addressed             This Visit's Progress    Managing Mental Health Conditions and Obtaining Supportive Resources   On track    Patient Goals/Self-Care Activities: Over the next 120 days - barriers to treatment adherence identified - counseling provided - emotional liability acknowledged and normalized - participation in mental health treatment encouraged - self-awareness of emotional triggers encouraged - strategies to manage emotional triggers promoted - begin personal counseling - join a support group - practice positive thinking and self-talk        Patient verbalizes understanding of instructions provided today and agrees to view in [place].   Telephone follow up appointment with care management team member scheduled for:11/06/20  Christa See, MSW, [email or web address] Phone [phone] 6:57 AM

## 2020-10-30 ENCOUNTER — Encounter: Payer: Self-pay | Admitting: Internal Medicine

## 2020-10-30 ENCOUNTER — Other Ambulatory Visit: Payer: Self-pay

## 2020-10-30 ENCOUNTER — Encounter: Payer: Medicare Other | Attending: Internal Medicine | Admitting: Nutrition

## 2020-10-30 DIAGNOSIS — E1140 Type 2 diabetes mellitus with diabetic neuropathy, unspecified: Secondary | ICD-10-CM

## 2020-10-30 NOTE — Progress Notes (Signed)
Patient downloaded the InPen app and linked this to hr pump.  Settings were put in per DR. Shamleffer's orders: Tresiba:30u at 8PM, set dose amounts of 2u with meals and 0u for HS snack.  Target Blood sugar set at 130 Per patient's request, ISF: 40, timing 4 hours. She was shown how to calculate the doses for each meal, by putting in her dose for the meal, as well as her blood sugar readings from the Dexcom.  She re demonstrated this correctly, and had no questions.  We also discussed IOB, and how this is calculated into her bolus amounts. She will go home to watch the videos again.  She was also show how to insert the cartridge of novolog insulin, attach the needle,  prime the pen, and and dial/inject the dose.  She reported having watched all of the videos and had no final questions. Printed instructions were given to her on how to use the pen to calculate her meal time insulin.   Patient verbalized that she has given no Novolog in the last 3 days, and only rise in blood sugar has been when she went to court each day.  We downloaded the Dexcom and this was put on Dr. Quin Hoop desk.

## 2020-10-31 NOTE — Patient Instructions (Signed)
Read over instructions given and watch videos on how to use the pen. Call 800 help line if questions.

## 2020-11-06 ENCOUNTER — Ambulatory Visit: Payer: Medicare Other | Admitting: Licensed Clinical Social Worker

## 2020-11-06 DIAGNOSIS — F331 Major depressive disorder, recurrent, moderate: Secondary | ICD-10-CM

## 2020-11-06 DIAGNOSIS — F3160 Bipolar disorder, current episode mixed, unspecified: Secondary | ICD-10-CM

## 2020-11-06 DIAGNOSIS — I10 Essential (primary) hypertension: Secondary | ICD-10-CM

## 2020-11-06 DIAGNOSIS — F603 Borderline personality disorder: Secondary | ICD-10-CM

## 2020-11-06 DIAGNOSIS — F419 Anxiety disorder, unspecified: Secondary | ICD-10-CM

## 2020-11-06 DIAGNOSIS — E1140 Type 2 diabetes mellitus with diabetic neuropathy, unspecified: Secondary | ICD-10-CM

## 2020-11-06 NOTE — Patient Instructions (Signed)
Visit Information   Goals Addressed             This Visit's Progress    Managing Mental Health Conditions and Obtaining Supportive Resources   On track    Patient Goals/Self-Care Activities: Over the next 120 days - barriers to treatment adherence identified - counseling provided - emotional liability acknowledged and normalized - participation in mental health treatment encouraged - self-awareness of emotional triggers encouraged - strategies to manage emotional triggers promoted - begin personal counseling - join a support group - practice positive thinking and self-talk        Patient verbalizes understanding of instructions provided today and agrees to view in [place].   Telephone follow up appointment with care management team member scheduled for:11/13/20  Christa See, MSW, [email or web address] Phone [phone] 4:05 PM
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
## 2020-11-08 DIAGNOSIS — E1140 Type 2 diabetes mellitus with diabetic neuropathy, unspecified: Secondary | ICD-10-CM

## 2020-11-08 DIAGNOSIS — F331 Major depressive disorder, recurrent, moderate: Secondary | ICD-10-CM | POA: Diagnosis not present

## 2020-11-08 DIAGNOSIS — F3160 Bipolar disorder, current episode mixed, unspecified: Secondary | ICD-10-CM

## 2020-11-08 DIAGNOSIS — I10 Essential (primary) hypertension: Secondary | ICD-10-CM | POA: Diagnosis not present

## 2020-11-13 ENCOUNTER — Telehealth: Payer: Medicare Other

## 2020-11-14 ENCOUNTER — Ambulatory Visit: Payer: Medicare Other | Admitting: Gastroenterology

## 2020-11-17 ENCOUNTER — Other Ambulatory Visit: Payer: Self-pay | Admitting: Family Medicine

## 2020-11-17 DIAGNOSIS — J4540 Moderate persistent asthma, uncomplicated: Secondary | ICD-10-CM

## 2020-11-17 NOTE — Telephone Encounter (Signed)
Requested Prescriptions  Pending Prescriptions Disp Refills  . fluticasone-salmeterol (ADVAIR) 250-50 MCG/ACT AEPB [Pharmacy Med Name: ADVAIR 250-50 DISKUS] 60 each 0    Sig: TAKE 1 PUFF BY MOUTH TWICE A DAY     Pulmonology:  [facility] [employer] Passed - 11/17/2020  6:48 PM      Passed - Valid encounter within last 12 months    Recent Outpatient Visits          3 months ago Abdominal distention   [place], DO   3 months ago Hypertriglyceridemia   [facility], Devonne Doughty, DO   5 months ago Acute non-recurrent frontal sinusitis   [place], DO   8 months ago Type 2 diabetes, controlled, with neuropathy [facility])   [facility], Devonne Doughty, DO      Future Appointments            In 1 month [facility], [facility]

## 2020-11-25 ENCOUNTER — Ambulatory Visit (INDEPENDENT_AMBULATORY_CARE_PROVIDER_SITE_OTHER): Payer: Medicare Other

## 2020-11-25 ENCOUNTER — Telehealth: Payer: Medicare Other | Admitting: General Practice

## 2020-11-25 DIAGNOSIS — F331 Major depressive disorder, recurrent, moderate: Secondary | ICD-10-CM

## 2020-11-25 DIAGNOSIS — E1140 Type 2 diabetes mellitus with diabetic neuropathy, unspecified: Secondary | ICD-10-CM

## 2020-11-25 DIAGNOSIS — E781 Pure hyperglyceridemia: Secondary | ICD-10-CM

## 2020-11-25 DIAGNOSIS — F419 Anxiety disorder, unspecified: Secondary | ICD-10-CM

## 2020-11-25 DIAGNOSIS — I10 Essential (primary) hypertension: Secondary | ICD-10-CM

## 2020-11-25 DIAGNOSIS — F3160 Bipolar disorder, current episode mixed, unspecified: Secondary | ICD-10-CM

## 2020-11-25 DIAGNOSIS — Z794 Long term (current) use of insulin: Secondary | ICD-10-CM

## 2020-11-25 DIAGNOSIS — E1165 Type 2 diabetes mellitus with hyperglycemia: Secondary | ICD-10-CM
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
## 2020-11-26 ENCOUNTER — Ambulatory Visit: Payer: Medicare Other | Admitting: Licensed Clinical Social Worker

## 2020-11-26 DIAGNOSIS — E1140 Type 2 diabetes mellitus with diabetic neuropathy, unspecified: Secondary | ICD-10-CM

## 2020-11-26 DIAGNOSIS — F3160 Bipolar disorder, current episode mixed, unspecified: Secondary | ICD-10-CM

## 2020-11-26 DIAGNOSIS — I10 Essential (primary) hypertension: Secondary | ICD-10-CM

## 2020-11-26 DIAGNOSIS — F331 Major depressive disorder, recurrent, moderate: Secondary | ICD-10-CM

## 2020-11-26 DIAGNOSIS — F603 Borderline personality disorder: Secondary | ICD-10-CM

## 2020-11-27 ENCOUNTER — Ambulatory Visit: Payer: Self-pay | Admitting: Licensed Clinical Social Worker

## 2020-11-27 DIAGNOSIS — E1140 Type 2 diabetes mellitus with diabetic neuropathy, unspecified: Secondary | ICD-10-CM

## 2020-11-27 DIAGNOSIS — I10 Essential (primary) hypertension: Secondary | ICD-10-CM

## 2020-11-27 DIAGNOSIS — F331 Major depressive disorder, recurrent, moderate: Secondary | ICD-10-CM

## 2020-11-27 DIAGNOSIS — F603 Borderline personality disorder: Secondary | ICD-10-CM

## 2020-11-27 NOTE — Patient Instructions (Signed)
Visit Information   Goals Addressed             This Visit's Progress    Managing Mental Health Conditions and Obtaining Supportive Resources   On track    Patient Goals/Self-Care Activities: Over the next 120 days - barriers to treatment adherence identified - counseling provided - emotional liability acknowledged and normalized - participation in mental health treatment encouraged - self-awareness of emotional triggers encouraged - strategies to manage emotional triggers promoted - begin personal counseling - join a support group - practice positive thinking and self-talk        Patient verbalizes understanding of instructions provided today and agrees to view in [place].   Telephone follow up appointment with care management team member scheduled for:12/04/20  Christa See, MSW, [email or web address] Phone [phone] 12:51 PM
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
## 2020-12-04 ENCOUNTER — Telehealth: Payer: Medicare Other

## 2020-12-08 ENCOUNTER — Telehealth: Payer: Self-pay | Admitting: Licensed Clinical Social Worker

## 2020-12-08 NOTE — Telephone Encounter (Signed)
    Clinical Social Work  Care Management   Phone Outreach    12/08/2020 Name: Alejandra Frederick MRN: [id number] DOB: 1970/07/13  Alejandra Frederick is a 50 y.o. year old female who is a primary care patient of Olin Hauser, DO .   Reason for referral: [place] and Resources.    F/U phone call today to assess needs, progress and barriers with care plan goals.   Telephone outreach was unsuccessful. A HIPPA compliant phone message was left for the patient providing contact information and requesting a return call.   Plan:CCM LCSW will wait for return call. If no return call is received, Will reach out to patient again in the next 30 days .   Review of patient status, including review of consultants reports, relevant laboratory and other test results, and collaboration with appropriate care team members and the patient's provider was performed as part of comprehensive patient evaluation and provision of care management services.    Christa See, MSW, [place] [facility] Care Management [email or web address] Phone [phone] 10:13 PM

## 2020-12-09 DIAGNOSIS — F331 Major depressive disorder, recurrent, moderate: Secondary | ICD-10-CM

## 2020-12-09 DIAGNOSIS — F3160 Bipolar disorder, current episode mixed, unspecified: Secondary | ICD-10-CM

## 2020-12-09 DIAGNOSIS — I10 Essential (primary) hypertension: Secondary | ICD-10-CM

## 2020-12-09 DIAGNOSIS — E781 Pure hyperglyceridemia: Secondary | ICD-10-CM | POA: Diagnosis not present

## 2020-12-09 DIAGNOSIS — E1140 Type 2 diabetes mellitus with diabetic neuropathy, unspecified: Secondary | ICD-10-CM

## 2020-12-09 DIAGNOSIS — Z794 Long term (current) use of insulin: Secondary | ICD-10-CM

## 2020-12-09 DIAGNOSIS — E1165 Type 2 diabetes mellitus with hyperglycemia: Secondary | ICD-10-CM

## 2020-12-11 ENCOUNTER — Ambulatory Visit (INDEPENDENT_AMBULATORY_CARE_PROVIDER_SITE_OTHER): Payer: Medicare Other | Admitting: Licensed Clinical Social Worker

## 2020-12-11 ENCOUNTER — Telehealth: Payer: Self-pay | Admitting: Family Medicine

## 2020-12-11 ENCOUNTER — Telehealth: Payer: Self-pay | Admitting: Licensed Clinical Social Worker

## 2020-12-11 DIAGNOSIS — F603 Borderline personality disorder: Secondary | ICD-10-CM

## 2020-12-11 DIAGNOSIS — E0822 Diabetes mellitus due to underlying condition with diabetic chronic kidney disease: Secondary | ICD-10-CM

## 2020-12-11 DIAGNOSIS — E1140 Type 2 diabetes mellitus with diabetic neuropathy, unspecified: Secondary | ICD-10-CM

## 2020-12-11 DIAGNOSIS — F331 Major depressive disorder, recurrent, moderate: Secondary | ICD-10-CM

## 2020-12-11 DIAGNOSIS — F3160 Bipolar disorder, current episode mixed, unspecified: Secondary | ICD-10-CM

## 2020-12-11 NOTE — Telephone Encounter (Signed)
Left message for patient to call me back at [phone], we need to reschedule the Medicare Annual Wellness scheduled for 12/17/20 @ 1:20 pm to 11:00 am on 12/17/20. If patient calls back and this time does not work for her please reschedule her for another day with the [facility].  40 minute appointment  Any questions, please call me at [phone]

## 2020-12-11 NOTE — Patient Instructions (Addendum)
Visit Information  Patient verbalizes understanding of instructions provided today and agrees to view in [facility].   Telephone follow up appointment with care management team member scheduled for:12/15/20  Christa See, MSW, [email or web address] Phone [phone] 3:23 PM

## 2020-12-11 NOTE — Telephone Encounter (Signed)
    Clinical Social Work  Care Management   Phone Outreach    12/11/2020 Name: Alejandra Frederick MRN: [id number] DOB: [birth year]  Alejandra Frederick is a 50 y.o. year old female who is a primary care patient of Olin Hauser, DO .   Reason for referral: [place] and Resources.    F/U phone call today to assess needs, progress and barriers with care plan goals.   Telephone outreach was unsuccessful. 2nd unsuccessful telephone outreach attempt.  If unable to reach patient by phone on the 3rd attempt, will discontinue outreach calls but will be available at any time to provide services.   Plan:CCM LCSW will wait for return call. If no return call is received, Will reach out to patient again in the next 30 days .   Review of patient status, including review of consultants reports, relevant laboratory and other test results, and collaboration with appropriate care team members and the patient's provider was performed as part of comprehensive patient evaluation and provision of care management services.    Christa See, MSW, [place] [facility] Care Management [email or web address] Phone [phone] 10:06 AM
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
## 2020-12-17 ENCOUNTER — Ambulatory Visit (INDEPENDENT_AMBULATORY_CARE_PROVIDER_SITE_OTHER): Payer: Medicare Other | Admitting: Family Medicine

## 2020-12-17 DIAGNOSIS — Z0000 Encounter for general adult medical examination without abnormal findings: Secondary | ICD-10-CM

## 2020-12-17 NOTE — Progress Notes (Signed)
Subjective:   Alejandra Frederick is a 50 y.o. female who presents for Medicare Annual (Subsequent) preventive examination.  I connected with  Alejandra Frederick on 12/17/20 by a video enabled telemedicine application and verified that I am speaking with the correct person using two identifiers.   I discussed the limitations of evaluation and management by telemedicine. The patient expressed understanding and agreed to proceed.    Review of Systems     Cardiac Risk Factors include: advanced age (>26mn, >>58women);diabetes mellitus;hypertension;smoking/ tobacco exposure     Objective:    There were no vitals filed for this visit. There is no height or weight on file to calculate BMI.  Advanced Directives 12/17/2020 10/14/2020 08/22/2020 12/11/2018 10/09/2018 10/03/2018 09/29/2018  Does Patient Have a Medical Advance Directive? No No No No Yes Yes Yes  Type of Advance Directive - - - - HPress photographerLiving will HClawsonLiving will HMiltonLiving will  Does patient want to make changes to medical advance directive? - - - - - No - Patient declined No - Patient declined  Copy of HMilpitasin Chart? - - - - - Yes - validated most recent copy scanned in chart (See row information) Yes - validated most recent copy scanned in chart (See row information)  Would patient like information on creating a medical advance directive? No - Patient declined - - No - Patient declined - - -  Some encounter information is confidential and restricted. Go to Review Flowsheets activity to see all data.    Current Medications (verified) Outpatient Encounter Medications as of 12/17/2020  Medication Sig   albuterol (VENTOLIN HFA) 108 (90 Base) MCG/ACT inhaler INHALE 1 TO 2 PUFFS INTO THE LUNGS EVERY 6 HOURS AS NEEDED FOR SHORTNESS OF BREAT   atorvastatin (LIPITOR) 40 MG tablet Take 1 tablet (40 mg total) by mouth daily.   clonazePAM (KLONOPIN) 1 MG  tablet Take 1 mg by mouth 2 (two) times daily.   cloNIDine (CATAPRES) 0.3 MG tablet Take 1 tablet (0.3 mg total) by mouth 2 (two) times daily.   Continuous Blood Gluc Sensor (DEXCOM G6 SENSOR) MISC Inject 1 Device into the skin as directed.   Continuous Blood Gluc Transmit (DEXCOM G6 TRANSMITTER) MISC Inject 1 Device into the skin as directed.   cyclobenzaprine (FLEXERIL) 10 MG tablet Take 1 tablet (10 mg total) by mouth daily as needed for muscle spasms.   cyclobenzaprine (FLEXERIL) 5 MG tablet Take 5 mg by mouth at bedtime.   diphenhydrAMINE-Zinc Acetate (ANTI-ITCH EXTRA STRENGTH EX) Apply 1 application topically 4 (four) times daily as needed (itching.).   divalproex (DEPAKOTE) 125 MG DR tablet Take 125 mg by mouth 2 (two) times daily.   FARXIGA 5 MG TABS tablet Take 5 mg by mouth every morning.   fluconazole (DIFLUCAN) 150 MG tablet Take one tablet by mouth on Day 1. Repeat dose 2nd tablet on Day 3.   FLUoxetine (PROZAC) 20 MG capsule Take by mouth.   fluticasone-salmeterol (ADVAIR) 250-50 MCG/ACT AEPB TAKE 1 PUFF BY MOUTH TWICE A DAY   glucose blood (ONETOUCH VERIO) test strip 1 each by Other route in the morning, at noon, in the evening, and at bedtime. Use as instructed   insulin aspart (NOVOLOG) cartridge Max daily 25 units   Insulin Pen Needle 32G X 4 MM MISC 1 Device by Does not apply route in the morning, at noon, in the evening, and at bedtime.   ketorolac (TORADOL) 30  MG/ML injection Inject 30 mg into the muscle daily as needed (migraine headaches.).   Lancets (ONETOUCH DELICA PLUS [id number]) MISC 1 Device by Does not apply route as directed.   LINZESS 145 MCG CAPS capsule TAKE 1 CAPSULE BY MOUTH DAILY BEFORE BREAKFAST.   losartan (COZAAR) 50 MG tablet Take by mouth.   methylphenidate (RITALIN) 10 MG tablet 3 tabs in AM and 3 tabs in PM   metoprolol succinate (TOPROL-XL) 50 MG 24 hr tablet Take 1.5 tablets (75 mg total) by mouth daily. Take with or immediately following a meal.    naphazoline-pheniramine (VISINE-A) 0.025-0.3 % ophthalmic solution Place 1 drop into both eyes 4 (four) times daily as needed for eye irritation.   prazosin (MINIPRESS) 5 MG capsule Take 10 mg by mouth at bedtime.   TRESIBA FLEXTOUCH 200 UNIT/ML FlexTouch Pen Inject 32 Units into the skin daily.   cephALEXin (KEFLEX) 500 MG capsule Take 1 capsule (500 mg total) by mouth 3 (three) times daily. For 7 days (Patient not taking: Reported on 12/17/2020)   No facility-administered encounter medications on file as of 12/17/2020.    Allergies (verified) Bee venom, Ciprofloxacin, Iodinated diagnostic agents, Zofran [ondansetron hcl], Lactose intolerance (gi), and Olanzapine   History: Past Medical History:  Diagnosis Date   Anxiety    Asthma    Cervical cancer ([place]) 2007   surgical resection to freeze cells.    Complication of anesthesia    hard to wake up    Depression    Gastroparesis    Headache    High cholesterol    Hypertension    Myocardial infarction [facility]) 2016   Pulmonary embolus ([place])    Stroke ([place]) 2013   Past Surgical History:  Procedure Laterality Date   COLONOSCOPY WITH PROPOFOL N/A 08/22/2020   Procedure: COLONOSCOPY WITH PROPOFOL;  Surgeon: Lin Landsman, MD;  [facility] ENDOSCOPY;  Service: Gastroenterology;  Laterality: N/A;   CYSTOCELE REPAIR N/A 10/03/2018   Procedure: Anterior Colporrhaphy and Culdoplasty;  Surgeon: Ward, Honor Loh, MD;  Location: [facility] ORS;  Service: Gynecology;  Laterality: N/A;   LAPAROSCOPIC HYSTERECTOMY N/A 10/03/2018   Procedure: HYSTERECTOMY TOTAL LAPAROSCOPIC, bilateral Salpingectomy;  Surgeon: Ward, Honor Loh, MD;  Location: [facility] ORS;  Service: Gynecology;  Laterality: N/A;   TONSILLECTOMY     TUBAL LIGATION     Family History  Problem Relation Age of Onset   Multiple myeloma Mother 61   Bipolar disorder Mother    Diabetes Mother    Heart disease Father    Bipolar disorder Father    Multiple myeloma Sister 85   Bipolar disorder  Sister    Prostate cancer Paternal Grandfather    Social History   Socioeconomic History   Marital status: Legally Separated    Spouse name: Not on file   Number of children: Not on file   Years of education: Not on file   Highest education level: Not on file  Occupational History   Not on file  Tobacco Use   Smoking status: Some Days    Packs/day: 0.25    Types: Cigarettes   Smokeless tobacco: Never  Vaping Use   Vaping Use: Never used  Substance and Sexual Activity   Alcohol use: No   Drug use: Not Currently    Comment: Abstained since age 34   Sexual activity: Not on file  Other Topics Concern   Not on file  Social History Narrative   Not on file   Social Determinants of Health  Financial Resource Strain: Low Risk    Difficulty of Paying Living Expenses: Not hard at all  Food Insecurity: No Food Insecurity   Worried About Charity fundraiser in the Last Year: Never true   Ran Out of Food in the Last Year: Never true  Transportation Needs: No Transportation Needs   Lack of Transportation (Medical): No   Lack of Transportation (Non-Medical): No  Physical Activity: Sufficiently Active   Days of Exercise per Week: 6 days   Minutes of Exercise per Session: 90 min  Stress: Stress Concern Present   Feeling of Stress : Rather much  Social Connections: Moderately Integrated   Frequency of Communication with Friends and Family: More than three times a week   Frequency of Social Gatherings with Friends and Family: More than three times a week   Attends Religious Services: More than 4 times per year   Active Member of [employer] or Organizations: Yes   Attends Music therapist: More than 4 times per year   Marital Status: Separated    Tobacco Counseling Ready to quit: Not Answered Counseling given: Not Answered   Clinical Intake:  Pre-visit preparation completed: Yes  Pain : No/denies pain     Nutritional Risks: None Diabetes: Yes CBG done?:  No Did pt. bring in CBG monitor from home?: No  How often do you need to have someone help you when you read instructions, pamphlets, or other written materials from your doctor or pharmacy?: 1 - Never  Diabetic?  Yes  Nutrition Risk Assessment:  Has the patient had any N/V/D within the last 2 months?  No  Does the patient have any non-healing wounds?  No  Has the patient had any unintentional weight loss or weight gain?  No   Diabetes:  Is the patient diabetic?  Yes  If diabetic, was a CBG obtained today?  No  Did the patient bring in their glucometer from home?  No  How often do you monitor your CBG's? 5 x day has dexocom.   Financial Strains and Diabetes Management:  Are you having any financial strains with the device, your supplies or your medication? No .  Does the patient want to be seen by Chronic Care Management for management of their diabetes?  No  Would the patient like to be referred to a Nutritionist or for Diabetic Management?  No   Diabetic Exams:  Diabetic Eye Exam: Completed . Overdue for diabetic eye exam. Pt has been advised about the importance in completing this exam.  Diabetic Foot Exam: . Pt has been advised about the importance in completing this exam.   Interpreter Needed?: No  Information entered by :: Leroy Kennedy LPN   Activities of Daily Living In your present state of health, do you have any difficulty performing the following activities: 12/17/2020  Hearing? N  Vision? N  Difficulty concentrating or making decisions? N  Walking or climbing stairs? N  Dressing or bathing? N  Doing errands, shopping? N  Preparing Food and eating ? N  Using the Toilet? N  In the past six months, have you accidently leaked urine? N  Do you have problems with loss of bowel control? N  Managing your Medications? N  Managing your Finances? N  Housekeeping or managing your Housekeeping? N  Some recent data might be hidden    Patient Care Team: Olin Hauser, DO as PCP - General (Family Medicine) Kate Sable, MD as PCP - Cardiology (Cardiology) Christa See  D, LCSW as Education officer, museum (Licensed Holiday representative) Hall Busing, [name], RN as Case Manager  Indicate any recent [place] you may have received from other than [facility] providers in the past year (date may be approximate).     Assessment:   This is a routine wellness examination for [place].  Hearing/Vision screen Hearing Screening - Comments:: No trouble hearing Vision Screening - Comments:: Not up to date [facility]  Dietary issues and exercise activities discussed: Current Exercise Habits: Home exercise routine, Type of exercise: walking;strength training/weights, Time (Minutes): 35, Frequency (Times/Week): 5, Weekly Exercise (Minutes/Week): 175, Intensity: Mild   Goals Addressed             This Visit's Progress    Patient Stated       Keep current A1C  in range     RNCM: Set My Target A1C-Diabetes Type 2   On track    Timeframe:  Short-Term Goal Priority:  High Start Date:   09-30-2020                          Expected End Date:         [phone]              Follow Up Date 12/30/2020    - set target A1C    Why is this important?   Your target A1C is decided together by you and your doctor.  It is based on several things like your age and other health issues.    Notes: 09-30-2020: Most recent A1C on 09-27-2020 was 8.8. Goal is to get A1C at 7.0 or lower. The patient has a dexcom reader. Has appointment with new endocrinologist/dietician  on 10-15-2020. 11-25-2020: States that her blood sugars have been low and she has not been as stressed. Has not had to take insulin in 2 weeks. Reminded the patient to monitor blood sugars closely.        Depression Screen PHQ 2/9 Scores 09/30/2020  PHQ - 2 Score 0    Fall Risk Fall Risk  12/17/2020 03/01/2020  Falls in the past year? 0 0  Number falls in past yr: 0 0  Injury with Fall? 0 0   Follow up Falls evaluation completed;Falls prevention discussed -    FALL RISK PREVENTION PERTAINING TO THE HOME:  Any stairs in or around the home? No  If so, are there any without handrails? No  Home free of loose throw rugs in walkways, pet beds, electrical cords, etc? Yes  Adequate lighting in your home to reduce risk of falls? Yes   ASSISTIVE DEVICES UTILIZED TO PREVENT FALLS:  Life alert? No  Use of a cane, walker or w/c? No  Grab bars in the bathroom? No  Shower chair or bench in shower? No  Elevated toilet seat or a handicapped toilet? No   TIMED UP AND GO:  Was the test performed? No .    Cognitive Function:  Normal cognitive status assessed by direct observation by this Nurse Health Advisor. No abnormalities found.          Immunizations Immunization History  Administered Date(s) Administered   Hepatitis B, adult 05/11/2014   Pneumococcal Polysaccharide-23 05/11/2014    TDAP status: Up to date per patient   Flu Vaccine status: Due, Education has been provided regarding the importance of this vaccine. Advised may receive this vaccine at local pharmacy or Health Dept. Aware to provide a copy of the  vaccination record if obtained from local pharmacy or Health Dept. Verbalized acceptance and understanding.  Pneumococcal vaccine status: Due, Education has been provided regarding the importance of this vaccine. Advised may receive this vaccine at local pharmacy or Health Dept. Aware to provide a copy of the vaccination record if obtained from local pharmacy or Health Dept. Verbalized acceptance and understanding.  Covid-19 vaccine status: Declined, Education has been provided regarding the importance of this vaccine but patient still declined. Advised may receive this vaccine at local pharmacy or Health Dept.or vaccine clinic. Aware to provide a copy of the vaccination record if obtained from local pharmacy or Health Dept. Verbalized acceptance and  understanding.  Qualifies for Shingles Vaccine? Yes   Zostavax completed No   Shingrix Completed?: No.    Education has been provided regarding the importance of this vaccine. Patient has been advised to call insurance company to determine out of pocket expense if they have not yet received this vaccine. Advised may also receive vaccine at local pharmacy or Health Dept. Verbalized acceptance and understanding.  Screening Tests Health Maintenance  Topic Date Due   FOOT EXAM  Never done   OPHTHALMOLOGY EXAM  Never done   TETANUS/TDAP  Never done   PAP SMEAR-Modifier  Never done   Pneumococcal Vaccine 71-33 Years old (2 - PCV) 05/11/2015   MAMMOGRAM  Never done   COVID-19 Vaccine (1) 01/02/2021 (Originally 09/23/1970)   Hepatitis C Screening  03/01/2021 (Originally 03/25/1988)   HIV Screening  03/01/2021 (Originally 03/25/1985)   Zoster Vaccines- Shingrix (1 of 2) 03/19/2021 (Originally 03/25/1989)   INFLUENZA VACCINE  05/09/2021 (Originally 09/09/2020)   HEMOGLOBIN A1C  03/30/2021   COLONOSCOPY (Pts 45-73yr Insurance coverage will need to be confirmed)  08/22/2021   HPV VACCINES  Aged Out    Health Maintenance  Health Maintenance Due  Topic Date Due   FOOT EXAM  Never done   OPHTHALMOLOGY EXAM  Never done   TETANUS/TDAP  Never done   PAP SMEAR-Modifier  Never done   Pneumococcal Vaccine 122622Years old (2 - PCV) 05/11/2015   MAMMOGRAM  Never done    Colorectal cancer screening: Type of screening: Colonoscopy. Completed 2022. Repeat every 6 months years    Mammogram is scheduled per patient    Lung Cancer Screening: (Low Dose CT Chest recommended if Age 317-80years, 30 pack-year currently smoking OR have quit w/in 15years.) does not qualify.   Lung Cancer Screening Referral:   Additional Screening:  Hepatitis C Screening: does not qualify;   Vision Screening: Recommended annual ophthalmology exams for early detection of glaucoma and other disorders of the eye. Is the  patient up to date with their annual eye exam?  No  Who is the provider or what is the name of the office in which the patient attends annual eye exams?  If pt is not established with a provider, would they like to be referred to a provider to establish care? No .   Dental Screening: Recommended annual dental exams for proper oral hygiene  Community Resource Referral / Chronic Care Management: CRR required this visit?  No   CCM required this visit?  No      Plan:     I have personally reviewed and noted the following in the patient's chart:   Medical and social history Use of alcohol, tobacco or illicit drugs  Current medications and supplements including opioid prescriptions.  Functional ability and status Nutritional status Physical activity Advanced directives List of other physicians  Hospitalizations, surgeries, and ER visits in previous 12 months Vitals Screenings to include cognitive, depression, and falls Referrals and appointments  In addition, I have reviewed and discussed with patient certain preventive protocols, quality metrics, and best practice recommendations. A written personalized care plan for preventive services as well as general preventive health recommendations were provided to patient.     Leroy Kennedy, LPN   [id number]   Nurse Notes:

## 2020-12-23 ENCOUNTER — Telehealth: Payer: Self-pay | Admitting: Internal Medicine

## 2020-12-23 NOTE — Progress Notes (Signed)
I connected with  Alejandra Frederick on 12-17-2020 by a  telephone enabled telemedicine application and verified that I am speaking with the correct person using two identifiers.   I discussed the limitations of evaluation and management by telemedicine. The patient expressed understanding and agreed to proceed.  Patient location: home  Provider location:Tele-health visit   not in office

## 2020-12-23 NOTE — Telephone Encounter (Signed)
PT called concerning problem with Dexcom G6. Need advice concerning low BS without insulin taken. [phone]

## 2020-12-23 NOTE — Telephone Encounter (Signed)
Patient states that she is not taking her Novolog and that she has changed her Tresiba to 14 units at bedtime.  [place] has been printed out.

## 2020-12-23 NOTE — Telephone Encounter (Signed)
Patient notified of change and verbalized understanding

## 2020-12-24 ENCOUNTER — Telehealth: Payer: Self-pay | Admitting: Internal Medicine

## 2020-12-24 NOTE — Telephone Encounter (Signed)
Requesting if she can come in on the 12/5. She will be closer in [place] on 12/5, but she's 3hrs away on the 12/2.  Pt contact [phone]

## 2020-12-25 ENCOUNTER — Ambulatory Visit: Payer: Medicare Other | Admitting: Licensed Clinical Social Worker

## 2020-12-25 DIAGNOSIS — E1165 Type 2 diabetes mellitus with hyperglycemia: Secondary | ICD-10-CM

## 2020-12-25 DIAGNOSIS — F603 Borderline personality disorder: Secondary | ICD-10-CM

## 2020-12-25 DIAGNOSIS — E1140 Type 2 diabetes mellitus with diabetic neuropathy, unspecified: Secondary | ICD-10-CM

## 2020-12-25 DIAGNOSIS — F3160 Bipolar disorder, current episode mixed, unspecified: Secondary | ICD-10-CM

## 2020-12-25 DIAGNOSIS — I10 Essential (primary) hypertension: Secondary | ICD-10-CM

## 2020-12-30 ENCOUNTER — Ambulatory Visit: Payer: Medicare Other | Admitting: Internal Medicine

## 2020-12-30 ENCOUNTER — Telehealth: Payer: Medicare Other

## 2020-12-30 ENCOUNTER — Ambulatory Visit: Payer: Self-pay

## 2020-12-30 DIAGNOSIS — I10 Essential (primary) hypertension: Secondary | ICD-10-CM

## 2020-12-30 DIAGNOSIS — F3160 Bipolar disorder, current episode mixed, unspecified: Secondary | ICD-10-CM

## 2020-12-30 DIAGNOSIS — E781 Pure hyperglyceridemia: Secondary | ICD-10-CM

## 2020-12-30 DIAGNOSIS — F419 Anxiety disorder, unspecified: Secondary | ICD-10-CM

## 2020-12-30 DIAGNOSIS — E1140 Type 2 diabetes mellitus with diabetic neuropathy, unspecified: Secondary | ICD-10-CM

## 2020-12-30 DIAGNOSIS — F331 Major depressive disorder, recurrent, moderate: Secondary | ICD-10-CM
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
## 2020-12-30 NOTE — Chronic Care Management (AMB) (Signed)
    Clinical Social Work  Care Management   Phone Outreach    12/30/2020 Name: Alejandra Frederick MRN: [id number] DOB: Jan 06, 1971  Alejandra Frederick is a 50 y.o. year old female who is a primary care patient of Olin Hauser, DO .   Reason for referral: [place] [facility].    F/U phone call today to assess needs, progress and barriers with care plan goals.   Unable to keep phone appointment today and requested to reschedule.  Plan:Appointment was rescheduled with CCM LCSW  for 01/01/21  Review of patient status, including review of consultants reports, relevant laboratory and other test results, and collaboration with appropriate care team members and the patient's provider was performed as part of comprehensive patient evaluation and provision of care management services.    Christa See, MSW, [facility] Care Management [email or web address] Phone [phone] 4:20 AM
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
## 2021-01-01 ENCOUNTER — Ambulatory Visit: Payer: Medicare Other | Admitting: Licensed Clinical Social Worker

## 2021-01-01 DIAGNOSIS — F3160 Bipolar disorder, current episode mixed, unspecified: Secondary | ICD-10-CM

## 2021-01-01 DIAGNOSIS — F331 Major depressive disorder, recurrent, moderate: Secondary | ICD-10-CM

## 2021-01-01 DIAGNOSIS — E1140 Type 2 diabetes mellitus with diabetic neuropathy, unspecified: Secondary | ICD-10-CM

## 2021-01-01 DIAGNOSIS — F603 Borderline personality disorder: Secondary | ICD-10-CM

## 2021-01-01 DIAGNOSIS — I10 Essential (primary) hypertension: Secondary | ICD-10-CM
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
## 2021-01-06 NOTE — Patient Instructions (Signed)
Visit Information  Thank you for taking time to visit with me today. Please don't hesitate to contact me if I can be of assistance to you before our next scheduled telephone appointment.  Following are the goals we discussed today:  Patient Goals/Self-Care Activities: Over the next 120 days - barriers to treatment adherence identified - counseling provided - emotional liability acknowledged and normalized - participation in mental health treatment encouraged - self-awareness of emotional triggers encouraged - strategies to manage emotional triggers promoted - begin personal counseling - join a support group - practice positive thinking and self-talk  Our next appointment is by telephone on 01/22/21 at 3:30 PM  Please call the care guide team at [phone] if you need to cancel or reschedule your appointment.   Please call the Suicide and Crisis Lifeline: 988 call 911 if you are experiencing a Mental Health or [place] or need someone to talk to.  Patient verbalizes understanding of instructions provided today and agrees to view in [place].   Christa See, MSW, [facility] Care Management [email or web address] Phone [phone] 8:21 AM

## 2021-01-08 DIAGNOSIS — E785 Hyperlipidemia, unspecified: Secondary | ICD-10-CM

## 2021-01-08 DIAGNOSIS — E1159 Type 2 diabetes mellitus with other circulatory complications: Secondary | ICD-10-CM

## 2021-01-08 DIAGNOSIS — F319 Bipolar disorder, unspecified: Secondary | ICD-10-CM | POA: Diagnosis not present

## 2021-01-08 DIAGNOSIS — I10 Essential (primary) hypertension: Secondary | ICD-10-CM

## 2021-01-08 DIAGNOSIS — Z794 Long term (current) use of insulin: Secondary | ICD-10-CM

## 2021-01-10 ENCOUNTER — Ambulatory Visit: Payer: Medicare Other | Admitting: Internal Medicine

## 2021-01-22 ENCOUNTER — Ambulatory Visit (INDEPENDENT_AMBULATORY_CARE_PROVIDER_SITE_OTHER): Payer: Medicare Other | Admitting: Licensed Clinical Social Worker

## 2021-01-22 DIAGNOSIS — F3160 Bipolar disorder, current episode mixed, unspecified: Secondary | ICD-10-CM

## 2021-01-22 DIAGNOSIS — E1140 Type 2 diabetes mellitus with diabetic neuropathy, unspecified: Secondary | ICD-10-CM

## 2021-01-22 DIAGNOSIS — F603 Borderline personality disorder: Secondary | ICD-10-CM

## 2021-01-22 DIAGNOSIS — I10 Essential (primary) hypertension: Secondary | ICD-10-CM

## 2021-01-23 ENCOUNTER — Other Ambulatory Visit: Payer: Self-pay | Admitting: Family Medicine

## 2021-01-23 DIAGNOSIS — J4540 Moderate persistent asthma, uncomplicated: Secondary | ICD-10-CM

## 2021-01-23 NOTE — Telephone Encounter (Signed)
Requested Prescriptions  Pending Prescriptions Disp Refills   albuterol (VENTOLIN HFA) 108 (90 Base) MCG/ACT inhaler [Pharmacy Med Name: ALBUTEROL HFA (VENTOLIN) INH] 18 each 2    Sig: INHALE 1 TO 2 PUFFS INTO THE LUNGS EVERY 6 HOURS AS NEEDED FOR SHORTNESS OF BREATH     Pulmonology:  Beta Agonists Failed - 01/23/2021  1:34 AM      Failed - One inhaler should last at least one month. If the patient is requesting refills earlier, contact the patient to check for uncontrolled symptoms.      Passed - Valid encounter within last 12 months    Recent Outpatient Visits          1 month ago Encounter for [employer] annual wellness exam   [place], DO   5 months ago Abdominal distention   [place], DO   6 months ago Hypertriglyceridemia   [facility] [place], Devonne Doughty, DO   8 months ago Acute non-recurrent frontal sinusitis   [facility] [place], Devonne Doughty, DO   10 months ago Type 2 diabetes, controlled, with neuropathy [facility])   [facility], Devonne Doughty, DO
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
## 2021-01-23 NOTE — Patient Instructions (Addendum)
Visit Information  Thank you for taking time to visit with me today. Please don't hesitate to contact me if I can be of assistance to you before our next scheduled telephone appointment.  Following are the goals we discussed today:  Patient Goals/Self-Care Activities: Over the next 120 days - barriers to treatment adherence identified - counseling provided - emotional liability acknowledged and normalized - participation in mental health treatment encouraged - self-awareness of emotional triggers encouraged - strategies to manage emotional triggers promoted - begin personal counseling - join a support group - practice positive thinking and self-talk  Our next appointment is by telephone on 04/15/21 at 3:30 PM  Please call the care guide team at [phone] if you need to cancel or reschedule your appointment.   If you are experiencing a Mental Health or [place] or need someone to talk to, please call the Suicide and Crisis Lifeline: 988 call 911   Patient verbalizes understanding of instructions provided today and agrees to view in [place].   Christa See, MSW, [place] [facility] Care Management [email or web address] Phone [phone] 10:47 AM

## 2021-01-31 ENCOUNTER — Ambulatory Visit: Payer: Medicare Other | Admitting: Internal Medicine

## 2021-01-31 NOTE — Progress Notes (Deleted)
Name: Alejandra Frederick  Age/ Sex: 50 y.o., female   MRN/ DOB: [id number], 17-Sep-1970     PCP: Olin Hauser, DO   Reason for Endocrinology Evaluation: Type 2 Diabetes Mellitus  Initial Endocrine Consultative Visit:  04/04/2020    PATIENT IDENTIFIER: [name] is a 50 y.o. female with a past medical history of T2DM, Bipolar disorder, HTN , and Dyslipidemia.. The patient has followed with Endocrinology clinic since 04/04/2020 for consultative assistance with management of her diabetes.  DIABETIC HISTORY:  [name] was diagnosed with DM at age 55, this started as gestational diabetes.  She is intolerant to metformin due to GI side effects, she has also been tried on Ozempic/Trulicity as well as Jardiance. Her hemoglobin A1c has ranged from 7.5% in 2018, peaking at 7.6% in 2020.  On her initial visit to our clinic she had an A1c of 8.5%, she was on basal insulin as well as using NovoLog per sliding scale, we provided her with a correction scale, continue [place] and started her on Farxiga. SUBJECTIVE:   During the last visit (09/27/2020): A1c 8.8% Continued tresiba, Farxiga and Novolog per Correction scale      Today (01/31/2021): [name] is here for a follow up on diabetes management.  She checks her blood sugars multiple  times through CGM. The patient has not had hypoglycemic episodes since the last clinic visit.   S/P colonoscopy 08/2020  Was recently seen by PCP for GI discomfort and concerns about parasitic infection   Linzess has helped with abdominal pain  Quit smoking a month ago     HOME DIABETES REGIMEN:  Tresiba 32 units daily CF : NovoLog ( Bg-140/40) Farxiga 10 mg daily    Statin: yes ACE-I/ARB: yes     CONTINUOUS GLUCOSE MONITORING RECORD INTERPRETATION    Dates of Recording: 8/6- 09/27/2020  Sensor description:dexcom  Results statistics:   CGM use % of time 93  Average and SD 205/48  Time in range    32  %  % Time Above 180 51  %  Time above 250 17  % Time Below target 0      Glycemic patterns summary: hyperglycemia during the day and night but worse at night   Hyperglycemic episodes  During the day and night but worse at night   Hypoglycemic episodes occurred N/A  Overnight periods: high      DIABETIC COMPLICATIONS: Microvascular complications:  CKD III Denies: retinopathy, neuropathy Last Eye Exam: Completed 2021  Macrovascular complications:   Denies: CAD, CVA, PVD   HISTORY:  Past Medical History:  Past Medical History:  Diagnosis Date   Anxiety    Asthma    Cervical cancer ([place]) 2007   surgical resection to freeze cells.    Complication of anesthesia    hard to wake up    Depression    Gastroparesis    Headache    High cholesterol    Hypertension    Myocardial infarction [facility]) 2016   Pulmonary embolus ([place])    Stroke ([place]) 2013   Past Surgical History:  Past Surgical History:  Procedure Laterality Date   COLONOSCOPY WITH PROPOFOL N/A 08/22/2020   Procedure: COLONOSCOPY WITH PROPOFOL;  Surgeon: Lin Landsman, MD;  Location: [facility] ENDOSCOPY;  Service: Gastroenterology;  Laterality: N/A;   CYSTOCELE REPAIR N/A 10/03/2018   Procedure: Anterior Colporrhaphy and Culdoplasty;  Surgeon: Ward, Honor Loh, MD;  Location: [facility] ORS;  Service: Gynecology;  Laterality: N/A;   LAPAROSCOPIC HYSTERECTOMY  N/A 10/03/2018   Procedure: HYSTERECTOMY TOTAL LAPAROSCOPIC, bilateral Salpingectomy;  Surgeon: Ward, Honor Loh, MD;  Location: [facility] ORS;  Service: Gynecology;  Laterality: N/A;   TONSILLECTOMY     TUBAL LIGATION     Social History:  reports that she has been smoking cigarettes. She has been smoking an average of .25 packs per day. She has never used smokeless tobacco. She reports that she does not currently use drugs. She reports that she does not drink alcohol. Family History:  Family History  Problem Relation Age of Onset   Multiple myeloma Mother 67   Bipolar disorder Mother     Diabetes Mother    Heart disease Father    Bipolar disorder Father    Multiple myeloma Sister 24   Bipolar disorder Sister    Prostate cancer Paternal Grandfather      HOME MEDICATIONS: Allergies as of 01/31/2021       Reactions   Bee Venom Anaphylaxis   Ciprofloxacin Anaphylaxis   Iodinated Diagnostic Agents Swelling   Facial swelling  Fingers red and swelling  Facial swelling  Fingers red and swelling    Zofran [ondansetron Hcl] Anaphylaxis   Swelling of the throat and redness of the face   Lactose Intolerance (gi) Diarrhea   Upset stomach   Olanzapine Nausea Only, Other (See Comments)   Seizure like activity Seizure like activity        Medication List        Accurate as of January 31, 2021  7:38 AM. If you have any questions, ask your nurse or doctor.          albuterol 108 (90 Base) MCG/ACT inhaler Commonly known as: VENTOLIN HFA INHALE 1 TO 2 PUFFS INTO THE LUNGS EVERY 6 HOURS AS NEEDED FOR SHORTNESS OF BREATH   ANTI-ITCH EXTRA STRENGTH EX Apply 1 application topically 4 (four) times daily as needed (itching.).   atorvastatin 40 MG tablet Commonly known as: LIPITOR Take 1 tablet (40 mg total) by mouth daily.   cephALEXin 500 MG capsule Commonly known as: KEFLEX Take 1 capsule (500 mg total) by mouth 3 (three) times daily. For 7 days   clonazePAM 1 MG tablet Commonly known as: KLONOPIN Take 1 mg by mouth 2 (two) times daily.   cloNIDine 0.3 MG tablet Commonly known as: CATAPRES Take 1 tablet (0.3 mg total) by mouth 2 (two) times daily.   cyclobenzaprine 10 MG tablet Commonly known as: FLEXERIL Take 1 tablet (10 mg total) by mouth daily as needed for muscle spasms.   cyclobenzaprine 5 MG tablet Commonly known as: FLEXERIL Take 5 mg by mouth at bedtime.   Dexcom G6 Sensor Misc Inject 1 Device into the skin as directed.   Dexcom G6 Transmitter Misc Inject 1 Device into the skin as directed.   divalproex 125 MG DR tablet Commonly known  as: DEPAKOTE Take 125 mg by mouth 2 (two) times daily.   Farxiga 5 MG Tabs tablet Generic drug: dapagliflozin propanediol Take 5 mg by mouth every morning.   fluconazole 150 MG tablet Commonly known as: DIFLUCAN Take one tablet by mouth on Day 1. Repeat dose 2nd tablet on Day 3.   FLUoxetine 20 MG capsule Commonly known as: PROZAC Take by mouth.   fluticasone-salmeterol 250-50 MCG/ACT Aepb Commonly known as: ADVAIR TAKE 1 PUFF BY MOUTH TWICE A DAY   insulin aspart cartridge Commonly known as: NOVOLOG Max daily 25 units   Insulin Pen Needle 32G X 4 MM Misc 1 Device by  Does not apply route in the morning, at noon, in the evening, and at bedtime.   ketorolac 30 MG/ML injection Commonly known as: TORADOL Inject 30 mg into the muscle daily as needed (migraine headaches.).   Linzess 145 MCG Caps capsule Generic drug: linaclotide TAKE 1 CAPSULE BY MOUTH DAILY BEFORE BREAKFAST.   losartan 50 MG tablet Commonly known as: COZAAR Take by mouth.   methylphenidate 10 MG tablet Commonly known as: RITALIN 3 tabs in AM and 3 tabs in PM   metoprolol succinate 50 MG 24 hr tablet Commonly known as: TOPROL-XL Take 1.5 tablets (75 mg total) by mouth daily. Take with or immediately following a meal.   OneTouch Delica Plus EXNTZG01V Misc 1 Device by Does not apply route as directed.   OneTouch Verio test strip Generic drug: glucose blood 1 each by Other route in the morning, at noon, in the evening, and at bedtime. Use as instructed   prazosin 5 MG capsule Commonly known as: MINIPRESS Take 10 mg by mouth at bedtime.   Tyler Aas FlexTouch 200 UNIT/ML FlexTouch Pen Generic drug: insulin degludec Inject 32 Units into the skin daily.   Visine-A 0.025-0.3 % ophthalmic solution Generic drug: naphazoline-pheniramine Place 1 drop into both eyes 4 (four) times daily as needed for eye irritation.         OBJECTIVE:   Vital Signs: LMP 09/11/2018 (Exact Date)   Wt Readings from  Last 3 Encounters:  10/14/20 172 lb (78 kg)  09/27/20 176 lb 12.8 oz (80.2 kg)  08/29/20 170 lb (77.1 kg)     Exam: General: Pt appears well and is in NAD  Neck: General: Supple without adenopathy. Thyroid: Thyroid size normal.  No goiter or nodules appreciated.  Lungs: Clear with good BS bilat with no rales, rhonchi, or wheezes  Heart: RRR  Abdomen: Normoactive bowel sounds, soft, nontender, without masses or organomegaly palpable  Extremities: No pretibial edema.   Neuro: MS is good with appropriate affect, pt is alert and Ox3    DM foot exam: 04/04/2020   The skin of the feet is intact without sores or ulcerations. The pedal pulses are 2+ on right and 2+ on left. The sensation is intact to a screening 5.07, 10 gram monofilament bilaterally     DATA REVIEWED:  Lab Results  Component Value Date   HGBA1C 8.8 (A) 09/27/2020   HGBA1C 9.4 (A) 07/01/2020   HGBA1C 8.5 (A) 04/04/2020   Results for Alejandra, Frederick (MRN [id number]) as of 09/27/2020 07:28  Ref. Range 07/24/2020 11:31  Sodium Latest Ref Range: 135 - 146 mmol/L 138  Potassium Latest Ref Range: 3.5 - 5.3 mmol/L 4.3  Chloride Latest Ref Range: 98 - 110 mmol/L 106  CO2 Latest Ref Range: 20 - 32 mmol/L 22  Glucose Latest Ref Range: 65 - 99 mg/dL 208 (H)  BUN Latest Ref Range: 7 - 25 mg/dL 8  Creatinine Latest Ref Range: 0.50 - 1.05 mg/dL 0.79  Calcium Latest Ref Range: 8.6 - 10.4 mg/dL 9.2  BUN/Creatinine Ratio Latest Ref Range: 6 - 22 (calc) NOT APPLICABLE  AG Ratio Latest Ref Range: 1.0 - 2.5 (calc) 1.6  AST Latest Ref Range: 10 - 35 U/L 14  ALT Latest Ref Range: 6 - 29 U/L 21  Total Protein Latest Ref Range: 6.1 - 8.1 g/dL 7.1  Total Bilirubin Latest Ref Range: 0.2 - 1.2 mg/dL 0.5  GFR, Est Non African American Latest Ref Range: > OR = 60 mL/min/1.81m 87  GFR, Est African American  Latest Ref Range: > OR = 60 mL/min/1.49m 101  Total CHOL/HDL Ratio Latest Ref Range: <5.0 (calc) 2.2  Cholesterol Latest Ref Range:  <200 mg/dL 118  HDL Cholesterol Latest Ref Range: > OR = 50 mg/dL 54  LDL Cholesterol (Calc) Latest Units: mg/dL (calc) 43  Non-HDL Cholesterol (Calc) Latest Ref Range: <130 mg/dL (calc) 64  Triglycerides Latest Ref Range: <150 mg/dL 125  Alkaline phosphatase (APISO) Latest Ref Range: 37 - 153 U/L 116  Globulin Latest Ref Range: 1.9 - 3.7 g/dL (calc) 2.7  WBC Latest Ref Range: 3.8 - 10.8 Thousand/uL 8.9  RBC Latest Ref Range: 3.80 - 5.10 Million/uL 4.63  Hemoglobin Latest Ref Range: 11.7 - 15.5 g/dL 13.5  HCT Latest Ref Range: 35.0 - 45.0 % 41.9  MCV Latest Ref Range: 80.0 - 100.0 fL 90.5  MCH Latest Ref Range: 27.0 - 33.0 pg 29.2  MCHC Latest Ref Range: 32.0 - 36.0 g/dL 32.2  RDW Latest Ref Range: 11.0 - 15.0 % 13.9  Platelets Latest Ref Range: 140 - 400 Thousand/uL 255  MPV Latest Ref Range: 7.5 - 12.5 fL 9.9  Neutrophils Latest Units: % 66.5  Monocytes Relative Latest Units: % 4.5  Eosinophil Latest Units: % 1.2  Basophil Latest Units: % 0.3  NEUT# Latest Ref Range: 1,500 - 7,800 cells/uL 5,919  Lymphocyte # Latest Ref Range: 850 - 3,900 cells/uL 2,448  Total Lymphocyte Latest Units: % 27.5  Eosinophils Absolute Latest Ref Range: 15 - 500 cells/uL 107  Basophils Absolute Latest Ref Range: 0 - 200 cells/uL 27  Absolute Monocytes Latest Ref Range: 200 - 950 cells/uL 401  STATUS: Unknown FINAL  Albumin MSPROF Latest Ref Range: 3.6 - 5.1 g/dL 4.4  URINE CULTURE Unknown Rpt (A)    ASSESSMENT / PLAN / RECOMMENDATIONS:   1) Type 2 Diabetes Mellitus, poorly controlled, With CKD III and neuropathic complications - Most recent A1c of 8.8%. Goal A1c < 7.0%.   -Her A1c is trending down but she continues with hyperglycemia, she has fear of hypoglycemia but I cannot find evidence of that on the meter download.  She has reduced her Farxiga to 5 mg due to hypoglycemia.  It appears that her hypoglycemia happens after a correction -I explained to the patient that to correct for hypoglycemia we  have to adjust the proper medication, and reducing the FWilder Gladewas not the correct action but rather would have adjusted her sensitivity factor. - She is intolerant to metformin - She has tried Ozempic and Trulicity with persistent hyperglycemia -She is interested in the in pen I am not sure she qualifies at this time as she only uses prandial insulin as correction and not standing dose.  I am going to prescribe it and proceed from there -I will adjust her correction factor    MEDICATIONS: - Continue Tresiba 32 units daily  -Continue Farxiga, 10 mg tablet in the morning  -CF: NovoLog (BG -170/40)   EDUCATION / INSTRUCTIONS: BG monitoring instructions: Patient is instructed to check her blood sugars 3 times a day, breakfast lunch dinner times. [facility] if: BG persistently < 70 I reviewed the Rule of 15 for the treatment of hypoglycemia in detail with the patient. Literature supplied.     2) Diabetic complications:  Eye: Does not have known diabetic retinopathy.  Neuro/ Feet: Does not have known diabetic peripheral neuropathy .  Renal: Patient does have known baseline CKD. She is on an ACEI/ARB at present.  [facility]  Patient her GFR is above 35, ~in the 50s   3) Hypertriglyceridemia:    - Tg as high as 551 mg/dL in 2018.  Repeat in 07/2020 through PCP's office normal at 125 mg/dL     - Patient is on Atorvastatin 40 mg daily      F/U in 4 months   Signed electronically by: Mack Guise, MD  [facility]  [facility] [address] [place], [place] [address] Phone: [phone] FAX: [phone]   CC: Olin Hauser, DO [place] [address] Phone: [phone]  Fax: [phone]  Return to Endocrinology clinic as below: Future Appointments  Date Time Provider [place]  01/31/2021 11:10 AM Cai Flott, Melanie Crazier, MD  [facility] None  03/03/2021 11:45 AM [place] - C[facility] CASE MANAGER [place] [facility]  04/15/2021  3:30 PM [place] - [facility] SOCIAL WORK [place]

## 2021-02-26 ENCOUNTER — Other Ambulatory Visit: Payer: Self-pay | Admitting: Internal Medicine

## 2021-02-27 ENCOUNTER — Other Ambulatory Visit: Payer: Self-pay | Admitting: Obstetrics and Gynecology

## 2021-02-27 DIAGNOSIS — Z1231 Encounter for screening mammogram for malignant neoplasm of breast: Secondary | ICD-10-CM

## 2021-03-03 ENCOUNTER — Telehealth: Payer: Medicare Other

## 2021-03-03 ENCOUNTER — Ambulatory Visit (INDEPENDENT_AMBULATORY_CARE_PROVIDER_SITE_OTHER): Payer: Medicare Other

## 2021-03-03 DIAGNOSIS — E1140 Type 2 diabetes mellitus with diabetic neuropathy, unspecified: Secondary | ICD-10-CM

## 2021-03-03 DIAGNOSIS — F603 Borderline personality disorder: Secondary | ICD-10-CM

## 2021-03-03 DIAGNOSIS — F419 Anxiety disorder, unspecified: Secondary | ICD-10-CM

## 2021-03-03 DIAGNOSIS — F331 Major depressive disorder, recurrent, moderate: Secondary | ICD-10-CM

## 2021-03-03 DIAGNOSIS — E785 Hyperlipidemia, unspecified: Secondary | ICD-10-CM

## 2021-03-03 DIAGNOSIS — I10 Essential (primary) hypertension: Secondary | ICD-10-CM

## 2021-03-03 DIAGNOSIS — F3160 Bipolar disorder, current episode mixed, unspecified: Secondary | ICD-10-CM

## 2021-03-03 DIAGNOSIS — E1169 Type 2 diabetes mellitus with other specified complication: Secondary | ICD-10-CM
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
## 2021-03-03 NOTE — Progress Notes (Signed)
Pt has been scheduled in a sooner appt
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
## 2021-03-10 ENCOUNTER — Other Ambulatory Visit: Payer: Self-pay | Admitting: Family Medicine

## 2021-03-10 DIAGNOSIS — M62838 Other muscle spasm: Secondary | ICD-10-CM

## 2021-03-10 NOTE — Telephone Encounter (Signed)
Requested medications are due for refill today.  yes  Requested medications are on the active medications list.  yes  Last refill. 03/01/2020  Future visit scheduled.   no  Notes to clinic.  Medication not delegated.    Requested Prescriptions  Pending Prescriptions Disp Refills   cyclobenzaprine (FLEXERIL) 10 MG tablet [Pharmacy Med Name: CYCLOBENZAPRINE 10 MG TABLET] 30 tablet 3    Sig: TAKE 1 TABLET BY MOUTH ONCE DAILY AS NEEDED FOR MUSCLE SPASMS     Not Delegated - Analgesics:  Muscle Relaxants Failed - 03/10/2021  4:01 PM      Failed - This refill cannot be delegated      Passed - Valid encounter within last 6 months    Recent Outpatient Visits           2 months ago Encounter for [employer] annual wellness exam   [place], DO   7 months ago Abdominal distention   [place], DO   7 months ago Hypertriglyceridemia   [place], DO   9 months ago Acute non-recurrent frontal sinusitis   [facility] [place], Devonne Doughty, DO   1 year ago Type 2 diabetes, controlled, with neuropathy [facility])   [place], Devonne Doughty, DO

## 2021-03-11 DIAGNOSIS — I10 Essential (primary) hypertension: Secondary | ICD-10-CM | POA: Diagnosis not present

## 2021-03-11 DIAGNOSIS — E1140 Type 2 diabetes mellitus with diabetic neuropathy, unspecified: Secondary | ICD-10-CM

## 2021-03-11 DIAGNOSIS — F331 Major depressive disorder, recurrent, moderate: Secondary | ICD-10-CM

## 2021-03-11 DIAGNOSIS — F3160 Bipolar disorder, current episode mixed, unspecified: Secondary | ICD-10-CM

## 2021-03-11 DIAGNOSIS — E785 Hyperlipidemia, unspecified: Secondary | ICD-10-CM

## 2021-03-11 DIAGNOSIS — E1169 Type 2 diabetes mellitus with other specified complication: Secondary | ICD-10-CM

## 2021-03-18 ENCOUNTER — Ambulatory Visit (INDEPENDENT_AMBULATORY_CARE_PROVIDER_SITE_OTHER): Payer: Medicare Other | Admitting: Licensed Clinical Social Worker

## 2021-03-18 DIAGNOSIS — I10 Essential (primary) hypertension: Secondary | ICD-10-CM

## 2021-03-18 DIAGNOSIS — F331 Major depressive disorder, recurrent, moderate: Secondary | ICD-10-CM

## 2021-03-18 DIAGNOSIS — F3160 Bipolar disorder, current episode mixed, unspecified: Secondary | ICD-10-CM

## 2021-03-18 DIAGNOSIS — E1140 Type 2 diabetes mellitus with diabetic neuropathy, unspecified: Secondary | ICD-10-CM
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
## 2021-03-28 NOTE — Patient Instructions (Signed)
Visit Information  Thank you for taking time to visit with me today. Please don't hesitate to contact me if I can be of assistance to you before our next scheduled telephone appointment.  Following are the goals we discussed today:  Patient Goals/Self-Care Activities: Over the next 120 days Attend scheduled medical appointments Utilize healthy coping skills discussed and/or supportive resources provided Contact PCP office with any questions or concerns  Our next appointment is by telephone on 04/01/21 at 3:00 PM  Please call the care guide team at [phone] if you need to cancel or reschedule your appointment.   If you are experiencing a Mental Health or [place] or need someone to talk to, please call the [place] National Suicide Prevention Lifeline: 9491401717 or TTY: 240 388 3099 TTY [phone]) to talk to a trained counselor call 911   Patient verbalizes understanding of instructions and care plan provided today and agrees to view in [place]. Active MyChart status confirmed with patient.    Christa See, MSW, [facility] Care Management [email or web address] Phone [phone] 6:38 AM

## 2021-04-01 ENCOUNTER — Telehealth: Payer: Medicare Other

## 2021-04-04 ENCOUNTER — Ambulatory Visit (INDEPENDENT_AMBULATORY_CARE_PROVIDER_SITE_OTHER): Payer: Medicare Other | Admitting: Internal Medicine

## 2021-04-04 ENCOUNTER — Other Ambulatory Visit: Payer: Self-pay

## 2021-04-04 ENCOUNTER — Encounter: Payer: Self-pay | Admitting: Internal Medicine

## 2021-04-04 VITALS — BP 120/80 | HR 95 | Ht 63.0 in | Wt 141.0 lb

## 2021-04-04 DIAGNOSIS — E1140 Type 2 diabetes mellitus with diabetic neuropathy, unspecified: Secondary | ICD-10-CM | POA: Diagnosis not present

## 2021-04-04 LAB — POCT GLYCOSYLATED HEMOGLOBIN (HGB A1C): Hemoglobin A1C: 6.5 % — AB (ref 4.0–5.6)

## 2021-04-04 LAB — POCT GLUCOSE (DEVICE FOR HOME USE): Glucose Fasting, POC: 108 mg/dL — AB (ref 70–99)

## 2021-04-04 MED ORDER — ONETOUCH VERIO VI STRP: 1.0000 | ORAL_STRIP | Freq: Four times a day (QID) | 3 refills | Status: AC

## 2021-04-04 MED ORDER — DEXCOM G6 SENSOR MISC: 1.0000 | 3 refills | Status: DC

## 2021-04-04 MED ORDER — DEXCOM G6 TRANSMITTER MISC: 1.0000 | 3 refills | Status: DC

## 2021-04-04 MED ORDER — TRESIBA FLEXTOUCH 100 UNIT/ML ~~LOC~~ SOPN: 16.0000 [IU] | PEN_INJECTOR | Freq: Every day | SUBCUTANEOUS | 6 refills | Status: DC
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
## 2021-04-04 NOTE — Patient Instructions (Addendum)
-   Keep up the Good Work ! - Continue Tresiba 16 units daily    HOW TO TREAT LOW BLOOD SUGARS (Blood sugar LESS THAN 70 MG/DL) Please follow the RULE OF 15 for the treatment of hypoglycemia treatment (when your (blood sugars are less than 70 mg/dL)   STEP 1: Take 15 grams of carbohydrates when your blood sugar is low, which includes:  3-4 GLUCOSE TABS  OR 3-4 OZ OF JUICE OR REGULAR SODA OR ONE TUBE OF GLUCOSE GEL    STEP 2: RECHECK blood sugar in 15 MINUTES STEP 3: If your blood sugar is still low at the 15 minute recheck --> then, go back to STEP 1 and treat AGAIN with another 15 grams of carbohydrates.

## 2021-04-08 DIAGNOSIS — E1140 Type 2 diabetes mellitus with diabetic neuropathy, unspecified: Secondary | ICD-10-CM

## 2021-04-08 DIAGNOSIS — I10 Essential (primary) hypertension: Secondary | ICD-10-CM

## 2021-04-08 DIAGNOSIS — F331 Major depressive disorder, recurrent, moderate: Secondary | ICD-10-CM

## 2021-04-08 DIAGNOSIS — F3160 Bipolar disorder, current episode mixed, unspecified: Secondary | ICD-10-CM

## 2021-04-10 ENCOUNTER — Telehealth: Payer: Medicare Other

## 2021-04-10 ENCOUNTER — Other Ambulatory Visit: Payer: Self-pay | Admitting: Internal Medicine

## 2021-04-10 ENCOUNTER — Ambulatory Visit (INDEPENDENT_AMBULATORY_CARE_PROVIDER_SITE_OTHER): Payer: Medicare Other

## 2021-04-10 DIAGNOSIS — F603 Borderline personality disorder: Secondary | ICD-10-CM

## 2021-04-10 DIAGNOSIS — F419 Anxiety disorder, unspecified: Secondary | ICD-10-CM

## 2021-04-10 DIAGNOSIS — F3160 Bipolar disorder, current episode mixed, unspecified: Secondary | ICD-10-CM

## 2021-04-10 DIAGNOSIS — I10 Essential (primary) hypertension: Secondary | ICD-10-CM

## 2021-04-10 DIAGNOSIS — E781 Pure hyperglyceridemia: Secondary | ICD-10-CM

## 2021-04-10 DIAGNOSIS — E785 Hyperlipidemia, unspecified: Secondary | ICD-10-CM

## 2021-04-10 DIAGNOSIS — F331 Major depressive disorder, recurrent, moderate: Secondary | ICD-10-CM

## 2021-04-10 DIAGNOSIS — E1169 Type 2 diabetes mellitus with other specified complication: Secondary | ICD-10-CM

## 2021-04-10 DIAGNOSIS — E1140 Type 2 diabetes mellitus with diabetic neuropathy, unspecified: Secondary | ICD-10-CM

## 2021-04-10 DIAGNOSIS — E1165 Type 2 diabetes mellitus with hyperglycemia: Secondary | ICD-10-CM
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
## 2021-04-11 ENCOUNTER — Encounter: Payer: Self-pay | Admitting: Internal Medicine

## 2021-04-15 ENCOUNTER — Telehealth: Payer: Medicare Other

## 2021-05-05 ENCOUNTER — Telehealth: Payer: Medicare Other

## 2021-05-09 DIAGNOSIS — I10 Essential (primary) hypertension: Secondary | ICD-10-CM | POA: Diagnosis not present

## 2021-05-09 DIAGNOSIS — F331 Major depressive disorder, recurrent, moderate: Secondary | ICD-10-CM | POA: Diagnosis not present

## 2021-05-09 DIAGNOSIS — E1169 Type 2 diabetes mellitus with other specified complication: Secondary | ICD-10-CM

## 2021-05-09 DIAGNOSIS — E781 Pure hyperglyceridemia: Secondary | ICD-10-CM | POA: Diagnosis not present

## 2021-05-09 DIAGNOSIS — F3160 Bipolar disorder, current episode mixed, unspecified: Secondary | ICD-10-CM

## 2021-05-09 DIAGNOSIS — E785 Hyperlipidemia, unspecified: Secondary | ICD-10-CM

## 2021-05-09 DIAGNOSIS — E1140 Type 2 diabetes mellitus with diabetic neuropathy, unspecified: Secondary | ICD-10-CM

## 2021-05-09 DIAGNOSIS — Z794 Long term (current) use of insulin: Secondary | ICD-10-CM

## 2021-05-09 DIAGNOSIS — E1165 Type 2 diabetes mellitus with hyperglycemia: Secondary | ICD-10-CM

## 2021-05-22 ENCOUNTER — Other Ambulatory Visit: Payer: Self-pay | Admitting: Internal Medicine

## 2021-06-02 ENCOUNTER — Ambulatory Visit (INDEPENDENT_AMBULATORY_CARE_PROVIDER_SITE_OTHER): Payer: Medicare Other

## 2021-06-02 ENCOUNTER — Telehealth: Payer: Medicare Other

## 2021-06-02 DIAGNOSIS — E1169 Type 2 diabetes mellitus with other specified complication: Secondary | ICD-10-CM

## 2021-06-02 DIAGNOSIS — I10 Essential (primary) hypertension: Secondary | ICD-10-CM

## 2021-06-02 DIAGNOSIS — E1140 Type 2 diabetes mellitus with diabetic neuropathy, unspecified: Secondary | ICD-10-CM

## 2021-06-02 DIAGNOSIS — F419 Anxiety disorder, unspecified: Secondary | ICD-10-CM

## 2021-06-02 DIAGNOSIS — F331 Major depressive disorder, recurrent, moderate: Secondary | ICD-10-CM

## 2021-06-02 DIAGNOSIS — F3160 Bipolar disorder, current episode mixed, unspecified: Secondary | ICD-10-CM

## 2021-06-02 DIAGNOSIS — F603 Borderline personality disorder: Secondary | ICD-10-CM
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
## 2021-06-03 ENCOUNTER — Telehealth: Payer: Self-pay

## 2021-06-03 ENCOUNTER — Other Ambulatory Visit: Payer: Self-pay | Admitting: Family Medicine

## 2021-06-03 DIAGNOSIS — F3160 Bipolar disorder, current episode mixed, unspecified: Secondary | ICD-10-CM

## 2021-06-03 NOTE — Chronic Care Management (AMB) (Signed)
?  Care Management  ? ?Note ? ?06/03/2021 ?Name: Alejandra Frederick MRN: [id number] DOB: [birth year] ? ?[name] is a 51 y.o. year old female who is a primary care patient of Olin Hauser, DO and is actively engaged with the care management team. I reached out to [name] by phone today to assist with re-scheduling a follow up visit with the Licensed Clinical Social Worker ? ?Follow up plan: ?Unsuccessful telephone outreach attempt made. A HIPAA compliant phone message was left for the patient providing contact information and requesting a return call.  ?The care management team will reach out to the patient again over the next 7 days.  ?If patient returns call to provider office, please advise to call [place]  at [phone] ? ?Noreene Larsson, RMA ?Care Guide, Embedded Care Coordination ?  Care Management  ?[place], [place] [address] ?Direct Dial: [phone] ?Museum/gallery conservator.[email or web address] ?Website: [email or web address]   ?

## 2021-06-04 NOTE — Chronic Care Management (AMB) (Signed)
?  Care Management  ? ?Note ? ?06/04/2021 ?Name: Alejandra Frederick MRN: [id number] DOB: [birth year] ? ?Alejandra Frederick is a 51 y.o. year old female who is a primary care patient of Olin Hauser, DO and is actively engaged with the care management team. I reached out to [name] by phone today to assist with scheduling a follow up visit with the Licensed Clinical Social Worker ? ?Follow up plan: ?Telephone appointment with care management team member scheduled for:06/17/2021 ? ?Noreene Larsson, RMA ?Care Guide, Embedded Care Coordination ?[facility]  Care Management  ?[place], [place] [address] ?Direct Dial: [phone] ?Museum/gallery conservator.[email or web address] ?Website: [email or web address]  ? ?

## 2021-06-08 DIAGNOSIS — I10 Essential (primary) hypertension: Secondary | ICD-10-CM

## 2021-06-08 DIAGNOSIS — E1140 Type 2 diabetes mellitus with diabetic neuropathy, unspecified: Secondary | ICD-10-CM | POA: Diagnosis not present

## 2021-06-08 DIAGNOSIS — E1169 Type 2 diabetes mellitus with other specified complication: Secondary | ICD-10-CM

## 2021-06-08 DIAGNOSIS — F331 Major depressive disorder, recurrent, moderate: Secondary | ICD-10-CM | POA: Diagnosis not present

## 2021-06-08 DIAGNOSIS — F3160 Bipolar disorder, current episode mixed, unspecified: Secondary | ICD-10-CM

## 2021-06-08 DIAGNOSIS — E785 Hyperlipidemia, unspecified: Secondary | ICD-10-CM

## 2021-06-17 ENCOUNTER — Ambulatory Visit (INDEPENDENT_AMBULATORY_CARE_PROVIDER_SITE_OTHER): Payer: Medicare Other | Admitting: Licensed Clinical Social Worker

## 2021-06-17 DIAGNOSIS — F3160 Bipolar disorder, current episode mixed, unspecified: Secondary | ICD-10-CM

## 2021-06-17 DIAGNOSIS — E1140 Type 2 diabetes mellitus with diabetic neuropathy, unspecified: Secondary | ICD-10-CM

## 2021-06-17 DIAGNOSIS — I10 Essential (primary) hypertension: Secondary | ICD-10-CM

## 2021-06-17 DIAGNOSIS — F331 Major depressive disorder, recurrent, moderate: Secondary | ICD-10-CM

## 2021-06-17 DIAGNOSIS — F603 Borderline personality disorder: Secondary | ICD-10-CM
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
## 2021-06-17 NOTE — Patient Instructions (Signed)
Visit Information ? ?Thank you for taking time to visit with me today. Please don't hesitate to contact me if I can be of assistance to you before our next scheduled telephone appointment. ? ?Following are the goals we discussed today:  ?Patient Goals/Self-Care Activities: Over the next 120 days ?Attend scheduled medical appointments ?Utilize healthy coping skills discussed and/or supportive resources provided ?Contact PCP office with any questions or concerns ? ?Our next appointment is by telephone on 08/19/21 at 3:00 PM ? ?Please call the care guide team at [phone] if you need to cancel or reschedule your appointment.  ? ?If you are experiencing a Mental Health or [place] or need someone to talk to, please call the Suicide and Crisis Lifeline: 988 ?call 911  ? ?Patient verbalizes understanding of instructions and care plan provided today and agrees to view in [place]. Active MyChart status confirmed with patient.   ? ?Christa See, MSW, LCSW ?[facility] Care Management ?[facility] ?[email or web address] ?Phone [phone] ?10:20 AM ?  ?

## 2021-06-26 ENCOUNTER — Other Ambulatory Visit: Payer: Self-pay | Admitting: Family Medicine

## 2021-06-26 DIAGNOSIS — M62838 Other muscle spasm: Secondary | ICD-10-CM

## 2021-06-27 ENCOUNTER — Other Ambulatory Visit: Payer: Self-pay | Admitting: Family Medicine

## 2021-06-27 DIAGNOSIS — M62838 Other muscle spasm: Secondary | ICD-10-CM

## 2021-06-27 MED ORDER — CYCLOBENZAPRINE HCL 10 MG PO TABS: 10.0000 mg | ORAL_TABLET | Freq: Every day | ORAL | 3 refills | Status: AC | PRN

## 2021-06-27 NOTE — Telephone Encounter (Signed)
Requested medication (s) are due for refill today: yes  Requested medication (s) are on the active medication list: yes  Last refill:  03/11/21  Future visit scheduled: no  Notes to clinic:  med not delegated to NT to RF   Requested Prescriptions  Pending Prescriptions Disp Refills   cyclobenzaprine (FLEXERIL) 10 MG tablet [Pharmacy Med Name: CYCLOBENZAPRINE 10 MG TABLET] 30 tablet 3    Sig: TAKE 1 TABLET BY MOUTH ONCE DAILY AS NEEDED FOR MUSCLE SPASMS     Not Delegated - Analgesics:  Muscle Relaxants Failed - 06/26/2021  8:07 PM      Failed - This refill cannot be delegated      Failed - Valid encounter within last 6 months    Recent Outpatient Visits           6 months ago Encounter for [employer] annual wellness exam   [place], DO   11 months ago Abdominal distention   [place], DO   11 months ago Hypertriglyceridemia   [place], DO   1 year ago Acute non-recurrent frontal sinusitis   [place], Devonne Doughty, DO   1 year ago Type 2 diabetes, controlled, with neuropathy [facility])   [place], Devonne Doughty, DO

## 2021-07-09 DIAGNOSIS — F331 Major depressive disorder, recurrent, moderate: Secondary | ICD-10-CM

## 2021-07-09 DIAGNOSIS — F3160 Bipolar disorder, current episode mixed, unspecified: Secondary | ICD-10-CM

## 2021-07-09 DIAGNOSIS — I10 Essential (primary) hypertension: Secondary | ICD-10-CM

## 2021-07-09 DIAGNOSIS — E1140 Type 2 diabetes mellitus with diabetic neuropathy, unspecified: Secondary | ICD-10-CM

## 2021-07-10 ENCOUNTER — Ambulatory Visit (INDEPENDENT_AMBULATORY_CARE_PROVIDER_SITE_OTHER): Payer: Medicare Other

## 2021-07-10 ENCOUNTER — Telehealth: Payer: Self-pay

## 2021-07-10 ENCOUNTER — Telehealth: Payer: Medicare Other

## 2021-07-10 DIAGNOSIS — F419 Anxiety disorder, unspecified: Secondary | ICD-10-CM

## 2021-07-10 DIAGNOSIS — Z794 Long term (current) use of insulin: Secondary | ICD-10-CM

## 2021-07-10 DIAGNOSIS — E1140 Type 2 diabetes mellitus with diabetic neuropathy, unspecified: Secondary | ICD-10-CM

## 2021-07-10 DIAGNOSIS — E781 Pure hyperglyceridemia: Secondary | ICD-10-CM

## 2021-07-10 DIAGNOSIS — F603 Borderline personality disorder: Secondary | ICD-10-CM

## 2021-07-10 DIAGNOSIS — F3160 Bipolar disorder, current episode mixed, unspecified: Secondary | ICD-10-CM

## 2021-07-10 DIAGNOSIS — I10 Essential (primary) hypertension: Secondary | ICD-10-CM

## 2021-07-10 DIAGNOSIS — F331 Major depressive disorder, recurrent, moderate: Secondary | ICD-10-CM

## 2021-07-10 NOTE — Telephone Encounter (Signed)
  Care Management   Follow Up Note   07/10/2021 Name: Alejandra Frederick MRN: [id number] DOB: 1970/10/22   Referred by: Olin Hauser, DO Reason for referral : Chronic Care Management (RNCM: Follow up for Chronic Disease Management and Care Coordination Needs )   The patient returned call and the [facility] was able to complete the call. See new encounter.   Follow Up Plan: Telephone follow up appointment with care management team member scheduled for: 09-25-2021 at 1145 am  Noreene Larsson RN, MSN, [place] Mobile: [phone]

## 2021-07-10 NOTE — Chronic Care Management (AMB) (Signed)
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 providing education and rationale, to monitor blood pressure daily and record, calling PCP for findings outside established parameters;  Reviewed scheduled/upcoming provider appointments including: no upcoming appointments. Encouraged her to call for changes or new needs Advised patient to discuss HTN medications and cardiac health with provider; Provided education on prescribed diet heart healthy/ADA diet ;  Discussed complications of poorly controlled blood pressure such as heart disease, stroke, circulatory complications, vision complications, kidney impairment, sexual dysfunction;    Hyperlipidemia:  (Status: Goal on track: YES.) Lab Results  Component Value Date   CHOL 118 07/24/2020   HDL 54 07/24/2020   LDLCALC 43 07/24/2020   TRIG 125 07/24/2020   CHOLHDL 2.2 07/24/2020     Medication review performed;  medication list updated in electronic medical record. 07-10-2021: Takes Lipitor 40 mg daily  Provider established cholesterol goals reviewed; Counseled on importance of regular laboratory monitoring as prescribed. 07-10-2021: Review of getting regular lab work done. Will continue to monitor for changes; Provided HLD educational materials; Reviewed role and benefits of statin for ASCVD risk reduction; Discussed strategies to manage statin-induced myalgias; Reviewed  importance of limiting foods high in cholesterol- will send information by email and my Chart and EMMI on healthy foods. 06-02-2021: Review of eating a heart healthy/ADA diet Reviewed exercise goals and target of 150 minutes per week;   Patient Goals/Self-Care Activities: Patient will self administer medications as prescribed Patient will attend all scheduled provider appointments Patient will call pharmacy for medication refills Patient will attend church or other social activities Patient will continue to perform ADL's independently Patient will continue to perform IADL's independently Patient will call provider office for new concerns or questions Patient will work with BSW to address care coordination needs and will continue to work with the clinical team to address health care and disease management related needs.         Plan:Telephone follow up appointment with care management team member scheduled for:  09-25-2021 at 1145 am  Noreene Larsson RN, MSN, [place] [place] Mobile: [phone]

## 2021-07-10 NOTE — Patient Instructions (Signed)
Visit Information  Thank you for taking time to visit with me today. Please don't hesitate to contact me if I can be of assistance to you before our next scheduled telephone appointment.  Following are the goals we discussed today:  Diabetes:  (Status: Goal on track: YES.)      Lab Results  Component Value Date    HGBA1C 6.5 (A) 04/04/2021  Assessed patient's understanding of A1c goal: <7%. 06-02-2021: Knows the goal of A1C Provided education to patient about basic DM disease process. 07-10-2021: The patient is having issues right now with low blood sugars currently. She has been working with her endocrinologist and has an upcoming appointment with the endocrinologist. She is monitoring her blood sugars very closely and has had lows as low as 42. Her average on the Dexcom is 64. Education on keeping sugary snacks available. She has alarms set on the Dexcom and her phone to help her effectively manage; Reviewed medications with patient and discussed importance of medication adherence. 11-25-2020: The patient states that she has not had to take insulin in 2 weeks because her stress level is better and she is actually having low blood sugars. Education on the importance of monitoring blood sugars for changes and discussing medications changes with the provider. 12-30-2020: The patient is working with the specialist to adjust medications. States she is in contact with the provider and she is monitoring her blood sugars closely;  03-03-2021: The patient is having multiple issues with her mental health right now, but did state she is taking her medications as directed. 07-10-2021: The patient is compliant with medications and is having issues with her blood sugars. She has been adjusting her medications and will likely come off of her DM medications completely. The patient states that she lost a lot of weight and it has changed  her DM completely.     Reviewed prescribed diet with patient Heart heatlhy/ADA diet.  12-30-2020: Review of diet and making sure she has adequate dietary intake. States she is eating crackers and has things available for her to eat and knows what to look for in changes in her blood sugars. The patient states that she is eating snacks and monitoring for lows. Her friend is still with her and she is thankful for that support. 04-10-2021: The patient states she has lost weight and is down to 129 pounds. The patient admits that she does not always eat right but she is staying hydrated. She recently was treated for a SBO and that is clear now. She states she does not ever want to feel like that again. Encouraged the patient to eat a heart healthy/ADA diet. 06-02-2021: The patient is having a hard time keeping her blood sugars above 70. The specialist is working with her and she is mindful of the need to eat snacks to keep her blood sugars up. 07-10-2021: The patient states she is watching what she eats and keeping a very close eye on her food intake. She is keeping snacks available.  Counseled on importance of regular laboratory monitoring as prescribed. 07-10-2021: Is compliant with regular lab testing;        Discussed plans with patient for ongoing care management follow up and provided patient with direct contact information for care management team;      Provided patient with written educational materials related to hypo and hyperglycemia and importance of correct treatment. 11-25-2020: States she has had some low readings. 70 to 90 and even into the 30's. States she  has not had to have insulin x 2 weeks. Denies any acute findings. Education given. Will continue to monitor.   12-30-2020: The patient states that the lowest recently was 60. She is down to [place] 12 units now.  04-10-2021: The patient denies any real lows or real highs. Review of rule of 15 at her last endocrinology appointment. The patient is aware of factors that impact her blood sugars. She is doing well today and denies any concerns  with her DM health and well being.   06-02-2021: States that she is having trouble keeping her blood sugars greater than 70. Education and support given. Sees the specialist on a regular basis.  07-10-2021: Continues to have issues with her blood sugars. Review of the rule of 15 and she is watching what she eats and will discuss options with her endocrinologist.  Reviewed scheduled/upcoming provider appointments including: No upcoming appointments with the pcp. Saw MD on 10-15-2020. Knows to call the office for changes. Encouraged follow up with the pcp.  Advised patient, providing education and rationale, to check cbg as directed, has dexcom and record.  07-10-2021: The patient has frequent monitoring with dexcom reader.      call provider for findings outside established parameters;         Anxiety, Depression, and Bipolar  (Status: Goal on Track (progressing): YES.) Lots of stressors going on. Is doing better but will be relocating to a safer location, upcoming court dates. Evaluation of current treatment plan related to Anxiety, Depression, and Bipolar Disorder, Level of care concerns and Mental Health Concerns  self-management and patient's adherence to plan as established by provider. 12-30-2020: The patient is doing good today. She has been in contact on a consistent basis with the court system and working with the DA to make sure she stays safe. Her husband is in jail and she feels safer with him being in jail. That patient denies any acute distress at this time. Is managing well currently. Has support from friends and the CCM team. Knows to call for changes. 03-03-2021: Is not doing well at this time and has a lot of mental health stressors and set backs. Feels like she is not getting the help she needs and her ex-husband has violated 50B orders and she is stuck with all kinds of expenses. Empathetic listening and support. The patient is willing to discuss with the LCSW resources to help as she has to find a  safe place to go. She denies any immediate distress. Says she "just doesn't care anymore", but is willing to discuss her concerns with the LCSW. Will collaborate with the LCSW for a sooner appointment than March with the patient. 04-10-2021: The patient is having a good day today and is getting better. She is on her medications and working with a psychiatrist. She states a month ago she overdosed on her blood pressure medications and a friend had stopped by to see her and they called "paramedics". The patient states things are looking up and she is getting back on track. She had been under a lot of stress with factors presented from her ex-husband. She is in a safe place now and getting the help that she needs. She recently had a SBO and was treated for this. She is doing much better now. Review of staying hydrated and monitoring for changes. She admits that she has a lot going on and is thankful for the support of the CCM team and really caring about her. Discussed at length  how the team is here to support her and help her through the hard times and that her life is important. She denies any suicidal ideations at this time. Agrees to call the CCM team for questions or concerns. Will continue to monitor for changes. 06-02-2021: The patient is having a good day. She states that she is reuniting with her family after 3 years. She was supposed to recently but felt she was not ready. A new date to meet her family at the church has been set for May 12th. She states her spiritual mentors and family will be there. She is talking with her dad by text messaging. She does want to get set up with a new referral for therapy. She does not want the referral to go through [facility], [facility] or [facility]. Will collaborate with the pcp and LCSW for assistance. 07-10-2021: The patient is very optimistic and happy today. She is focusing on herself and is wanting to get her divorce so she can move on with her life. She realizes  that she has people that care about her and she feels very blessed. Continues to work with the [facility] and RNCM for assistance and help.  Discussed plans with patient for ongoing care management follow up and provided patient with direct contact information for care management team Advised patient to call the office for changes in mood, anxiety, and depression.  11-25-2020: The patient states September was a rough month as she had all kinds of issues with her ex-husband. He is now removed from the home and she will have to go to court in December. Did remove Miguelina Fore from her contact information per the patient request. Her chart has been marked as confidential and she has changed her HIPPA forms. The patient states she is getting back on track now and  has a friend that has been helping her. She denies any SI today. She does not feel safe in her current living environment as she does not know what her ex husband will do but she is in constant contact with [facility], the DA, and la[place] enforcement. States she will be relocating to a safe place in November or December.  12-30-2020: The patient is stable in her mental health at this time. Endorses taking medications as prescribed. Denies any acute findings. Review of assistance available. 03-03-2021: The patient was not having a good day today and things have not been going well with her for several months now. Reflective listening and support given. 04-10-2021: The patient is doing well today and is hopeful for the future. She denies any acute distress today. Will continue to monitor. 06-02-2021: The patient is doing mindfulness and a Bible study. She is going to the park and feeling much better. Wants to continue to work with the CCM team. 07-10-2021: The patient continues to remain positive and is doing well. States that she is continuing to do well. She was having issues with her phone but got it fixed earlier in the afternoon.  Provided education to patient re:  working with the CCM team to help meet mental health needs and other chronic condition management ; Reviewed medications with patient and discussed compliance and medication needs. 11-25-2020: States compliance with her medications now. States she was in a bad place a few weeks ago but now is taking her medications as directed. Denies any new concerns today. Is thakful for the support of the CCM team and other resources she has. 07-10-2021:: Is taking medications as  ordered ; Collaborated with pcp and LCSW regarding the need for new psychiatrist ; Social Work referral for mental health needs. The patient is currently working with the [facility]. 11-25-2020: Is working with the LCSW for ongoing support and education.  03-03-2021: Continues to work with the [facility]. Knows of upcoming appointment this week with the LCSW for support and education. 07-10-2021: Ongoing support and education from the LCSW. Discussed plans with patient for ongoing care management follow up and provided patient with direct contact information for care management team; Screening for signs and symptoms of depression related to chronic disease state;  Assessed social determinant of health barriers;    Health Maintenance (Status: Goal on track: YES.)  Patient interviewed about adult health maintenance status including Depression screen    Diabetes Eye Exam    Blood Pressure    Hemoglobin A1c    Diabetes Foot Exam      Advised patient to discuss Depression screen    Regular eye checkups Diabetes Eye Exam    Blood Pressure    Hemoglobin A1c    Diabetes Foot Exam    with primary care provider            Hypertension: (Status: Goal on track: NO.) Last practice recorded BP readings:     BP Readings from Last 3 Encounters:  04/04/21 120/80  10/14/20 (!) 173/79  09/27/20 120/80  Most recent eGFR/CrCl: No results found for: EGFR  No components found for: CRCL   Evaluation of current treatment plan related to hypertension self  management and patient's adherence to plan as established by provider. 11-25-2020: Had some elevations in blood pressure due to stress and factors out of her control. States things have settled down and she is doing better. Is taking her medications as directed now. Will continue to monitor.  12-30-2020: States her blood pressures are better since she is not as stressed out. Did not provide readings. States she knows that she needs to be at peace and stay calm due to how her stress and panic attacks sometimes cause her to not do well with her effective management of her chronic conditions. Will continue to monitor.   03-03-2021: Has a service dog to help with her HTN. The patient is stressed out and when she is stressed out her blood pressures are elevated. Education and support given. Encouraged follow up with pcp.07-10-2021: The patient having more stable blood pressures at this time. Denies any new issues with HTN or heart health.  Provided education to patient re: stroke prevention, s/s of heart attack and stroke; Reviewed prescribed diet Heart Healthy/ADA. 06-02-2021: Review and education given. Encouraged the patient to eat well and monitor for dehydration.  She did have a hospital stay in March but is doing much better now.  Reviewed medications with patient and discussed importance of compliance. 07-10-2021 States compliance with medications at this time;  Advised patient, providing education and rationale, to monitor blood pressure daily and record, calling PCP for findings outside established parameters;  Reviewed scheduled/upcoming provider appointments including: no upcoming appointments. Encouraged her to call for changes or new needs Advised patient to discuss HTN medications and cardiac health with provider; Provided education on prescribed diet heart healthy/ADA diet ;  Discussed complications of poorly controlled blood pressure such as heart disease, stroke, circulatory complications, vision  complications, kidney impairment, sexual dysfunction;      Hyperlipidemia:  (Status: Goal on track: YES.)      Lab Results  Component Value Date  CHOL 118 07/24/2020    HDL 54 07/24/2020    LDLCALC 43 07/24/2020    TRIG 125 07/24/2020    CHOLHDL 2.2 07/24/2020      Medication review performed; medication list updated in electronic medical record. 07-10-2021: Takes Lipitor 40 mg daily  Provider established cholesterol goals reviewed; Counseled on importance of regular laboratory monitoring as prescribed. 07-10-2021: Review of getting regular lab work done. Will continue to monitor for changes; Provided HLD educational materials; Reviewed role and benefits of statin for ASCVD risk reduction; Discussed strategies to manage statin-induced myalgias; Reviewed importance of limiting foods high in cholesterol- will send information by email and my Chart and EMMI on healthy foods. 06-02-2021: Review of eating a heart healthy/ADA diet Reviewed exercise goals and target of 150 minutes per week;   Our next appointment is by telephone on 09-25-2021 at 1145 am  Please call the care guide team at [phone] if you need to cancel or reschedule your appointment.   If you are experiencing a Mental Health or [place] or need someone to talk to, please call the Suicide and Crisis Lifeline: 988 call the [place] National Suicide Prevention Lifeline: 4787495432 or TTY: (209)436-5504 TTY 305-190-2903) to talk to a trained counselor call 1-800-273-TALK (toll free, 24 hour hotline)   Patient verbalizes understanding of instructions and care plan provided today and agrees to view in [facility]. Active MyChart status and patient understanding of how to access instructions and care plan via MyChart confirmed with patient.     Noreene Larsson RN, MSN, [place] Mobile: [phone]

## 2021-07-30 ENCOUNTER — Ambulatory Visit: Payer: Self-pay

## 2021-07-30 DIAGNOSIS — F3160 Bipolar disorder, current episode mixed, unspecified: Secondary | ICD-10-CM

## 2021-07-30 DIAGNOSIS — F419 Anxiety disorder, unspecified: Secondary | ICD-10-CM

## 2021-07-30 DIAGNOSIS — F331 Major depressive disorder, recurrent, moderate: Secondary | ICD-10-CM

## 2021-07-30 DIAGNOSIS — I10 Essential (primary) hypertension: Secondary | ICD-10-CM

## 2021-07-30 NOTE — Chronic Care Management (AMB) (Signed)
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 control. States things have settled down and she is doing better. Is taking her medications as directed now. Will continue to monitor.  12-30-2020: States her blood pressures are better since she is not as stressed out. Did not provide readings. States she knows that she needs to be at peace and stay calm due to how her stress and panic attacks sometimes cause her to not do well with her effective management of her chronic conditions. Will continue to monitor.   03-03-2021: Has a service dog to help with her HTN. The patient is stressed out and when she is stressed out her blood pressures are elevated. Education and support given. Encouraged follow up with pcp.07-10-2021: The patient  having more stable blood pressures at this time. Denies any new issues with HTN or heart health. 07-30-2021: The patient has not taken her blood pressure but is very upset today. Education on focusing on anxiety level and working on relaxation. Emotional support given.  Provided education to patient re: stroke prevention, s/s of heart attack and stroke. 07-30-2021: Review of ill effects of HTN on body systems and increase risk of heart attack and stroke.; Reviewed prescribed diet Heart Healthy/ADA. 06-02-2021: Review and education given. Encouraged the patient to eat well and monitor for dehydration.  She did have a  hospital stay in March but is doing much better now.  Reviewed medications with patient and discussed importance of compliance. 07-10-2021 States compliance with medications at this time;  Advised patient, providing education and rationale, to monitor blood pressure daily and record, calling PCP for findings outside established parameters;  Reviewed scheduled/upcoming provider appointments including: no upcoming appointments. Encouraged her to call for changes or new needs Advised patient to discuss HTN medications and cardiac health with provider; Provided education on prescribed diet heart healthy/ADA diet ;  Discussed complications of poorly controlled blood pressure such as heart disease, stroke, circulatory complications, vision complications, kidney impairment, sexual dysfunction;    Hyperlipidemia:  (Status: Goal on track: YES.) Lab Results  Component Value Date   CHOL 118 07/24/2020   HDL 54 07/24/2020   LDLCALC 43 07/24/2020   TRIG 125 07/24/2020   CHOLHDL 2.2 07/24/2020     Medication review performed; medication list updated in electronic medical record. 07-10-2021: Takes Lipitor 40 mg daily  Provider established cholesterol goals reviewed; Counseled on importance of regular laboratory monitoring as prescribed. 07-10-2021: Review of getting regular lab work done. Will continue to monitor for changes; Provided HLD educational materials; Reviewed role and benefits of statin for ASCVD risk reduction; Discussed strategies to manage statin-induced myalgias; Reviewed importance of limiting foods high in cholesterol- will send information by email and my Chart and EMMI on healthy foods. 06-02-2021: Review of eating a heart healthy/ADA diet Reviewed exercise goals and target of 150 minutes per week;   Patient Goals/Self-Care Activities: Patient will self administer medications as prescribed Patient will attend all scheduled provider appointments Patient will call pharmacy for medication  refills Patient will attend church or other social activities Patient will continue to perform ADL's independently Patient will continue to perform IADL's independently Patient will call provider office for new concerns or questions Patient will work with BSW to address care coordination needs and will continue to work with the clinical team to address health care and disease management related needs.         Plan:Telephone follow up appointment with care management team member scheduled for:  as scheduled  Noreene Larsson RN, MSN, [facility] Mobile: [phone]
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
## 2021-08-08 DIAGNOSIS — F319 Bipolar disorder, unspecified: Secondary | ICD-10-CM

## 2021-08-08 DIAGNOSIS — E1159 Type 2 diabetes mellitus with other circulatory complications: Secondary | ICD-10-CM

## 2021-08-08 DIAGNOSIS — E785 Hyperlipidemia, unspecified: Secondary | ICD-10-CM | POA: Diagnosis not present

## 2021-08-08 DIAGNOSIS — Z794 Long term (current) use of insulin: Secondary | ICD-10-CM

## 2021-08-08 DIAGNOSIS — I10 Essential (primary) hypertension: Secondary | ICD-10-CM | POA: Diagnosis not present

## 2021-08-08 DIAGNOSIS — F17210 Nicotine dependence, cigarettes, uncomplicated: Secondary | ICD-10-CM

## 2021-08-19 ENCOUNTER — Ambulatory Visit: Payer: Medicare Other | Admitting: Licensed Clinical Social Worker

## 2021-08-19 DIAGNOSIS — F3160 Bipolar disorder, current episode mixed, unspecified: Secondary | ICD-10-CM

## 2021-08-19 DIAGNOSIS — E1140 Type 2 diabetes mellitus with diabetic neuropathy, unspecified: Secondary | ICD-10-CM

## 2021-08-19 DIAGNOSIS — F419 Anxiety disorder, unspecified: Secondary | ICD-10-CM

## 2021-08-19 DIAGNOSIS — F603 Borderline personality disorder: Secondary | ICD-10-CM

## 2021-08-19 DIAGNOSIS — F331 Major depressive disorder, recurrent, moderate: Secondary | ICD-10-CM
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
## 2021-08-22 NOTE — Patient Instructions (Signed)
Visit Information  Thank you for taking time to visit with me today. Please don't hesitate to contact me if I can be of assistance to you before our next scheduled telephone appointment.  Following are the goals we discussed today:  Patient Goals/Self-Care Activities: Over the next 120 days Attend scheduled medical appointments Utilize healthy coping skills discussed and/or supportive resources provided Contact PCP office with any questions or concerns  Pt will be followed up through Care Coordination  If you are experiencing a [place] or [place] or need someone to talk to, please call the Suicide and Crisis Lifeline: 988 call 911   Patient verbalizes understanding of instructions and care plan provided today and agrees to view in [place]. Active MyChart status and patient understanding of how to access instructions and care plan via MyChart confirmed with patient.

## 2021-08-25 ENCOUNTER — Other Ambulatory Visit: Payer: Self-pay | Admitting: Internal Medicine

## 2021-09-25 ENCOUNTER — Ambulatory Visit: Payer: Self-pay

## 2021-09-25 ENCOUNTER — Telehealth: Payer: Medicare Other

## 2021-09-25 NOTE — Patient Outreach (Signed)
  Care Coordination   Follow Up Visit Note   09/25/2021 Name: Alejandra Frederick MRN: [id number] DOB: Jun 28, 1970  Alejandra Frederick is a 51 y.o. year old female who sees Tracie Harrier, MD for primary care. I spoke with  [name] by phone today  What matters to the patients health and wellness today?  Blood sugars are up and down and she has the dexcom 7, does not feel like it is correct. Sees endocrinologist next week.     Goals Addressed             This Visit's Progress    RNCM: Effective management of DM       Care Coordination Interventions: Provided education to patient about basic DM disease process Reviewed medications with patient and discussed importance of medication adherence Counseled on importance of regular laboratory monitoring as prescribed Discussed plans with patient for ongoing care management follow up and provided patient with direct contact information for care management team Provided patient with written educational materials related to hypo and hyperglycemia and importance of correct treatment. 09-25-2021: The patient is having up and down readings. Extensive education given to the patient  today about doing finger sticks and making sure she keeps a close eye out on things since she is having such erratic readings. Education on taking the meter and device with her to the appointment next week. Reviewed scheduled/upcoming provider appointments including: 10-02-2021 with the endocrinologist Advised patient, providing education and rationale, to check cbg has dexcom 7 and having different readings due to weight loss and record, calling endocrinologist/pcp for findings outside established parameters Review of patient status, including review of consultants reports, relevant laboratory and other test results, and medications completed Screening for signs and symptoms of depression related to chronic disease state  Assessed social determinant of health barriers The  patient has changed her pcp to [facility]. Ask for the record to be updated.            SDOH assessments and interventions completed:  Yes  SDOH Interventions Today    Flowsheet Row Most Recent Value  SDOH Interventions   Food Insecurity Interventions Intervention Not Indicated  Housing Interventions Intervention Not Indicated  Transportation Interventions Intervention Not Indicated        Care Coordination Interventions Activated:  Yes  Care Coordination Interventions:  Yes, provided   Follow up plan: Follow up call scheduled for 12-04-2021 at 1130 am    Encounter Outcome:  Pt. Visit Completed   Noreene Larsson RN, MSN, [place] [facility] Mobile: [phone]

## 2021-09-25 NOTE — Patient Instructions (Signed)
Visit Information  Thank you for taking time to visit with me today. Please don't hesitate to contact me if I can be of assistance to you.   Following are the goals we discussed today:   Goals Addressed             This Visit's Progress    RNCM: Effective management of DM       Care Coordination Interventions: Provided education to patient about basic DM disease process Reviewed medications with patient and discussed importance of medication adherence Counseled on importance of regular laboratory monitoring as prescribed Discussed plans with patient for ongoing care management follow up and provided patient with direct contact information for care management team Provided patient with written educational materials related to hypo and hyperglycemia and importance of correct treatment. 09-25-2021: The patient is having up and down readings. Extensive education given to the patient  today about doing finger sticks and making sure she keeps a close eye out on things since she is having such erratic readings. Education on taking the meter and device with her to the appointment next week. Reviewed scheduled/upcoming provider appointments including: 10-02-2021 with the endocrinologist Advised patient, providing education and rationale, to check cbg has dexcom 7 and having different readings due to weight loss and record, calling endocrinologist/pcp for findings outside established parameters Review of patient status, including review of consultants reports, relevant laboratory and other test results, and medications completed Screening for signs and symptoms of depression related to chronic disease state  Assessed social determinant of health barriers The patient has changed her pcp to [facility] Ask for the record to be updated.            Our next appointment is by telephone on 12-04-2021  at 1130 am  Please call the care guide team at [phone] if you need to cancel or reschedule your  appointment.   If you are experiencing a Mental Health or [place] or need someone to talk to, please call the Suicide and Crisis Lifeline: 988 call the [place] National Suicide Prevention Lifeline: (813)605-0262 or TTY: (573) 361-7816 TTY (787) 261-8014) to talk to a trained counselor call 1-800-273-TALK (toll free, 24 hour hotline)  Patient verbalizes understanding of instructions and care plan provided today and agrees to view in [place]. Active MyChart status and patient understanding of how to access instructions and care plan via MyChart confirmed with patient.     Telephone follow up appointment with care management team member scheduled for: 12-04-2021 at 19 am  [place], MSN, [place] [facility] Mobile: [phone]

## 2021-10-03 ENCOUNTER — Ambulatory Visit: Payer: Medicare Other | Admitting: Internal Medicine

## 2021-10-03 ENCOUNTER — Encounter: Payer: Self-pay | Admitting: Internal Medicine

## 2021-10-03 NOTE — Progress Notes (Deleted)
Name: Alejandra Frederick  Age/ Sex: 51 y.o., female   MRN/ DOB: [id number], October 26, 1970     PCP: Tracie Harrier, MD   Reason for Endocrinology Evaluation: Type 2 Diabetes Mellitus  Initial Endocrine Consultative Visit:  04/04/2020    PATIENT IDENTIFIER: Alejandra Frederick is a 51 y.o. female with a past medical history of T2DM, Bipolar disorder, HTN , and Dyslipidemia.. The patient has followed with Endocrinology clinic since 04/04/2020 for consultative assistance with management of her diabetes.  DIABETIC HISTORY:  Alejandra Frederick was diagnosed with DM at age 29, this started as gestational diabetes.  She is intolerant to metformin due to GI side effects, she has also been tried on Ozempic/Trulicity as well as Jardiance. Her hemoglobin A1c has ranged from 7.5% in 2018, peaking at 7.6% in 2020.  On her initial visit to our clinic she had an A1c of 8.5%, she was on basal insulin as well as using NovoLog per sliding scale, we provided her with a correction scale, continue [place] and started her on Farxiga. SUBJECTIVE:   During the last visit (03/31/2021): A1c 6.5 % no changes were made     Today (10/03/2021): Alejandra Frederick is here for a follow up on diabetes management.  She checks her blood sugars multiple  times through CGM. The patient has not had hypoglycemic episodes since the last clinic visit.   She had an ED visit for hypovolemic shock in March 2023    HOME DIABETES REGIMEN:  Tresiba 16  units daily CF : NovoLog ( Bg-170/40)    Statin: yes ACE-I/ARB: yes     CONTINUOUS GLUCOSE MONITORING RECORD INTERPRETATION  : Unable to download  DIABETIC COMPLICATIONS: Microvascular complications:  CKD III Denies: retinopathy, neuropathy Last Eye Exam: Completed 08/22/2021  Macrovascular complications:   Denies: CAD, CVA, PVD   HISTORY:  Past Medical History:  Past Medical History:  Diagnosis Date   Anxiety    Asthma    Cervical cancer ([place]) 2007   surgical resection to freeze  cells.    Complication of anesthesia    hard to wake up    Depression    Gastroparesis    Headache    High cholesterol    Hypertension    Myocardial infarction [facility]) 2016   Pulmonary embolus ([place])    Stroke ([place]) 2013   Past Surgical History:  Past Surgical History:  Procedure Laterality Date   COLONOSCOPY WITH PROPOFOL N/A 08/22/2020   Procedure: COLONOSCOPY WITH PROPOFOL;  Surgeon: Lin Landsman, MD;  Location: [facility] ENDOSCOPY;  Service: Gastroenterology;  Laterality: N/A;   CYSTOCELE REPAIR N/A 10/03/2018   Procedure: Anterior Colporrhaphy and Culdoplasty;  Surgeon: Ward, Honor Loh, MD;  Location: [facility] ORS;  Service: Gynecology;  Laterality: N/A;   LAPAROSCOPIC HYSTERECTOMY N/A 10/03/2018   Procedure: HYSTERECTOMY TOTAL LAPAROSCOPIC, bilateral Salpingectomy;  Surgeon: Ward, Honor Loh, MD;  Location: [facility] ORS;  Service: Gynecology;  Laterality: N/A;   TONSILLECTOMY     TUBAL LIGATION     Social History:  reports that she has been smoking cigarettes. She has been smoking an average of .25 packs per day. She has never used smokeless tobacco. She reports that she does not currently use drugs. She reports that she does not drink alcohol. Family History:  Family History  Problem Relation Age of Onset   Multiple myeloma Mother 81   Bipolar disorder Mother    Diabetes Mother    Heart disease Father    Bipolar disorder Father    Multiple myeloma Sister  34   Bipolar disorder Sister    Prostate cancer Paternal Grandfather      HOME MEDICATIONS: Allergies as of 10/03/2021       Reactions   Bee Venom Anaphylaxis   Ciprofloxacin Anaphylaxis   Iodinated Contrast Media Swelling   Facial swelling  Fingers red and swelling  Facial swelling  Fingers red and swelling    Zofran [ondansetron Hcl] Anaphylaxis   Swelling of the throat and redness of the face   Lactose Intolerance (gi) Diarrhea   Upset stomach   Olanzapine Nausea Only, Other (See Comments)   Seizure like  activity Seizure like activity        Medication List        Accurate as of October 03, 2021  7:32 AM. If you have any questions, ask your nurse or doctor.          albuterol 108 (90 Base) MCG/ACT inhaler Commonly known as: VENTOLIN HFA INHALE 1 TO 2 PUFFS INTO THE LUNGS EVERY 6 HOURS AS NEEDED FOR SHORTNESS OF BREATH   ANTI-ITCH EXTRA STRENGTH EX Apply 1 application topically 4 (four) times daily as needed (itching.).   atorvastatin 40 MG tablet Commonly known as: LIPITOR Take 1 tablet (40 mg total) by mouth daily.   BD Pen Needle Nano U/F 32G X 4 MM Misc Generic drug: Insulin Pen Needle 1 DEVICE BY DOES NOT APPLY ROUTE IN THE MORNING, AT NOON, IN THE EVENING, AND AT BEDTIME.   clonazePAM 1 MG tablet Commonly known as: KLONOPIN Take 1 mg by mouth 2 (two) times daily.   cloNIDine 0.3 MG tablet Commonly known as: CATAPRES Take 1 tablet (0.3 mg total) by mouth 2 (two) times daily.   cyclobenzaprine 10 MG tablet Commonly known as: FLEXERIL Take 1 tablet (10 mg total) by mouth daily as needed for muscle spasms.   Dexcom G6 Sensor Misc Inject 1 Device into the skin as directed.   Dexcom G6 Transmitter Misc Inject 1 Device into the skin as directed.   divalproex 125 MG DR tablet Commonly known as: DEPAKOTE Take 125 mg by mouth 2 (two) times daily.   fluconazole 150 MG tablet Commonly known as: DIFLUCAN Take one tablet by mouth on Day 1. Repeat dose 2nd tablet on Day 3.   FLUoxetine 20 MG capsule Commonly known as: PROZAC Take by mouth.   fluticasone-salmeterol 250-50 MCG/ACT Aepb Commonly known as: ADVAIR TAKE 1 PUFF BY MOUTH TWICE A DAY   InPen 100-Pink-Novolog-Fiasp Devi Generic drug: injection device for insulin USE AS DIRECTED ONCE (PINK PER PT) (NONFORMULARY)   insulin aspart cartridge Commonly known as: NOVOLOG Max daily 25 units   ketorolac 30 MG/ML injection Commonly known as: TORADOL Inject 30 mg into the muscle daily as needed (migraine  headaches.).   Linzess 145 MCG Caps capsule Generic drug: linaclotide TAKE 1 CAPSULE BY MOUTH DAILY BEFORE BREAKFAST.   losartan 50 MG tablet Commonly known as: COZAAR Take by mouth.   methylphenidate 10 MG tablet Commonly known as: RITALIN 3 tabs in AM and 3 tabs in PM   metoprolol succinate 50 MG 24 hr tablet Commonly known as: TOPROL-XL Take 1.5 tablets (75 mg total) by mouth daily. Take with or immediately following a meal.   OneTouch Delica Plus [id number] Misc 1 Device by Does not apply route as directed.   OneTouch Verio test strip Generic drug: glucose blood 1 each by Other route in the morning, at noon, in the evening, and at bedtime. Use as instructed  prazosin 5 MG capsule Commonly known as: MINIPRESS Take 10 mg by mouth at bedtime.   Tyler Aas FlexTouch 100 UNIT/ML FlexTouch Pen Generic drug: insulin degludec Inject 16 Units into the skin daily.   Visine-A 0.025-0.3 % ophthalmic solution Generic drug: naphazoline-pheniramine Place 1 drop into both eyes 4 (four) times daily as needed for eye irritation.         OBJECTIVE:   Vital Signs: LMP 09/11/2018 (Exact Date)   Wt Readings from Last 3 Encounters:  04/04/21 141 lb (64 kg)  10/14/20 172 lb (78 kg)  09/27/20 176 lb 12.8 oz (80.2 kg)     Exam: General: Pt appears well and is in NAD  Lungs: Clear with good BS bilat with no rales, rhonchi, or wheezes  Heart: RRR  Abdomen: Normoactive bowel sounds, soft, nontender, without masses or organomegaly palpable  Extremities: No pretibial edema.   Neuro: MS is good with appropriate affect, pt is alert and Ox3    DM foot exam: 04/04/2020   The skin of the feet is intact without sores or ulcerations. The pedal pulses are 2+ on right and 2+ on left. The sensation is intact to a screening 5.07, 10 gram monofilament bilaterally     DATA REVIEWED:  Lab Results  Component Value Date   HGBA1C 6.5 (A) 04/04/2021   HGBA1C 8.8 (A) 09/27/2020   HGBA1C 9.4  (A) 07/01/2020    Latest Reference Range & Units 10/14/20 00:16  Sodium 135 - 145 mmol/L 137  Potassium 3.5 - 5.1 mmol/L 3.7  Chloride 98 - 111 mmol/L 106  CO2 22 - 32 mmol/L 23  Glucose 70 - 99 mg/dL 158 (H)  BUN 6 - 20 mg/dL 14  Creatinine 0.44 - 1.00 mg/dL 0.91  Calcium 8.9 - 10.3 mg/dL 8.9  Anion gap 5 - 15  8     In office BG 108 mg/DL  ASSESSMENT / PLAN / RECOMMENDATIONS:   1) Type 2 Diabetes Mellitus, optimally controlled, With neuropathic complications - Most recent A1c of 6.5 %. Goal A1c < 7.0%.   -A1c is down from 8.8% to 6.5% -I have praised the patient on improved glycemic control as well as weight loss, she was encouraged to continue with lifestyle changes -She is intolerant to metformin -She has tried Ozempic and Trulicity with persistent hyperglycemia -She had stopped [place] due to hypoglycemia -We were unable to download her CGM nor the InPen  -Patient with fear of hypoglycemia    MEDICATIONS: - Continue Tresiba 16 units daily  - CF: NovoLog (BG -170/40)   EDUCATION / INSTRUCTIONS: BG monitoring instructions: Patient is instructed to check her blood sugars 3 times a day, breakfast lunch dinner times. Call [place] [facility] clinic if: BG persistently < 70 I reviewed the Rule of 15 for the treatment of hypoglycemia in detail with the patient. Literature supplied.     2) Diabetic complications:  Eye: Does not have known diabetic retinopathy.  Neuro/ Feet: Does not have known diabetic peripheral neuropathy .  Renal: Patient did  have known baseline CKD. She is on an ACEI/ARB at present.  She follows with [facility] nephrology.  Patient her GFR is above 35, ~in the 50s, repeat in 10/2020 normalized to 60   3) Hypertriglyceridemia:    - Tg as high as 551 mg/dL in 2018.  Repeat in 07/2020 through PCP's office normal at 125 mg/dL     - Patient is on Atorvastatin 40 mg daily    F/U in 4 months  Signed electronically by: Mack Guise, MD  [facility] Endocrinology  [facility] [address] [place], [place] [address] Phone: [phone] FAX: [phone]   CC: Tracie Harrier, [place] [address] Phone: [phone]  Fax: [phone]  Return to Endocrinology clinic as below: Future Appointments  Date Time Provider [place]  10/03/2021  2:20 PM Auria Mckinlay, Melanie Crazier, MD [facility] None  10/15/2021  3:00 PM Christa See D, LCSW [facility] None  12/04/2021 11:30 AM Vanita Ingles, RN [facility] None

## 2021-10-15 ENCOUNTER — Encounter: Payer: Self-pay | Admitting: Licensed Clinical Social Worker

## 2021-10-20 ENCOUNTER — Other Ambulatory Visit: Payer: Self-pay | Admitting: Family Medicine

## 2021-10-20 DIAGNOSIS — M62838 Other muscle spasm: Secondary | ICD-10-CM

## 2021-10-21 NOTE — Telephone Encounter (Signed)
Requested medications are due for refill today.  unsure  Requested medications are on the active medications list.  yes  Last refill. 06/27/2021 #30 3 refills  Future visit scheduled.   no  Notes to clinic.  PCP is Vishwanath Hande.    Requested Prescriptions  Pending Prescriptions Disp Refills   cyclobenzaprine (FLEXERIL) 10 MG tablet [Pharmacy Med Name: CYCLOBENZAPRINE 10 MG TABLET] 30 tablet 3    Sig: TAKE 1 TABLET BY MOUTH DAILY AS NEEDED FOR MUSCLE SPASMS.     Not Delegated - Analgesics:  Muscle Relaxants Failed - 10/20/2021  1:00 PM      Failed - This refill cannot be delegated      Failed - Valid encounter within last 6 months    Recent Outpatient Visits           10 months ago Encounter for [employer] annual wellness exam   [place], DO   1 year ago Abdominal distention   [place], DO   1 year ago Hypertriglyceridemia   [place], DO   1 year ago Acute non-recurrent frontal sinusitis   [facility] Olin Hauser, DO   1 year ago Type 2 diabetes, controlled, with neuropathy [facility])   [facility], Devonne Doughty, DO

## 2021-10-22 ENCOUNTER — Telehealth: Payer: Self-pay | Admitting: Internal Medicine

## 2021-10-22 NOTE — Telephone Encounter (Signed)
Patient called and is requesting that progress notes and RX for G7 sensors be faxed to [employer] back [phone]

## 2021-10-23 NOTE — Telephone Encounter (Signed)
Paper work has been faxed
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
## 2021-11-04 ENCOUNTER — Ambulatory Visit: Payer: Medicare Other | Admitting: Internal Medicine

## 2021-11-10 ENCOUNTER — Ambulatory Visit: Payer: Medicare Other | Admitting: Internal Medicine

## 2021-11-10 ENCOUNTER — Ambulatory Visit (INDEPENDENT_AMBULATORY_CARE_PROVIDER_SITE_OTHER): Payer: Medicare Other | Admitting: Internal Medicine

## 2021-11-10 ENCOUNTER — Encounter: Payer: Self-pay | Admitting: Internal Medicine

## 2021-11-10 VITALS — BP 104/78 | HR 70 | Ht 63.0 in | Wt 131.2 lb

## 2021-11-10 DIAGNOSIS — E1140 Type 2 diabetes mellitus with diabetic neuropathy, unspecified: Secondary | ICD-10-CM | POA: Diagnosis not present

## 2021-11-10 LAB — POCT GLYCOSYLATED HEMOGLOBIN (HGB A1C): Hemoglobin A1C: 5.4 % (ref 4.0–5.6)

## 2021-11-10 MED ORDER — TRESIBA FLEXTOUCH 100 UNIT/ML ~~LOC~~ SOPN: 14.0000 [IU] | PEN_INJECTOR | Freq: Every day | SUBCUTANEOUS | 3 refills | Status: DC

## 2021-11-10 MED ORDER — DEXCOM G7 SENSOR MISC
1.0000 | 3 refills | Status: AC
Start: 2021-11-10 — End: ?

## 2021-11-10 NOTE — Progress Notes (Signed)
Name: Alejandra Frederick  Age/ Sex: 52 y.o., female   MRN/ DOB: [id number], 1970-03-21     PCP: Alejandra Harrier, MD   Reason for Endocrinology Evaluation: Type 2 Diabetes Mellitus  Initial Endocrine Consultative Visit:  04/04/2020    PATIENT IDENTIFIER: [name] is a 51 y.o. female with a past medical history of T2DM, Bipolar disorder, HTN , and Dyslipidemia.. The patient has followed with Endocrinology clinic since 04/04/2020 for consultative assistance with management of her diabetes.  DIABETIC HISTORY:  Alejandra Frederick was diagnosed with DM at age 52, this started as gestational diabetes.  She is intolerant to metformin due to GI side effects, she has also been tried on Ozempic/Trulicity as well as Jardiance. Her hemoglobin A1c has ranged from 7.5% in 2018, peaking at 7.6% in 2020.  On her initial visit to our clinic she had an A1c of 8.5%, she was on basal insulin as well as using NovoLog per sliding scale, we provided her with a correction scale, continue [place] and started her on Farxiga. SUBJECTIVE:   During the last visit (09/27/2020): A1c 8.8 % Increased tresiba, and Farxiga and continued Novolog per Correction scale      Today (11/10/2021): Alejandra Frederick is here for a follow up on diabetes management.  She checks her blood sugars multiple  times through CGM. The patient has had hypoglycemic episodes since the last clinic visit.  The patient is symptomatic with these episodes   Her new implant is blue in color, and she is concerned that she may get the [place] and the NovoLog mix together, she is going to use in the polish on one of them to differentiate Appetite has been variable  Se continues going one safe house to the other .     HOME DIABETES REGIMEN:  Tresiba 16-22   units daily Novolog 2 unit TIAQAC CF : NovoLog ( Bg-130/40)       Statin: yes ACE-I/ARB: yes   CONTINUOUS GLUCOSE MONITORING RECORD INTERPRETATION    Dates of Recording: 9/19-10/03/2021  Sensor  description:dexcom   Results statistics:   CGM use % of time 100  Average and SD 119/32  Time in range    94 %  % Time Above 180 4  % Time above 250 <1  % Time Below target 1   Glycemic patterns summary: BG's optimal during the day and night  Hyperglycemic episodes rarely-postprandial  Hypoglycemic episodes occurred at night  Overnight periods: Trends down   In Pen report 9/12- 11/03/2021 Average 111 mg/DL Missed doses 15    DIABETIC COMPLICATIONS: Microvascular complications:  CKD III Denies: retinopathy, neuropathy Last Eye Exam: Completed 2021  Macrovascular complications:   Denies: CAD, CVA, PVD   HISTORY:  Past Medical History:  Past Medical History:  Diagnosis Date   Anxiety    Asthma    Cervical cancer ([place]) 2007   surgical resection to freeze cells.    Complication of anesthesia    hard to wake up    Depression    Gastroparesis    Headache    High cholesterol    Hypertension    Myocardial infarction [facility]) 2016   Pulmonary embolus ([place])    Stroke ([place]) 2013   Past Surgical History:  Past Surgical History:  Procedure Laterality Date   COLONOSCOPY WITH PROPOFOL N/A 08/22/2020   Procedure: COLONOSCOPY WITH PROPOFOL;  Surgeon: Lin Landsman, MD;  Location: [facility] ENDOSCOPY;  Service: Gastroenterology;  Laterality: N/A;   CYSTOCELE REPAIR N/A 10/03/2018   Procedure:  Anterior Colporrhaphy and Culdoplasty;  Surgeon: Ward, Honor Loh, MD;  Location: [facility] ORS;  Service: Gynecology;  Laterality: N/A;   LAPAROSCOPIC HYSTERECTOMY N/A 10/03/2018   Procedure: HYSTERECTOMY TOTAL LAPAROSCOPIC, bilateral Salpingectomy;  Surgeon: Ward, Honor Loh, MD;  Location: [facility] ORS;  Service: Gynecology;  Laterality: N/A;   TONSILLECTOMY     TUBAL LIGATION     Social History:  reports that she has been smoking cigarettes. She has been smoking an average of .25 packs per day. She has never used smokeless tobacco. She reports that she does not currently use drugs. She  reports that she does not drink alcohol. Family History:  Family History  Problem Relation Age of Onset   Multiple myeloma Mother 88   Bipolar disorder Mother    Diabetes Mother    Heart disease Father    Bipolar disorder Father    Multiple myeloma Sister 18   Bipolar disorder Sister    Prostate cancer Paternal Grandfather      HOME MEDICATIONS: Allergies as of 11/10/2021       Reactions   Bee Venom Anaphylaxis   Ciprofloxacin Anaphylaxis   Iodinated Contrast Media Swelling   Facial swelling  Fingers red and swelling  Facial swelling  Fingers red and swelling    Zofran [ondansetron Hcl] Anaphylaxis   Swelling of the throat and redness of the face   Lactose Intolerance (gi) Diarrhea   Upset stomach   Olanzapine Nausea Only, Other (See Comments)   Seizure like activity Seizure like activity        Medication List        Accurate as of November 10, 2021 12:06 PM. If you have any questions, ask your nurse or doctor.          albuterol 108 (90 Base) MCG/ACT inhaler Commonly known as: VENTOLIN HFA INHALE 1 TO 2 PUFFS INTO THE LUNGS EVERY 6 HOURS AS NEEDED FOR SHORTNESS OF BREATH   ANTI-ITCH EXTRA STRENGTH EX Apply 1 application topically 4 (four) times daily as needed (itching.).   atorvastatin 40 MG tablet Commonly known as: LIPITOR Take 1 tablet (40 mg total) by mouth daily.   BD Pen Needle Nano U/F 32G X 4 MM Misc Generic drug: Insulin Pen Needle 1 DEVICE BY DOES NOT APPLY ROUTE IN THE MORNING, AT NOON, IN THE EVENING, AND AT BEDTIME.   clonazePAM 1 MG tablet Commonly known as: KLONOPIN Take 1 mg by mouth 2 (two) times daily.   cloNIDine 0.3 MG tablet Commonly known as: CATAPRES Take 1 tablet (0.3 mg total) by mouth 2 (two) times daily.   cyclobenzaprine 10 MG tablet Commonly known as: FLEXERIL Take 1 tablet (10 mg total) by mouth daily as needed for muscle spasms.   Dexcom G7 Sensor Misc by Does not apply route.   Dexcom G6 Sensor Misc Inject 1  Device into the skin as directed.   Dexcom G6 Transmitter Misc Inject 1 Device into the skin as directed.   divalproex 125 MG DR tablet Commonly known as: DEPAKOTE Take 125 mg by mouth 2 (two) times daily.   fluconazole 150 MG tablet Commonly known as: DIFLUCAN Take one tablet by mouth on Day 1. Repeat dose 2nd tablet on Day 3.   FLUoxetine 20 MG capsule Commonly known as: PROZAC Take by mouth.   fluticasone-salmeterol 250-50 MCG/ACT Aepb Commonly known as: ADVAIR TAKE 1 PUFF BY MOUTH TWICE A DAY   InPen 100-Pink-Novolog-Fiasp Devi Generic drug: injection device for insulin USE AS DIRECTED ONCE (PINK PER  PT) (NONFORMULARY) What changed: See the new instructions.   insulin aspart cartridge Commonly known as: NOVOLOG Max daily 25 units   ketorolac 30 MG/ML injection Commonly known as: TORADOL Inject 30 mg into the muscle daily as needed (migraine headaches.).   Linzess 145 MCG Caps capsule Generic drug: linaclotide TAKE 1 CAPSULE BY MOUTH DAILY BEFORE BREAKFAST.   losartan 50 MG tablet Commonly known as: COZAAR Take by mouth.   methylphenidate 10 MG tablet Commonly known as: RITALIN 3 tabs in AM and 3 tabs in PM   metoprolol succinate 50 MG 24 hr tablet Commonly known as: TOPROL-XL Take 1.5 tablets (75 mg total) by mouth daily. Take with or immediately following a meal.   OneTouch Delica Plus [id number] Misc 1 Device by Does not apply route as directed.   OneTouch Verio test strip Generic drug: glucose blood 1 each by Other route in the morning, at noon, in the evening, and at bedtime. Use as instructed   prazosin 5 MG capsule Commonly known as: MINIPRESS Take 10 mg by mouth at bedtime.   Tyler Aas FlexTouch 100 UNIT/ML FlexTouch Pen Generic drug: insulin degludec Inject 16 Units into the skin daily. What changed: additional instructions   Visine-A 0.025-0.3 % ophthalmic solution Generic drug: naphazoline-pheniramine Place 1 drop into both eyes 4 (four)  times daily as needed for eye irritation.         OBJECTIVE:   Vital Signs: BP 104/78 (BP Location: Left Arm, Patient Position: Sitting, Cuff Size: Normal)   Pulse 70   Ht _0  (1.6 m)   Wt 131 lb 3.2 oz (59.5 kg)   LMP 09/11/2018 (Exact Date)   SpO2 96%   BMI 23.24 kg/m   Wt Readings from Last 3 Encounters:  11/10/21 131 lb 3.2 oz (59.5 kg)  04/04/21 141 lb (64 kg)  10/14/20 172 lb (78 kg)     Exam: General: Pt appears well and is in NAD  Lungs: Clear with good BS bilat with no rales, rhonchi, or wheezes  Heart: RRR  Extremities: No pretibial edema.   Neuro: MS is good with appropriate affect, pt is alert and Ox3    DM foot exam: 11/10/2021   The skin of the feet is intact without sores or ulcerations. The pedal pulses are 2+ on right and 2+ on left. The sensation is intact to a screening 5.07, 10 gram monofilament bilaterally     DATA REVIEWED:  Lab Results  Component Value Date   HGBA1C 6.5 (A) 04/04/2021   HGBA1C 8.8 (A) 09/27/2020   HGBA1C 9.4 (A) 07/01/2020    Latest Reference Range & Units 10/14/20 00:16  Sodium 135 - 145 mmol/L 137  Potassium 3.5 - 5.1 mmol/L 3.7  Chloride 98 - 111 mmol/L 106  CO2 22 - 32 mmol/L 23  Glucose 70 - 99 mg/dL 158 (H)  BUN 6 - 20 mg/dL 14  Creatinine 0.44 - 1.00 mg/dL 0.91  Calcium 8.9 - 10.3 mg/dL 8.9  Anion gap 5 - 15  8    ASSESSMENT / PLAN / RECOMMENDATIONS:   1) Type 2 Diabetes Mellitus, optimally controlled, With neuropathic complications - Most recent A1c of 5.4 %. Goal A1c < 7.0%.   -Patient with recurrent hypoglycemic episodes, hence A1c 5.4% -She tends to adjust her basal insulin between 16-22 units, I have advised the patient against increasing basal insulin for hyperglycemia, as this tends to increase her risk of hypoglycemia.  Advised to use the InPen/NovoLog to correct for high glucose readings -  She is intolerant to metformin -She has tried Ozempic and Trulicity with persistent hyperglycemia -She  had stopped [place] due to hypoglycemia -I will reduce her basal insulin as below -I have also reduced her prandial dose of insulin as below   MEDICATIONS: -Decrease Tresiba 14 units daily  -Decrease NovoLog 1 unit 3 times daily before every meal -Continue correction factor: NovoLog (BG -130/40)   EDUCATION / INSTRUCTIONS: BG monitoring instructions: Patient is instructed to check her blood sugars 3 times a day, breakfast lunch dinner times. Call [place] [facility] if: BG persistently < 70 I reviewed the Rule of 15 for the treatment of hypoglycemia in detail with the patient. Literature supplied.     2) Diabetic complications:  Eye: Does not have known diabetic retinopathy.  Neuro/ Feet: Does not have known diabetic peripheral neuropathy .  Renal: Patient did  have known baseline CKD. She is on an ACEI/ARB at present.  She follows with [facility] nephrology.     F/U in 6 months   Signed electronically by: Mack Guise, MD  [facility] Endocrinology  [facility] [address] [place], [place] [address] Phone: [phone] FAX: [phone]   CC: Alejandra Frederick, [address] [place] [address] Phone: [phone]  Fax: [phone]  Return to Endocrinology clinic as below: Future Appointments  Date Time Provider [place]  12/04/2021 11:30 AM Vanita Ingles, RN [facility] None

## 2021-11-10 NOTE — Patient Instructions (Addendum)
-   Decrease  Tresiba 14 units daily    HOW TO TREAT LOW BLOOD SUGARS (Blood sugar LESS THAN 70 MG/DL) Please follow the RULE OF 15 for the treatment of hypoglycemia treatment (when your (blood sugars are less than 70 mg/dL)   STEP 1: Take 15 grams of carbohydrates when your blood sugar is low, which includes:  3-4 GLUCOSE TABS  OR 3-4 OZ OF JUICE OR REGULAR SODA OR ONE TUBE OF GLUCOSE GEL    STEP 2: RECHECK blood sugar in 15 MINUTES STEP 3: If your blood sugar is still low at the 15 minute recheck --> then, go back to STEP 1 and treat AGAIN with another 15 grams of carbohydrates.

## 2021-12-04 ENCOUNTER — Ambulatory Visit: Payer: Self-pay

## 2021-12-04 NOTE — Patient Instructions (Signed)
Visit Information  Thank you for taking time to visit with me today. Please don't hesitate to contact me if I can be of assistance to you.   Following are the goals we discussed today:   Goals Addressed             This Visit's Progress    RNCM: Effective management of DM       Care Coordination Interventions:  Lab Results  Component Value Date   HGBA1C 5.4 11/10/2021    Provided education to patient about basic DM disease process. Patient has seen endocrinologist and is following recommendations.  Reviewed medications with patient and discussed importance of medication adherence. Review of medications and needs related to medications Counseled on importance of regular laboratory monitoring as prescribed Discussed plans with patient for ongoing care management follow up and provided patient with direct contact information for care management team Provided patient with written educational materials related to hypo and hyperglycemia and importance of correct treatment. The patient is still having up and down readings. The patient states that she is checking her blood sugars frequently with her dexcom and she has snacks with her close by at all times. Review of eating frequent small meals and monitoring for episodes of hypoglycemia.  Reviewed scheduled/upcoming provider appointments including: Review of calling pcp for changes and keeping appointments Advised patient, providing education and rationale, to check cbg has dexcom 7 and having different readings due to weight loss and record, saw endocrinologist recently who advised the patient not to increase her ss insulin due to chances of hypoglycemia Review of patient status, including review of consultants reports, relevant laboratory and other test results, and medications completed Screening for signs and symptoms of depression related to chronic disease state. Patient is still dealing with issues from getting her divorce. The patient goes  to court on 11-3 and 11-6. The patient states that she will hopefully have that behind her soon but they keep dragging it out. She feels safe and she is having a good day today.  Assessed social determinant of health barriers The patient has changed her pcp to [facility]. Ask for the record to be updated. Patient no longer a patient at [facility] Review of changing of RNCM. Education and support given.            Our next appointment is by telephone on 01-15-2022 at 1130 am  Please call the care guide team at [phone] if you need to cancel or reschedule your appointment.   If you are experiencing a Mental Health or [place] or need someone to talk to, please call the Suicide and Crisis Lifeline: 988 call the [place] National Suicide Prevention Lifeline: 248 775 9008 or TTY: 807-541-8862 TTY (402) 495-0151) to talk to a trained counselor call 1-800-273-TALK (toll free, 24 hour hotline)  Patient verbalizes understanding of instructions and care plan provided today and agrees to view in [place]. Active MyChart status and patient understanding of how to access instructions and care plan via MyChart confirmed with patient.     Telephone follow up appointment with care management team member scheduled for: 01-15-2022 at 67 am  [place], MSN, [place]  Mobile: [phone]

## 2021-12-04 NOTE — Patient Outreach (Signed)
  Care Coordination   Follow Up Visit Note   12/04/2021 Name: Alejandra Frederick MRN: [id number] DOB: March 27, 1970  Alejandra Frederick is a 51 y.o. year old female who sees Tracie Harrier, MD for primary care. I spoke with  Alejandra Frederick by phone today.  What matters to the patients health and wellness today?  DM stabilizing out and balance with anxiety    Goals Addressed             This Visit's Progress    RNCM: Effective management of DM       Care Coordination Interventions:  Lab Results  Component Value Date   HGBA1C 5.4 11/10/2021    Provided education to patient about basic DM disease process. Patient has seen endocrinologist and is following recommendations.  Reviewed medications with patient and discussed importance of medication adherence. Review of medications and needs related to medications Counseled on importance of regular laboratory monitoring as prescribed Discussed plans with patient for ongoing care management follow up and provided patient with direct contact information for care management team Provided patient with written educational materials related to hypo and hyperglycemia and importance of correct treatment. The patient is still having up and down readings. The patient states that she is checking her blood sugars frequently with her dexcom and she has snacks with her close by at all times. Review of eating frequent small meals and monitoring for episodes of hypoglycemia.  Reviewed scheduled/upcoming provider appointments including: Review of calling pcp for changes and keeping appointments Advised patient, providing education and rationale, to check cbg has dexcom 7 and having different readings due to weight loss and record, saw endocrinologist recently who advised the patient not to increase her ss insulin due to chances of hypoglycemia Review of patient status, including review of consultants reports, relevant laboratory and other test results, and medications  completed Screening for signs and symptoms of depression related to chronic disease state. Patient is still dealing with issues from getting her divorce. The patient goes to court on 11-3 and 11-6. The patient states that she will hopefully have that behind her soon but they keep dragging it out. She feels safe and she is having a good day today.  Assessed social determinant of health barriers The patient has changed her pcp to [facility]. Ask for the record to be updated. Patient no longer a patient at [facility] Review of changing of RNCM. Education and support given.            SDOH assessments and interventions completed:  Yes  SDOH Interventions Today    Flowsheet Row Most Recent Value  SDOH Interventions   Utilities Interventions Intervention Not Indicated        Care Coordination Interventions Activated:  Yes  Care Coordination Interventions:  Yes, provided   Follow up plan: Follow up call scheduled for 01-15-2022    Encounter Outcome:  Pt. Visit Completed

## 2022-01-06 ENCOUNTER — Ambulatory Visit: Payer: Medicare Other | Admitting: Internal Medicine

## 2022-01-13 ENCOUNTER — Ambulatory Visit: Payer: Self-pay | Admitting: *Deleted

## 2022-01-13 NOTE — Patient Outreach (Signed)
  Care Coordination   Follow Up Visit Note   01/13/2022 Name: Hava Massingale MRN: [id number] DOB: [birth year]  Debbra Digiulio is a 51 y.o. year old female who sees Tracie Harrier, MD for primary care. I spoke with  [name] by phone today.  What matters to the patients health and wellness today?  Has frequent alerts from [facility] for critical lows.  Always have to make sure she has something near to eat.     Goals Addressed             This Visit's Progress    RNCM: Effective management of DM   On track    Care Coordination Interventions:  Lab Results  Component Value Date   HGBA1C 5.4 11/10/2021    Provided education to patient about basic DM disease process. Patient has seen endocrinologist, last visit in October, next visit in April 2024 and is following recommendations.  Reviewed medications with patient and discussed importance of medication adherence. Review of medications and needs related to medications Counseled on importance of regular laboratory monitoring as prescribed Discussed plans with patient for ongoing care management follow up and provided patient with direct contact information for care management team Provided patient with written educational materials related to hypo and hyperglycemia and importance of correct treatment. The patient is still having critical low readings, had one today during outreach. The patient states that she is checking her blood sugars frequently with her dexcom and she has snacks with her close by at all times. Review of eating frequent small meals and monitoring for episodes of hypoglycemia. State she is eating something at least every hour to prevent hypoglycemia. Reviewed scheduled/upcoming provider appointments including: Review of calling pcp for changes and keeping appointments.  PCP scheduled for 1/3 Advised patient, providing education and rationale, to check cbg has dexcom 7 and having different readings due to weight loss and  record, saw endocrinologist recently who advised the patient not to increase her ss insulin due to chances of hypoglycemia Review of patient status, including review of consultants reports, relevant laboratory and other test results, and medications completed Screening for signs and symptoms of depression related to chronic disease state. Patient is still dealing with issues from getting her divorce but state things are better.  She does report having trouble with getting treatment for psych conditions, stating appointment can't be secured until late next year.  Will collaborate with CSW to schedule counseling.            SDOH assessments and interventions completed:  No     Care Coordination Interventions:  Yes, provided   Follow up plan: Follow up call scheduled for 1/4    Encounter Outcome:  Pt. Visit Completed   Valente David, RN, MSN, [place] Management Care Management Coordinator [phone]

## 2022-01-13 NOTE — Patient Instructions (Signed)
Visit Information  Thank you for taking time to visit with me today. Please don't hesitate to contact me if I can be of assistance to you before our next scheduled telephone appointment.  Following are the goals we discussed today:  Listen for call from [place] for counseling.  Continue with frequent snacks, if continue to have critical low alerts, notify endocrinology.  Our next appointment is by telephone on 1/4  Please call the care guide team at [phone] if you need to cancel or reschedule your appointment.   Please call the Suicide and Crisis Lifeline: 988 call the [place] National Suicide Prevention Lifeline: 470-570-6456 or TTY: 810 706 2361 TTY 802-098-1592) to talk to a trained counselor call 1-800-273-TALK (toll free, 24 hour hotline) call 911 if you are experiencing a Mental Health or [place] or need someone to talk to.  Patient verbalizes understanding of instructions and care plan provided today and agrees to view in [place]. Active MyChart status and patient understanding of how to access instructions and care plan via MyChart confirmed with patient.     The patient has been provided with contact information for the care management team and has been advised to call with any health related questions or concerns.   Valente David, RN, MSN, [place] Care Management Care Management Coordinator [phone]

## 2022-01-15 ENCOUNTER — Telehealth: Payer: Self-pay | Admitting: *Deleted

## 2022-01-15 ENCOUNTER — Encounter: Payer: Medicare Other | Admitting: *Deleted

## 2022-01-15 NOTE — Patient Outreach (Signed)
  Care Coordination   01/15/2022 Name: Alejandra Frederick MRN: [id number] DOB: 07-Mar-1970   Care Coordination Outreach Attempts:  An unsuccessful telephone outreach was attempted today to offer the patient information about available care coordination services as a benefit of their health plan.   Follow Up Plan:  Additional outreach attempts will be made to offer the patient care coordination information and services.   Encounter Outcome:  No Answer   Care Coordination Interventions:  No, not indicated    Kellsey Sansone, [place] Worker  [facility] Care Management [phone]
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
## 2022-01-26 ENCOUNTER — Telehealth: Payer: Self-pay | Admitting: Obstetrics and Gynecology

## 2022-01-26 NOTE — Telephone Encounter (Signed)
Incoming call via answering service regarding bladder pain and difficulty urinating.  Pt confirmed name and DOB, and reports "recent bladder surgery" where the Doctor "sewed up her bladder". On record review, her surgery was in 2020 by Dr Larey Days. Pt also reports that she has a scheduled appt in the office at the end of January 2024 for her routine physical with Dr Leafy Ro.    Pt reports that she feels like broken glass when she urinates and has to "push above her vagina" to be able to urinate. Denies back pain, or fever. Reports these symptoms have been present for about 1 week.  Encouraged the pt to contact the office in the morning to be seen for possible UTI or if her pain became severe she may want to consider being seen in urgent care or ER.  Pt then reports that she is currently living about 2 hours away and is unable to come to the office. Encouraged pt to be seen locally for mgmt of likely urinary tract infection.   Francetta Found, CNM 01/26/2022 10:52 PM

## 2022-01-28 ENCOUNTER — Encounter: Payer: Self-pay | Admitting: Internal Medicine

## 2022-01-28 DIAGNOSIS — E1140 Type 2 diabetes mellitus with diabetic neuropathy, unspecified: Secondary | ICD-10-CM

## 2022-01-29 MED ORDER — INSULIN ASPART 100 UNIT/ML CARTRIDGE (PENFILL): SUBCUTANEOUS | 11 refills | Status: DC

## 2022-02-05 ENCOUNTER — Ambulatory Visit: Payer: Self-pay | Admitting: *Deleted

## 2022-02-05 NOTE — Patient Instructions (Signed)
Visit Information  Thank you for taking time to visit with me today. Please don't hesitate to contact me if I can be of assistance to you.   Following are the goals we discussed today:   Goals Addressed             This Visit's Progress    Mental Health Resources       Care Coordination Interventions:  Patient confirms that her medications continue to be managed by her primary care doctor, medications previously managed by [facility], however no longer an option Per patient, she would like the PCP to continue to manage her medications but is now also open to being referred for trauma therapy Patient continues to confirm having a positive network of support from her church family, chosen family and friends-was able to verbalize positive self care strategies to manage emotions during the holiday season Confirms mental health follow up through [facility], individual and group treatment-will start back up in 2024 Current treatment positively reinforced, emotion support  CSW to provide patient with resources for trauma therapy          Our next appointment is by telephone on 02/23/22 at 2pm  Please call the care guide team at [phone] if you need to cancel or reschedule your appointment.   If you are experiencing a Mental Health or [place] or need someone to talk to, please call the Suicide and Crisis Lifeline: 988   Patient verbalizes understanding of instructions and care plan provided today and agrees to view in [place]. Active MyChart status and patient understanding of how to access instructions and care plan via MyChart confirmed with patient.     Telephone follow up appointment with care management team member scheduled for: 02/23/22  Elliot Gurney, [place] Worker  [facility] Care Management [phone]

## 2022-02-05 NOTE — Patient Outreach (Signed)
  Care Coordination   Follow Up Visit Note   02/05/2022 Name: Alejandra Frederick MRN: [id number] DOB: 01/18/1971  Alejandra Frederick is a 51 y.o. year old female who sees Tracie Harrier, MD for primary care. I spoke with  [name] by phone today.  What matters to the patients health and wellness today?  Mental health resources    Goals Addressed             This Visit's Progress    Mental Health Resources       Care Coordination Interventions:  Patient confirms that her medications continue to be managed by her primary care doctor, medications previously managed by [facility], however no longer an option Per patient, she would like the PCP to continue to manage her medications but is now also open to being referred for trauma therapy Patient continues to confirm having a positive network of support from her church family, chosen family and friends-was able to verbalize positive self care strategies to manage emotions during the holiday season Confirms mental health follow up through [facility], individual and group treatment-will start back up in 2024 Current treatment positively reinforced, emotion support  CSW to provide patient with resources for trauma therapy          SDOH assessments and interventions completed:  No     Care Coordination Interventions:  Yes, provided   Follow up plan: Follow up call scheduled for 02/23/22    Encounter Outcome:  Pt. Visit Completed

## 2022-02-12 ENCOUNTER — Ambulatory Visit: Payer: Self-pay | Admitting: *Deleted

## 2022-02-12 NOTE — Patient Outreach (Signed)
  Care Coordination   02/12/2022 Name: Solomia Harrell MRN: [id number] DOB: Dec 08, 1970   Care Coordination Outreach Attempts:  An unsuccessful telephone outreach was attempted for a scheduled appointment today.  Follow Up Plan:  Additional outreach attempts will be made to offer the patient care coordination information and services.   Encounter Outcome:  No Answer   Care Coordination Interventions:  No, not indicated    Valente David, RN, MSN, [facility] Care Management Care Management Coordinator [phone]

## 2022-02-12 NOTE — Patient Outreach (Signed)
  Care Coordination   Follow Up Visit Note   02/12/2022 Name: Alejandra Frederick MRN: [id number] DOB: May 15, 1970  Alejandra Frederick is a 52 y.o. year old female who sees Tracie Harrier, MD for primary care. I spoke with  [name] by phone today.  What matters to the patients health and wellness today?  Had Covid over the holidays, state she is now feeling better.  Continue to have labile blood sugars.     Goals Addressed             This Visit's Progress    RNCM: Effective management of DM   On track    Care Coordination Interventions:  Lab Results  Component Value Date   HGBA1C 5.4 11/10/2021    Provided education to patient about basic DM disease process. Patient has seen endocrinologist, last visit in October, next visit in April 2024 and is following recommendations.  Reviewed medications with patient and discussed importance of medication adherence. Review of medications and needs related to medications.  State she is trying to decrease use of insulin as she is now experiencing more lows.  She will discuss with provider.  Counseled on importance of regular laboratory monitoring as prescribed Discussed plans with patient for ongoing care management follow up and provided patient with direct contact information for care management team Provided patient with written educational materials related to hypo and hyperglycemia and importance of correct treatment. The patient is still having critical low readings, had one today during outreach. The patient states that she is checking her blood sugars frequently with her dexcom and she has snacks with her close by at all times. Review of eating frequent small meals and monitoring for episodes of hypoglycemia. State she is eating something at least every hour to prevent hypoglycemia. Reviewed scheduled/upcoming provider appointments including: Review of calling pcp for changes and keeping appointments.  CSW appointment on 1/15 Advised patient,  providing education and rationale, to check cbg has dexcom 7 and having different readings due to weight loss and record, saw endocrinologist recently who advised the patient not to increase her ss insulin due to chances of hypoglycemia Review of patient status, including review of consultants reports, relevant laboratory and other test results, and medications completed Encouraged to continue to infection prevention to avoid further covid/flu/RSV           SDOH assessments and interventions completed:  No     Care Coordination Interventions:  Yes, provided   Follow up plan: Follow up call scheduled for 2/1    Encounter Outcome:  Pt. Visit Completed   Valente David, RN, MSN, [place] [facility] Care Management Coordinator [phone]

## 2022-02-23 ENCOUNTER — Ambulatory Visit: Payer: Self-pay | Admitting: *Deleted

## 2022-02-23 NOTE — Patient Instructions (Signed)
Visit Information  Thank you for taking time to visit with me today. Please don't hesitate to contact me if I can be of assistance to you.   Following are the goals we discussed today:   Goals Addressed             This Visit's Progress    Mental Health Resources       Care Coordination Interventions:  Patient confirms that her medications continue to be managed by her primary care doctor Patient acknowledged symptoms of depression over the holidays, now making more of an effort to be pro-active ie improving eating habits, hygiene practices, and being consistent with her medications Patient open to being referred for trauma therapy-provider not identified to date Patient continues to confirm having a positive network of support from her church family, chosen family and friends-continues to verbalize positive self care strategies to manage emotions during the holiday season Confirms mental health follow up through [facility], individual and group treatment-will start back up in 2024-they also support her desire begin trauma therapy Current treatment positively reinforced, emotional support continues to be provided CSW to provide patient with resources for trauma therapy          Our next appointment is by telephone on 03/05/22 at 2pm  Please call the care guide team at [phone] if you need to cancel or reschedule your appointment.   If you are experiencing a Mental Health or [place] or need someone to talk to, please call the Suicide and Crisis Lifeline: 988   Patient verbalizes understanding of instructions and care plan provided today and agrees to view in [place]. Active MyChart status and patient understanding of how to access instructions and care plan via MyChart confirmed with patient.     Telephone follow up appointment with care management team member scheduled for: 03/05/22  Elliot Gurney, [place] Worker  [facility] Care  Management [phone]

## 2022-02-23 NOTE — Patient Outreach (Signed)
  Care Coordination   Follow Up Visit Note   02/23/2022 Name: Mirella Gueye MRN: [id number] DOB: [birth year]  Rebekka Lobello is a 52 y.o. year old female who sees Tracie Harrier, MD for primary care. I spoke with  [name] by phone today.  What matters to the patients health and wellness today?  Trauma therapy    Goals Addressed             This Visit's Progress    Mental Health Resources       Care Coordination Interventions:  Patient confirms that her medications continue to be managed by her primary care doctor Patient acknowledged symptoms of depression over the holidays, now making more of an effort to be pro-active ie improving eating habits, hygiene practices, and being consistent with her medications Patient open to being referred for trauma therapy-provider not identified to date Patient continues to confirm having a positive network of support from her church family, chosen family and friends-continues to verbalize positive self care strategies to manage emotions during the holiday season Confirms mental health follow up through [facility], individual and group treatment-will start back up in 2024-they also support her desire begin trauma therapy Current treatment positively reinforced, emotional support continues to be provided CSW to provide patient with resources for trauma therapy          SDOH assessments and interventions completed:  No     Care Coordination Interventions:  Yes, provided   Follow up plan: Follow up call scheduled for 03/05/22    Encounter Outcome:  Pt. Visit Completed

## 2022-03-05 ENCOUNTER — Ambulatory Visit: Payer: Self-pay | Admitting: *Deleted
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
## 2022-03-12 ENCOUNTER — Ambulatory Visit: Payer: Self-pay | Admitting: *Deleted

## 2022-03-12 NOTE — Patient Outreach (Signed)
  Care Coordination   03/12/2022 Name: Alejandra Frederick MRN: [id number] DOB: [birth year]   Care Coordination Outreach Attempts:  An unsuccessful telephone outreach was attempted for a scheduled appointment today.  Follow Up Plan:  Additional outreach attempts will be made to offer the patient care coordination information and services.   Encounter Outcome:  No Answer   Care Coordination Interventions:  No, not indicated    Valente David, RN, MSN, [facility] Care Management Care Management Coordinator [phone]

## 2022-03-12 NOTE — Patient Instructions (Signed)
Visit Information  Thank you for taking time to visit with me today. Please don't hesitate to contact me if I can be of assistance to you before our next scheduled telephone appointment.  Following are the goals we discussed today:  Keep eating small snacks to avoid hypoglycemia.  Our next appointment is by telephone on 4/5  Please call the care guide team at [phone] if you need to cancel or reschedule your appointment.   Please call the Suicide and Crisis Lifeline: 988 call the [place] National Suicide Prevention Lifeline: (530) 384-1862 or TTY: 252-074-0957 TTY 979 574 0517) to talk to a trained counselor call 1-800-273-TALK (toll free, 24 hour hotline) call 911 if you are experiencing a Mental Health or [place] or need someone to talk to.  Patient verbalizes understanding of instructions and care plan provided today and agrees to view in [place]. Active MyChart status and patient understanding of how to access instructions and care plan via MyChart confirmed with patient.     The patient has been provided with contact information for the care management team and has been advised to call with any health related questions or concerns.   Valente David, RN, MSN, [place] Care Management Care Management Coordinator [phone]

## 2022-03-12 NOTE — Patient Outreach (Signed)
  Care Coordination   Follow Up Visit Note   03/12/2022 Name: Alejandra Frederick MRN: [id number] DOB: [birth year]  Alejandra Frederick is a 52 y.o. year old female who sees Tracie Harrier, MD for primary care. I spoke with  [name] by phone today.  What matters to the patients health and wellness today?  Continues to have labile blood sugars, as low as 40s.  Eating small snacks every hour to decrease risk of hypoglycemic episodes.      Goals Addressed             This Visit's Progress    RNCM: Effective management of DM   On track    Care Coordination Interventions:  Lab Results  Component Value Date   HGBA1C 5.4 11/10/2021    Provided education to patient about basic DM disease process. Patient has seen endocrinologist, last visit in October, next visit in April 2024 and is following recommendations.  Reviewed medications with patient and discussed importance of medication adherence. Review of medications and needs related to medications.  State she is trying to decrease use of insulin as she is now experiencing more lows.  She will discuss with provider.  Counseled on importance of regular laboratory monitoring as prescribed Discussed plans with patient for ongoing care management follow up and provided patient with direct contact information for care management team Provided patient with written educational materials related to hypo and hyperglycemia and importance of correct treatment. The patient is still having critical low readings, had one today during outreach. The patient states that she is checking her blood sugars frequently with her dexcom and she has snacks with her close by at all times. Review of eating frequent small meals and monitoring for episodes of hypoglycemia. State she is eating something at least every hour to prevent hypoglycemia. Reviewed scheduled/upcoming provider appointments including: Review of calling pcp for changes and keeping appointments.  CSW  appointment on 2/8 Advised patient, providing education and rationale, to check cbg has dexcom 7 and having different readings due to weight loss and record, saw endocrinologist recently who advised the patient not to increase her ss insulin due to chances of hypoglycemia Review of patient status, including review of consultants reports, relevant laboratory and other test results, and medications completed            SDOH assessments and interventions completed:  No     Care Coordination Interventions:  Yes, provided   Follow up plan: Follow up call scheduled for 4/5    Encounter Outcome:  Pt. Visit Completed   Valente David, RN, MSN, [place] Care Management Care Management Coordinator [phone]

## 2022-03-13 ENCOUNTER — Telehealth: Payer: Self-pay | Admitting: *Deleted

## 2022-03-13 NOTE — Progress Notes (Unsigned)
  Care Coordination Note  03/13/2022 Name: Tyyonna Soucy MRN: [id number] DOB: 02/19/1970  Justyce Yeater is a 52 y.o. year old female who is a primary care patient of Tracie Harrier, MD and is actively engaged with the care management team. I reached out to [name] by phone today to assist with re-scheduling a follow up visit with the Licensed Clinical Social Worker  Follow up plan: Unsuccessful telephone outreach attempt made. A HIPAA compliant phone message was left for the patient providing contact information and requesting a return call.   [place]  Direct Dial: [phone]

## 2022-03-17 NOTE — Progress Notes (Signed)
  Care Coordination Note  03/17/2022 Name: [name] MRN: [id number] DOB: September 19, 1970  Alejandra Frederick is a 52 y.o. year old female who is a primary care patient of Tracie Harrier, MD and is actively engaged with the care management team. I reached out to [name] by phone today to assist with re-scheduling a follow up visit with the Licensed Clinical Social Worker  Follow up plan: Telephone appointment with care management team member scheduled for:03/23/22  [place]: [phone]

## 2022-03-19 ENCOUNTER — Encounter: Payer: Medicare Other | Admitting: *Deleted

## 2022-03-23 ENCOUNTER — Ambulatory Visit: Payer: Self-pay | Admitting: *Deleted
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
## 2022-04-06 ENCOUNTER — Ambulatory Visit: Payer: Self-pay | Admitting: *Deleted
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
## 2022-04-08 ENCOUNTER — Telehealth: Payer: Self-pay | Admitting: *Deleted

## 2022-04-08 NOTE — Patient Instructions (Signed)
Visit Information  Thank you for taking time to visit with me today. Please don't hesitate to contact me if I can be of assistance to you.   Following are the goals we discussed today:   Goals Addressed             This Visit's Progress    Mental Health Resources       Care Coordination Interventions: Edwina Barth Patient confirms that her medications continue to be managed by her primary care doctor in [facility], however states that she is now living between [place] and [place]-she plans to possibly move there permanently post her divorce from spouse and getting "everything transferred over" Patient states that she saw a medical provider in [facility] and is requesting a letter to be written to the MD in [facility] stating that they would be allowed to prescribe medications. This Education officer, museum confirmed the inability to provide said  letter she is requesting and encouraged her to discuss her medication concerns with her doctor in [place]. Discussed need to establish herself with a mental health provider once she has made the permanent move to [facility] for ongoing follow up Patient  referred to [facility] and continues with plan to complete intake paperwork received, once completed and returned initial appointment will be scheduled Confirms continued plan to work on her divorce with the help of the [place] next appointment is by telephone on 04/20/22 at 1pm  Please call the care guide team at [phone] if you need to cancel or reschedule your appointment.   If you are experiencing a Mental Health or [place] or need someone to talk to, please call the Suicide and Crisis Lifeline: 988   Patient verbalizes understanding of instructions and care plan provided today and agrees to view in [place]. Active MyChart status and patient understanding of how to access instructions and care plan via  MyChart confirmed with patient.     Telephone follow up appointment with care management team member scheduled for: 04/20/22  Elliot Gurney, [place] Worker  [facility] Care Management [phone]

## 2022-04-08 NOTE — Patient Outreach (Signed)
  Care Coordination   Follow Up Visit Note   04/08/2022 Name: Alejandra Frederick MRN: [id number] DOB: January 30, 1971  Alejandra Frederick is a 52 y.o. year old female who sees Alejandra Harrier, MD for primary care. I spoke with  Alejandra Frederick by phone today.  What matters to the patients health and wellness today?  Mental health follow up    Goals Addressed             This [place] Coordination Interventions: Alejandra Frederick Patient confirms that her medications continue to be managed by her primary care doctor in [facility], however states that she is now living between [place] and [place]-she plans to possibly move there permanently post her divorce from spouse and getting "everything transferred over" Patient states that she saw a medical provider in [facility] and is requesting a letter to be written to the MD in [facility] stating that they would be allowed to prescribe medications. This Education officer, museum confirmed the inability to provide said  letter she is requesting and encouraged her to discuss her medication concerns with her doctor in [place]. Discussed need to establish herself with a mental health provider once she has made the permanent move to [facility] for ongoing follow up Patient  referred to [facility] and continues with plan to complete intake paperwork received, once completed and returned initial appointment will be scheduled Confirms continued plan to work on her divorce with the help of the [facility]         SDOH assessments and interventions completed:  No     Care Coordination Interventions:  Yes, provided  Interventions Today    Flowsheet Row Most Recent Value  Chronic Disease   Chronic disease during today's visit Diabetes, Hypertension (HTN), Other  [Bipolar disorder]  General Interventions   General Interventions Discussed/Reviewed General Interventions  Reviewed, Community Resources  Mental Health Interventions   Mental Health Discussed/Reviewed Mental Health Reviewed       Follow up plan: Follow up call scheduled for 04/20/22    Encounter Outcome:  Pt. Visit Completed

## 2022-04-10 ENCOUNTER — Telehealth: Payer: Self-pay | Admitting: *Deleted

## 2022-04-10 NOTE — Patient Outreach (Signed)
  Care Coordination   Follow Up Visit Note   04/10/2022 Name: Alejandra Frederick MRN: [id number] DOB: 08-09-1970  Alejandra Frederick is a 52 y.o. year old female who sees Tracie Harrier, MD for primary care. I spoke with  Alejandra Frederick by phone today.  What matters to the patients health and wellness today?  Mental Health follow up    Goals Addressed             This Visit's Progress    Mental Health Resources       Care Coordination Interventions:  Patient  states that the link to create a patient portal with  [facility] has expired-phone call provided to [facility] to request a new link Patient denies thoughts of self self harm/harm to others Patient given the 988 crisis line in the event of a mental health crisis Patient agrees to have a friend of hers visit this weekend for additional support         SDOH assessments and interventions completed:  No     Care Coordination Interventions:  Yes, provided  Interventions Today    Flowsheet Row Most Recent Value  Chronic Disease   Chronic disease during today's visit Diabetes, Hypertension (HTN)  General Interventions   General Interventions Discussed/Reviewed General Interventions Reviewed, [place] Discussed/Reviewed Mental Health Reviewed, Coping Strategies, Crisis, Depression  [patient dealng with the reality of divorce, currently working with advocates from the [facility], follow up wit [facility] encouraged]  Safety Interventions   Safety Discussed/Reviewed Safety Discussed  [phone] crisis line provided in the event of a mental health emergency]       Follow up plan: Follow up call scheduled for 04/20/22    Encounter Outcome:  Pt. Visit Completed

## 2022-04-10 NOTE — Patient Instructions (Addendum)
Visit Information  Thank you for taking time to visit with me today. Please don't hesitate to contact me if I can be of assistance to you.   Following are the goals we discussed today:   Goals Addressed             This Visit's Progress    Mental Health Resources       Care Coordination Interventions:  Patient  states that the link to create a patient portal with  [facility] has expired-phone call provided to [facility] to request a new link Patient denies thoughts of self self harm/harm to others Patient given the 988 crisis line in the event of a mental health crisis Patient agrees to have a friend of hers visit this weekend for additional support         Our next appointment is by telephone on 04/20/22 at 1pm  Please call the care guide team at [phone] if you need to cancel or reschedule your appointment.   If you are experiencing a Mental Health or [place] or need someone to talk to, please call the Suicide and Crisis Lifeline: 988   Patient verbalizes understanding of instructions and care plan provided today and agrees to view in [place]. Active MyChart status and patient understanding of how to access instructions and care plan via MyChart confirmed with patient.     Telephone follow up appointment with care management team member scheduled for: 04/20/22  Elliot Gurney, [place] Worker  [facility] Care Management [phone]

## 2022-04-13 ENCOUNTER — Other Ambulatory Visit (HOSPITAL_COMMUNITY): Payer: Self-pay

## 2022-04-13 ENCOUNTER — Telehealth: Payer: Self-pay

## 2022-04-13 NOTE — Telephone Encounter (Signed)
PA has been APPROVED from 04/13/2022 until further notice under [facility] Medicare.

## 2022-04-13 NOTE — Telephone Encounter (Signed)
Temporary supply notice received via fax from [facility] for [place] 100UNIT/ML cartridges; letter has been attached in patients documents.   PA submitted via CMM for NovoLOG PenFill 100UNIT/ML cartridges to [facility] and is pending determination.   Key: [id number]

## 2022-04-20 ENCOUNTER — Encounter: Payer: Self-pay | Admitting: *Deleted

## 2022-04-20 ENCOUNTER — Telehealth: Payer: Self-pay | Admitting: *Deleted

## 2022-04-20 NOTE — Patient Outreach (Signed)
  Care Coordination   04/20/2022 Name: Shatana Saxton MRN: [id number] DOB: 03-27-1970   Care Coordination Outreach Attempts:  An unsuccessful telephone outreach was attempted for a scheduled appointment today.  Follow Up Plan:  Additional outreach attempts will be made to offer the patient care coordination information and services.   Encounter Outcome:  No Answer   Care Coordination Interventions:  No, not indicated    Danyla Wattley, [place] Worker  [facility] Care Management [phone]

## 2022-04-24 ENCOUNTER — Ambulatory Visit: Payer: Self-pay | Admitting: *Deleted

## 2022-04-24 NOTE — Patient Outreach (Addendum)
  Care Coordination   Follow Up Visit Note   04/24/2022 Name: Katrielle Monterosa MRN: [id number] DOB: 05-07-1970  Zer Gabor is a 52 y.o. year old female who sees Tracie Harrier, MD for primary care. I spoke with  [name] by phone today.  What matters to the patients health and wellness today?  Mental Health follow up. Patient discussed working towards acceptance of her divorce-divorce to be finalized on 05/08/22-per patient , " I will be relieved when it is finally over". Patient able to verbalized positive coping strategies to manage feelings of loss.   Goals Addressed             This Visit's Progress    Mental Health Resources       Activities and task to complete in order to accomplish goals.   EMOTIONAL / [place] Call [facility] to request new link to complete intake paperwork and  to schedule your counseling appointment.           SDOH assessments and interventions completed:  No     Care Coordination Interventions:  Yes, provided  Interventions Today    Flowsheet Row Most Recent Value  Chronic Disease   Chronic disease during today's visit Diabetes, Hypertension (HTN)  General Interventions   General Interventions Discussed/Reviewed General Interventions Reviewed  Mental Health Interventions   Mental Health Discussed/Reviewed Mental Health Reviewed, Coping Strategies, Depression  [Patient plans on completing intake paperwork to begin individual counseling with [facility]-continues to work on processing pending divorce]       Follow up plan: Follow up call scheduled for 05/11/22    Encounter Outcome:  Pt. Visit Completed

## 2022-04-24 NOTE — Patient Instructions (Signed)
Visit Information  Thank you for taking time to visit with me today. Please don't hesitate to contact me if I can be of assistance to you.   Following are the goals we discussed today:   Goals Addressed             This Visit's Progress    Mental Health Resources       Activities and task to complete in order to accomplish goals.   EMOTIONAL / [place] Call [facility] to request new link to complete intake paperwork and  to schedule your counseling appointment.           Our next appointment is by telephone on 05/11/22 at 1pm  Please call the care guide team at [phone] if you need to cancel or reschedule your appointment.   If you are experiencing a Mental Health or [place] or need someone to talk to, please call the Suicide and Crisis Lifeline: 988   Patient verbalizes understanding of instructions and care plan provided today and agrees to view in [place]. Active MyChart status and patient understanding of how to access instructions and care plan via MyChart confirmed with patient.     Telephone follow up appointment with care management team member scheduled for: 05/11/22 1pm   Choua Chalker, [place] Worker  [facility] Care Management [phone]

## 2022-05-11 ENCOUNTER — Ambulatory Visit: Payer: Self-pay | Admitting: *Deleted

## 2022-05-12 ENCOUNTER — Telehealth (INDEPENDENT_AMBULATORY_CARE_PROVIDER_SITE_OTHER): Payer: Medicare Other | Admitting: Internal Medicine

## 2022-05-12 ENCOUNTER — Encounter: Payer: Self-pay | Admitting: Internal Medicine

## 2022-05-12 ENCOUNTER — Telehealth: Payer: Self-pay | Admitting: Internal Medicine

## 2022-05-12 DIAGNOSIS — E1140 Type 2 diabetes mellitus with diabetic neuropathy, unspecified: Secondary | ICD-10-CM

## 2022-05-12 MED ORDER — TRESIBA FLEXTOUCH 100 UNIT/ML ~~LOC~~ SOPN: 10.0000 [IU] | PEN_INJECTOR | Freq: Every day | SUBCUTANEOUS | 3 refills | Status: DC

## 2022-05-12 NOTE — Telephone Encounter (Signed)
Can you please fax her office note from today 05/12/2022 to [place]?  Thanks

## 2022-05-12 NOTE — Progress Notes (Signed)
Virtual Visit via Video Note  I connected with Alejandra Frederick on 05/12/22 at 1:20 pm  by a video enabled telemedicine application and verified that I am speaking with the correct person using two identifiers.   I discussed the limitations of evaluation and management by telemedicine and the availability of in person appointments. The patient expressed understanding and agreed to proceed.   -Location of the patient :home  -Location of the provider : Office -The names of all persons participating in the telemedicine service : Pt and myself       Name: Alejandra Frederick  Age/ Sex: 52 y.o., female   MRN/ DOB: [id number], 11-05-1970     PCP: Tracie Harrier, MD   Reason for Endocrinology Evaluation: Type 2 Diabetes Mellitus  Initial Endocrine Consultative Visit:  04/04/2020    PATIENT IDENTIFIER: Alejandra Frederick is a 52 y.o. female with a past medical history of T2DM, Bipolar disorder, HTN , and Dyslipidemia.. The patient has followed with Endocrinology clinic since 04/04/2020 for consultative assistance with management of her diabetes.  DIABETIC HISTORY:  Alejandra Frederick was diagnosed with DM at age 49, this started as gestational diabetes.  She is intolerant to metformin due to GI side effects, she has also been tried on Ozempic/Trulicity as well as Jardiance. Her hemoglobin A1c has ranged from 7.5% in 2018, peaking at 7.6% in 2020.  On her initial visit to our clinic she had an A1c of 8.5%, she was on basal insulin as well as using NovoLog per sliding scale, we provided her with a correction scale, continue [place] and started her on Farxiga. SUBJECTIVE:   During the last visit (11/22/2022): A1c 5.4 %    Today (05/12/2022): Alejandra Frederick is here for a follow up on diabetes management.  She checks her blood sugars multiple  times through CGM. The patient has had hypoglycemic episodes since the last clinic visit.  The patient is symptomatic with these episodes  She was diagnosed with COVID  04/12/2022 as well as seizures, she was treated with multiple courses of antibiotics as well as starting antiseizure medication.  She continues to feel weak with cough and congestion, symptoms are improving over the past month.  She did have to increase her [place] during acute COVID but had reduced her Tyler Aas down to 12 units  She continues to use NovoLog as needed with each meal and per correction scale      HOME DIABETES REGIMEN:  Tresiba 12  units daily Novolog 1 unit TIAQAC CF : NovoLog ( Bg-130/40)       Statin: yes ACE-I/ARB: yes   CONTINUOUS GLUCOSE MONITORING RECORD INTERPRETATION : Unable to download    In Pen report 3/19 - 05/11/2022 Average 121 mg/DL Missed doses 13 SD thank [id number]     DIABETIC COMPLICATIONS: Microvascular complications:  CKD III Denies: retinopathy, neuropathy Last Eye Exam: Completed 2021  Macrovascular complications:   Denies: CAD, CVA, PVD   HISTORY:  Past Medical History:  Past Medical History:  Diagnosis Date   Anxiety    Asthma    Cervical cancer ([place]) 2007   surgical resection to freeze cells.    Complication of anesthesia    hard to wake up    Depression    Gastroparesis    Headache    High cholesterol    Hypertension    Myocardial infarction [facility]) 2016   Pulmonary embolus ([place])    Stroke [facility]) 2013   Past Surgical History:  Past Surgical History:  Procedure Laterality Date  COLONOSCOPY WITH PROPOFOL N/A 08/22/2020   Procedure: COLONOSCOPY WITH PROPOFOL;  Surgeon: Lin Landsman, MD;  Location: [facility] ENDOSCOPY;  Service: Gastroenterology;  Laterality: N/A;   CYSTOCELE REPAIR N/A 10/03/2018   Procedure: Anterior Colporrhaphy and Culdoplasty;  Surgeon: Ward, Honor Loh, MD;  Location: [facility] ORS;  Service: Gynecology;  Laterality: N/A;   LAPAROSCOPIC HYSTERECTOMY N/A 10/03/2018   Procedure: HYSTERECTOMY TOTAL LAPAROSCOPIC, bilateral Salpingectomy;  Surgeon: Ward, Honor Loh, MD;  Location: [facility] ORS;  Service:  Gynecology;  Laterality: N/A;   TONSILLECTOMY     TUBAL LIGATION     Social History:  reports that she has been smoking cigarettes. She has been smoking an average of .25 packs per day. She has never used smokeless tobacco. She reports that she does not currently use drugs. She reports that she does not drink alcohol. Family History:  Family History  Problem Relation Age of Onset   Multiple myeloma Mother 10   Bipolar disorder Mother    Diabetes Mother    Heart disease Father    Bipolar disorder Father    Multiple myeloma Sister 20   Bipolar disorder Sister    Prostate cancer Paternal Grandfather      HOME MEDICATIONS: Allergies as of 05/12/2022       Reactions   Bee Venom Anaphylaxis   Ciprofloxacin Anaphylaxis   Iodinated Contrast Media Swelling   Facial swelling  Fingers red and swelling  Facial swelling  Fingers red and swelling    Zofran [ondansetron Hcl] Anaphylaxis   Swelling of the throat and redness of the face   Lactose Intolerance (gi) Diarrhea   Upset stomach   Olanzapine Nausea Only, Other (See Comments)   Seizure like activity Seizure like activity        Medication List        Accurate as of May 12, 2022  9:58 AM. If you have any questions, ask your nurse or doctor.          albuterol 108 (90 Base) MCG/ACT inhaler Commonly known as: VENTOLIN HFA INHALE 1 TO 2 PUFFS INTO THE LUNGS EVERY 6 HOURS AS NEEDED FOR SHORTNESS OF BREATH   ANTI-ITCH EXTRA STRENGTH EX Apply 1 application topically 4 (four) times daily as needed (itching.).   atorvastatin 40 MG tablet Commonly known as: LIPITOR Take 1 tablet (40 mg total) by mouth daily.   BD Pen Needle Nano U/F 32G X 4 MM Misc Generic drug: Insulin Pen Needle 1 DEVICE BY DOES NOT APPLY ROUTE IN THE MORNING, AT NOON, IN THE EVENING, AND AT BEDTIME.   clonazePAM 1 MG tablet Commonly known as: KLONOPIN Take 1 mg by mouth 2 (two) times daily.   cloNIDine 0.3 MG tablet Commonly known as:  CATAPRES Take 1 tablet (0.3 mg total) by mouth 2 (two) times daily.   cyclobenzaprine 10 MG tablet Commonly known as: FLEXERIL Take 1 tablet (10 mg total) by mouth daily as needed for muscle spasms.   Dexcom G7 Sensor Misc 1 Device by Does not apply route as directed.   divalproex 125 MG DR tablet Commonly known as: DEPAKOTE Take 125 mg by mouth 2 (two) times daily.   fluconazole 150 MG tablet Commonly known as: DIFLUCAN Take one tablet by mouth on Day 1. Repeat dose 2nd tablet on Day 3.   FLUoxetine 20 MG capsule Commonly known as: PROZAC Take by mouth.   fluticasone-salmeterol 250-50 MCG/ACT Aepb Commonly known as: ADVAIR TAKE 1 PUFF BY MOUTH TWICE A DAY   InPen 100-Pink-Novolog-Fiasp  Devi Generic drug: injection device for insulin USE AS DIRECTED ONCE (PINK PER PT) (NONFORMULARY) What changed: See the new instructions.   insulin aspart cartridge Commonly known as: NOVOLOG Max daily 25 units   ketorolac 30 MG/ML injection Commonly known as: TORADOL Inject 30 mg into the muscle daily as needed (migraine headaches.).   Linzess 145 MCG Caps capsule Generic drug: linaclotide TAKE 1 CAPSULE BY MOUTH DAILY BEFORE BREAKFAST.   losartan 50 MG tablet Commonly known as: COZAAR Take by mouth.   methylphenidate 10 MG tablet Commonly known as: RITALIN 3 tabs in AM and 3 tabs in PM   metoprolol succinate 50 MG 24 hr tablet Commonly known as: TOPROL-XL Take 1.5 tablets (75 mg total) by mouth daily. Take with or immediately following a meal.   OneTouch Delica Plus 0000000 Misc 1 Device by Does not apply route as directed.   OneTouch Verio test strip Generic drug: glucose blood 1 each by Other route in the morning, at noon, in the evening, and at bedtime. Use as instructed   prazosin 5 MG capsule Commonly known as: MINIPRESS Take 10 mg by mouth at bedtime.   Tyler Aas FlexTouch 100 UNIT/ML FlexTouch Pen Generic drug: insulin degludec Inject 14 Units into the skin  daily.   Visine-A 0.025-0.3 % ophthalmic solution Generic drug: naphazoline-pheniramine Place 1 drop into both eyes 4 (four) times daily as needed for eye irritation.         OBJECTIVE:   Vital Signs: LMP 09/11/2018 (Exact Date)   Wt Readings from Last 3 Encounters:  11/10/21 131 lb 3.2 oz (59.5 kg)  04/04/21 141 lb (64 kg)  10/14/20 172 lb (78 kg)       DM foot exam: 11/10/2021   The skin of the feet is intact without sores or ulcerations. The pedal pulses are 2+ on right and 2+ on left. The sensation is intact to a screening 5.07, 10 gram monofilament bilaterally     DATA REVIEWED:  Lab Results  Component Value Date   HGBA1C 5.4 11/10/2021   HGBA1C 6.5 (A) 04/04/2021   HGBA1C 8.8 (A) 09/27/2020   04/12/2022 BUN 13 Creatinine 0.96 CA 9.2 Glucose 107 GFR 71 TSH 3.03   08/18/2021 Urine albumin/creatinine <4 Old records , labs and images have been reviewed.   ASSESSMENT / PLAN / RECOMMENDATIONS:   1) Type 2 Diabetes Mellitus, optimally controlled, With neuropathic complications - Most recent A1c of 5.4 %. Goal A1c < 7.0%.    -She is intolerant to metformin -She has tried Ozempic and Trulicity with persistent hyperglycemia -She had stopped [place] due to hypoglycemia -Patient would like to remain on insulin as she feels comfortable with using it -In reviewing in pen report, the patient has been noted with tight BG's at 84 mg/DL, will reduce Tyler Aas as below -No change to the NovoLog   MEDICATIONS: -Decrease Tresiba 10 units daily  -Continue  NovoLog 1 unit 3 times daily before every meal -Continue correction factor: NovoLog (BG -130/40) 3 times daily before every meal   EDUCATION / INSTRUCTIONS: BG monitoring instructions: Patient is instructed to check her blood sugars 3 times a day, breakfast lunch dinner times. Call [place] [facility] clinic if: BG persistently < 70 I reviewed the Rule of 15 for the treatment of hypoglycemia in detail with the  patient. Literature supplied.     2) Diabetic complications:  Eye: Does not have known diabetic retinopathy.  Neuro/ Feet: Does not have known diabetic peripheral neuropathy .  Renal: Patient did  have known baseline CKD. She is on an ACEI/ARB at present.  She follows with [facility] nephrology.     F/U in 6 months   Signed electronically by: Mack Guise, MD  [facility] Endocrinology  [facility] [address] [place], [place] [address] Phone: [phone] FAX: [phone]   CC: Tracie Harrier, [place] [address] Phone: [phone]  Fax: [phone]  Return to Endocrinology clinic as below: Future Appointments  Date Time Provider [place]  05/12/2022  1:20 PM Elky Funches, Melanie Crazier, MD [facility] None  05/18/2022  2:00 PM Valente David, RN [facility] None  05/25/2022  1:00 PM Land, [place], LCSW [facility] None

## 2022-05-12 NOTE — Patient Outreach (Signed)
  Care Coordination   Follow Up Visit Note   05/12/2022 Late Entry  Name: Alejandra Frederick MRN: [id number] DOB: Dec 06, 1970  Alejandra Frederick is a 52 y.o. year old female who sees Tracie Harrier, MD for primary care. I spoke with  Alejandra Frederick by phone on 05/11/22.  What matters to the patients health and wellness today?  Follow up with trauma based therapist    Goals Addressed             This [place] and task to complete in order to accomplish goals.   EMOTIONAL / MENTAL HEALTH SUPPORT Call [facility] to schedule initial appointment for ongoing individual counseling Patient to continue group therapy on Thursday nights 7pm-9pm through [place] assessments and interventions completed:  No     Care Coordination Interventions:  Yes, provided  Interventions Today    Flowsheet Row Most Recent Value  Chronic Disease   Chronic disease during today's visit Diabetes, Hypertension (HTN)  General Interventions   General Interventions Discussed/Reviewed General Interventions Reviewed  Mental Health Interventions   Mental Health Discussed/Reviewed Mental Health Reviewed, Coping Strategies, Depression  [patient states that she continues to attend group therapy through her church-confirms that she has completed intake paper work for [facility] and will call to schedule initial intake]       Follow up plan: Follow up call scheduled for 05/25/22 at 1pm    Encounter Outcome:  Pt. Visit Completed

## 2022-05-12 NOTE — Telephone Encounter (Signed)
Sent [employer] [phone]

## 2022-05-12 NOTE — Patient Instructions (Signed)
Visit Information  Thank you for taking time to visit with me today. Please don't hesitate to contact me if I can be of assistance to you.   Following are the goals we discussed today:   Goals Addressed             This Visit's Progress    Mental Health Resources       Activities and task to complete in order to accomplish goals.   EMOTIONAL / [place] Call [facility] to schedule initial appointment for ongoing individual counseling Patient to continue group therapy on Thursday nights 7pm-9pm through [place] next appointment is by telephone on 05/25/22 at 1pm  Please call the care guide team at [phone] if you need to cancel or reschedule your appointment.   If you are experiencing a Mental Health or [place] or need someone to talk to, please call 911   Patient verbalizes understanding of instructions and care plan provided today and agrees to view in [place]. Active MyChart status and patient understanding of how to access instructions and care plan via MyChart confirmed with patient.     Telephone follow up appointment with care management team member scheduled for: 05/25/22  Elliot Gurney, [place] Worker  [facility] Care Management [phone]

## 2022-05-18 ENCOUNTER — Ambulatory Visit: Payer: Self-pay | Admitting: *Deleted

## 2022-05-18 ENCOUNTER — Encounter: Payer: Self-pay | Admitting: Internal Medicine

## 2022-05-18 NOTE — Patient Outreach (Signed)
  Care Coordination   Follow Up Visit Note   05/18/2022 Name: Alejandra Frederick MRN: [id number] DOB: 19-Jan-1971  Alejandra Frederick is a 52 y.o. year old female who sees [name], MD for primary care. I spoke with  Alejandra Frederick by phone today.  What matters to the patients health and wellness today?  Diagnosed with covid last month, still having lingering symptoms, mostly cough and congestion.  She fears it may be pneumonia.  She will go get checked out, but denies current shortness of breath.  State oxygen saturations have remained greater then 92%.    Goals Addressed             This Visit's Progress    RNCM: Effective management of DM   On track    Care Coordination Interventions:  Lab Results  Component Value Date   HGBA1C 5.4 11/10/2021    Provided education to patient about basic DM disease process. Reviewed medications with patient and discussed importance of medication adherence. Review of medications and needs related to medications.  State she is trying to decrease use of insulin as she is now experiencing more lows.  She will discuss with provider.  Counseled on importance of regular laboratory monitoring as prescribed Discussed plans with patient for ongoing care management follow up and provided patient with direct contact information for care management team Provided patient with written educational materials related to hypo and hyperglycemia and importance of correct treatment. The patient is still having critical low readings, had one today during outreach. The patient states that she is checking her blood sugars frequently with her dexcom and she has snacks with her close by at all times. Review of eating frequent small meals and monitoring for episodes of hypoglycemia. State she is eating something at least every hour to prevent hypoglycemia. Reviewed scheduled/upcoming provider appointments including: Review of calling pcp for changes and keeping appointments.  Endocrinology on October, will go to walk in clinic at PCP office on Wednesday to follow up on covid symptoms. Advised patient, providing education and rationale, to check cbg has dexcom 7 and having different readings due to weight loss and record, saw endocrinologist recently who advised the patient not to increase her ss insulin due to chances of hypoglycemia Review of patient status, including review of consultants reports, relevant laboratory and other test results, and medications completed            SDOH assessments and interventions completed:  No     Care Coordination Interventions:  Yes, provided   Interventions Today    Flowsheet Row Most Recent Value  Chronic Disease   Chronic disease during today's visit Diabetes  General Interventions   General Interventions Discussed/Reviewed Doctor Visits  Doctor Visits Discussed/Reviewed Doctor Visits Reviewed, Specialist, PCP  PCP/Specialist Visits Compliance with follow-up visit       Follow up plan: Follow up call scheduled for 4/24    Encounter Outcome:  Pt. Visit Completed   Kemper Durie, RN, MSN, [facility] Care Management Care Management Coordinator [phone]

## 2022-05-25 ENCOUNTER — Telehealth: Payer: Self-pay | Admitting: *Deleted

## 2022-05-25 ENCOUNTER — Encounter: Payer: Self-pay | Admitting: *Deleted

## 2022-05-25 NOTE — Patient Outreach (Signed)
  Care Coordination   Follow Up Visit Note   05/25/2022 Name: Alejandra Frederick MRN: [id number] DOB: 1970-07-23  Alejandra Frederick is a 52 y.o. year old female who sees Alejandra Reichmann, MD for primary care. I spoke with  Alejandra Frederick by phone today.  What matters to the patients health and wellness today?  Mental Health Follow up    Goals Addressed             This Visit's Progress    Mental Health Resources       Activities and task to complete in order to accomplish goals.   EMOTIONAL / MENTAL HEALTH SUPPORT Call [facility] to schedule initial appointment for ongoing individual counseling-will call  when she feels better-intake paper work completed just needs to make an appointment Patient to continue group therapy on Thursday nights 7pm-9pm through [employer]  PCP appointment scheduled for 05/28/22           SDOH assessments and interventions completed:  No     Care Coordination Interventions:  Yes, provided  Interventions Today    Flowsheet Row Most Recent Value  Chronic Disease   Chronic disease during today's visit Diabetes  General Interventions   General Interventions Discussed/Reviewed General Interventions Reviewed, Community Resources  Mental Health Interventions   Mental Health Discussed/Reviewed Mental Health Reviewed, Coping Strategies, Depression  [Follow up with [facility] discussed, intake paper work completed, patient not feeling ,will call to schedule appointment once she feels better]       Follow up plan: Follow up call scheduled for 06/08/22    Encounter Outcome:  Pt. Visit Completed

## 2022-05-25 NOTE — Patient Instructions (Addendum)
Visit Information  Thank you for taking time to visit with me today. Please don't hesitate to contact me if I can be of assistance to you.   Following are the goals we discussed today:   Goals Addressed             This Visit's Progress    Mental Health Resources       Activities and task to complete in order to accomplish goals.   EMOTIONAL / MENTAL HEALTH SUPPORT Call [facility] to schedule initial appointment for ongoing individual counseling-will call  when she feels better-intake paper work completed just needs to make an appointment Patient to continue group therapy on Thursday nights 7pm-9pm through [facility]  PCP appointment scheduled for 05/28/22           Our next appointment is by telephone on 06/08/22 at 2pm  Please call the care guide team at [phone] if you need to cancel or reschedule your appointment.   If you are experiencing a Mental Health or Behavioral Health Crisis or need someone to talk to, please call the Suicide and Crisis Lifeline: 988   Patient verbalizes understanding of instructions and care plan provided today and agrees to view in MyChart. Active MyChart status and patient understanding of how to access instructions and care plan via MyChart confirmed with patient.     Telephone follow up appointment with care management team member scheduled for:06/08/22 Verna Czech, [place] Clinical Social Worker  [facility] Care Management [phone]

## 2022-05-25 NOTE — Patient Outreach (Signed)
  Care Coordination   05/25/2022 Name: Myracle Krausse MRN: [id number] DOB: Feb 10, 1970   Care Coordination Outreach Attempts:  An unsuccessful telephone outreach was attempted today to offer the patient information about available care coordination services as a benefit of their health plan.   Follow Up Plan:  Additional outreach attempts will be made to offer the patient care coordination information and services.   Encounter Outcome:  No Answer   Care Coordination Interventions:  No, not indicated    Coby Shrewsberry, LCSW Clinical Social Worker  [facility] Care Management [phone]

## 2022-06-04 ENCOUNTER — Ambulatory Visit: Payer: Self-pay | Admitting: *Deleted

## 2022-06-04 NOTE — Patient Outreach (Signed)
  Care Coordination   06/04/2022 Name: Alejandra Frederick MRN: [id number] DOB: Dec 06, 1970   Care Coordination Outreach Attempts:  An unsuccessful telephone outreach was attempted for a scheduled appointment today.  Follow Up Plan:  Additional outreach attempts will be made to offer the patient care coordination information and services.   Encounter Outcome:  No Answer   Care Coordination Interventions:  No, not indicated    Kemper Durie, RN, MSN, [facility] Care Management Care Management Coordinator [phone]

## 2022-06-08 ENCOUNTER — Ambulatory Visit: Payer: Self-pay | Admitting: *Deleted

## 2022-06-08 NOTE — Patient Outreach (Addendum)
  Care Coordination   Follow Up Visit Note   06/08/2022 Name: Alejandra Frederick MRN: [id number] DOB: [birth year]  Alejandra Frederick is a 52 y.o. year old female who sees [name], MD for primary care. I spoke with  [name] by phone today.  What matters to the patients health and wellness today?  Mental Health Follow up, patient has been referred [facility], however due to illness have not been able to schedule initial appointment. Patient continues to follow up with [facility] until her condition improves.   Goals Addressed             This Visit's Progress    Mental Health Resources       Activities and task to complete in order to accomplish goals.   EMOTIONAL / MENTAL HEALTH SUPPORT Call [facility] to schedule initial appointment for ongoing individual counseling-will call  when she feels better-intake paper work completed just needs to make an appointment Patient to continue group therapy on Thursday nights 7pm-9pm through [facility]  Patient to continue individual counseling on Tuesday nights through [name]           SDOH assessments and interventions completed:  No     Care Coordination Interventions:  Yes, provided  Interventions Today    Flowsheet Row Most Recent Value  Chronic Disease   Chronic disease during today's visit Diabetes, Other  [per patient-long covid]  General Interventions   General Interventions Discussed/Reviewed General Interventions Reviewed, Community Resources  Mental Health Interventions   Mental Health Discussed/Reviewed Mental Health Reviewed  [Divorce not final, working on serving estranged husband again-]  Nutrition Interventions   Nutrition Discussed/Reviewed Nutrition Discussed  [per patient food stamps have been decreased to 116.00 per month]  Safety Interventions   Safety Discussed/Reviewed Safety Discussed  [No longer has restraining order against estranged spouse,  patient encouraged to  contacting 911 in the event of enimnent danger]       Follow up plan: Follow up call scheduled for 06/29/22    Encounter Outcome:  Pt. Visit Completed

## 2022-06-08 NOTE — Patient Instructions (Signed)
Visit Information  Thank you for taking time to visit with me today. Please don't hesitate to contact me if I can be of assistance to you.   Following are the goals we discussed today:   Goals Addressed             This Visit's Progress    Mental Health Resources       Activities and task to complete in order to accomplish goals.   EMOTIONAL / MENTAL HEALTH SUPPORT Call [facility] to schedule initial appointment for ongoing individual counseling-will call  when she feels better-intake paper work completed just needs to make an appointment Patient to continue group therapy on Thursday nights 7pm-9pm through [employer]  Patient to continue individual counseling on Tuesday nights through [name]           Our next appointment is by telephone on 06/29/22 at 2pm  Please call the care guide team at [phone] if you need to cancel or reschedule your appointment.   If you are experiencing a Mental Health or Behavioral Health Crisis or need someone to talk to, please call [phone]   Patient verbalizes understanding of instructions and care plan provided today and agrees to view in MyChart. Active MyChart status and patient understanding of how to access instructions and care plan via MyChart confirmed with patient.     Telephone follow up appointment with care management team member scheduled for:   [facility], [place] Clinical Social Worker  [facility] Care Management [phone]

## 2022-06-18 ENCOUNTER — Ambulatory Visit: Payer: Self-pay | Admitting: *Deleted

## 2022-06-18 NOTE — Patient Outreach (Signed)
  Care Coordination   Follow Up Visit Note   06/18/2022 Name: Abegail Mccalvin MRN: [id number] DOB: 1971-02-02  Hisako Jefferis is a 52 y.o. year old female who sees [name], MD for primary care. I spoke with  [name] by phone today.  What matters to the patients health and wellness today?  Establish with new provider to help better manage diabetes    Goals Addressed             This Visit's Progress    RNCM: Effective management of DM   Not on track    Care Coordination Interventions:  Lab Results  Component Value Date   HGBA1C 5.4 11/10/2021    Provided education to patient about basic DM disease process. Reviewed medications with patient and discussed importance of medication adherence. Review of medications and needs related to medications.  State she is trying to decrease use of insulin as she is now experiencing more lows.  She will discuss with provider.  Counseled on importance of regular laboratory monitoring as prescribed Discussed plans with patient for ongoing care management follow up and provided patient with direct contact information for care management team Provided patient with written educational materials related to hypo and hyperglycemia and importance of correct treatment. The patient is still having critical low readings, had one today during outreach. The patient states that she is checking her blood sugars frequently with her dexcom and she has snacks with her close by at all times. Review of eating frequent small meals and monitoring for episodes of hypoglycemia. State she is eating something at least every hour to prevent hypoglycemia. Advised patient, providing education and rationale, to check cbg has dexcom 7 and having different readings due to weight loss and record, saw endocrinologist recently who advised the patient not to increase her ss insulin due to chances of hypoglycemia Review of patient status, including review of consultants  reports, relevant laboratory and other test results, and medications completed            SDOH assessments and interventions completed:  No     Care Coordination Interventions:  Yes, provided   / Interventions Today    Flowsheet Row Most Recent Value  Chronic Disease   Chronic disease during today's visit Diabetes  General Interventions   General Interventions Discussed/Reviewed General Interventions Reviewed, Doctor Visits  Doctor Visits Discussed/Reviewed Doctor Visits Reviewed, Specialist  [last seen by endocrinology 4/3, does not have follow up scheduled as she feels she will be better managed by [facility] team.  She has referral, will call their office to schedule appointment]  PCP/Specialist Visits Compliance with follow-up visit  Education Interventions   Education Provided Provided Education  Provided Verbal Education On Blood Sugar Monitoring, Medication  [Reviewed glucose trends(2% greater than 250, 87% low between 54-69, and 10% within target range of 70-180).  She has been decreasing her Tresiba dose by 1 unit every couple days to decrease hypoglycemia]      Follow up plan: Follow up call scheduled for 6/12    Encounter Outcome:  Pt. Visit Completed   Kemper Durie, RN, MSN, [facility] Care Management Care Management Coordinator [phone]

## 2022-06-19 ENCOUNTER — Other Ambulatory Visit: Payer: Self-pay

## 2022-06-19 DIAGNOSIS — E1140 Type 2 diabetes mellitus with diabetic neuropathy, unspecified: Secondary | ICD-10-CM

## 2022-06-19 MED ORDER — INPEN 100-PINK-NOVOLOG-FIASP DEVI: 3 refills | Status: DC

## 2022-06-23 ENCOUNTER — Other Ambulatory Visit: Payer: Self-pay

## 2022-06-23 MED ORDER — INPEN 100-PINK-NOVOLOG-FIASP DEVI: 3 refills | Status: DC

## 2022-06-29 ENCOUNTER — Ambulatory Visit: Payer: Self-pay | Admitting: *Deleted

## 2022-06-29 NOTE — Patient Instructions (Signed)
Visit Information  Thank you for taking time to visit with me today. Please don't hesitate to contact me if I can be of assistance to you.   Following are the goals we discussed today:   Goals Addressed             This Visit's Progress    Mental Health Resources       Activities and task to complete in order to accomplish goals.   EMOTIONAL / MENTAL HEALTH SUPPORT  Patient to continue group therapy on Thursday nights 7pm-9pm through [facility]  Patient to continue individual counseling on Tuesday nights through [name]           Our next appointment is by telephone on 07/20/22 at 1pm  Please call the care guide team at [phone] if you need to cancel or reschedule your appointment.   If you are experiencing a Mental Health or Behavioral Health Crisis or need someone to talk to, please call the Suicide and Crisis Lifeline: 988   Patient verbalizes understanding of instructions and care plan provided today and agrees to view in MyChart. Active MyChart status and patient understanding of how to access instructions and care plan via MyChart confirmed with patient.     Telephone follow up appointment with care management team member scheduled for: 07/20/22  Verna Czech, LCSW Clinical Social Worker  [facility] Care Management [phone]

## 2022-06-29 NOTE — Patient Outreach (Signed)
  Care Coordination   Follow Up Visit Note   06/29/2022 Name: Alejandra Frederick MRN: [id number] DOB: 19-Feb-1970  Alejandra Frederick is a 52 y.o. year old female who sees [name], MD for primary care. I spoke with  [name] by phone today.  What matters to the patients health and wellness today?  Mental health follow up    Goals Addressed             This Visit's Progress    Mental Health Resources       Activities and task to complete in order to accomplish goals.   EMOTIONAL / MENTAL HEALTH SUPPORT  Patient to continue group therapy on Thursday nights 7pm-9pm through [facility]  Patient to continue individual counseling on Tuesday nights through [name]           SDOH assessments and interventions completed:  No     Care Coordination Interventions:  Yes, provided  Interventions Today    Flowsheet Row Most Recent Value  Chronic Disease   Chronic disease during today's visit Diabetes, Other  [depression]  General Interventions   General Interventions Discussed/Reviewed [name]]  Doctor Visits Discussed/Reviewed Doctor Visits Discussed, Specialist  [per patient, discussed possible follow up with a ENT due to sinus issues]  Level of Care Applications  Applications Medicaid  [patient will need to choose a clinical home by June 1, Medicaid will switch to [place]-concerned about her medications and switching her providers]  Education Interventions   Applications Medicaid  [patient will need to choose a clinical home by June 1, Medicaid will switch to [place]-concerned about her medications and switching her providers]  Mental Health Interventions   Mental Health Discussed/Reviewed Mental Health Reviewed  [patient has missed group therapy for the last three weeks [facility] but will attend this Thursday night-has not [facility]-needs to provide payment method]  Safety Interventions   Safety Discussed/Reviewed Safety Reviewed  [patient encouraged to call 988 in the event of a mental health crisis]       Follow up plan: Follow up call scheduled for 07/20/22    Encounter Outcome:  Pt. Visit Completed

## 2022-07-16 ENCOUNTER — Telehealth: Payer: Self-pay | Admitting: *Deleted

## 2022-07-17 NOTE — Patient Instructions (Signed)
Visit Information  Thank you for taking time to visit with me today. Please don't hesitate to contact me if I can be of assistance to you.   Following are the goals we discussed today:   Goals Addressed             This Visit's Progress    Insurance confirmation       Interventions Today    Flowsheet Row Most Recent Value  Chronic Disease   Chronic disease during today's visit Diabetes, Other  [depression]  General Interventions   Level of Care Applications  Applications Medicaid  [Pt confirmed that she is no longer eligible for full medicaid now as the medicaid that pays her premiums only-confirmed that her Medicare will now be her primary coverage. confirmed that patient remains eligible for care coordination despite this change]  Education Interventions   Applications Medicaid  [Pt confirmed that she is no longer eligible for full medicaid now as the medicaid that pays her premiums only-confirmed that her Medicare will now be her primary coverage. confirmed that patient remains eligible for care coordination despite this change]              Our next appointment is by telephone on 07/20/22 at 1pm  Please call the care guide team at [phone] if you need to cancel or reschedule your appointment.   If you are experiencing a Mental Health or Behavioral Health Crisis or need someone to talk to, please call the Suicide and Crisis Lifeline: 988  call the Suicide and Crisis Lifeline: 988 Patient verbalizes understanding of instructions and care plan provided today and agrees to view in MyChart. Active MyChart status and patient understanding of how to access instructions and care plan via MyChart confirmed with patient.     Telephone follow up appointment with care management team member scheduled for: 07/20/22  Verna Czech, LCSW Clinical Social Worker  [facility] Care Management [phone]

## 2022-07-17 NOTE — Patient Outreach (Signed)
  Care Coordination   Follow Up Visit Note   07/17/2022 Name: Alejandra Frederick MRN: [id number] DOB: Dec 10, 1970  Alejandra Frederick is a 52 y.o. year old female who sees [name], MD for primary care. I spoke with  Alejandra Frederick by phone today.  What matters to the patients health and wellness today?  Patient called to confirm that she is no longer covered under Medicaid, they are only paying her Medicare premiums. Patient concerned about continued care coordination follow up due to this change   Goals Addressed             This Visit's Progress    Insurance confirmation       Interventions Today    Flowsheet Row Most Recent Value  Chronic Disease   Chronic disease during today's visit Diabetes, Other  [depression]  General Interventions   Level of Care Applications  Applications Medicaid  [Pt confirmed that she is no longer eligible for full medicaid now as the medicaid that pays her premiums only-confirmed that her Medicare will now be her primary coverage. confirmed that patient remains eligible for care coordination despite this change]  Education Interventions   Applications Medicaid  [Pt confirmed that she is no longer eligible for full medicaid now as the medicaid that pays her premiums only-confirmed that her Medicare will now be her primary coverage. confirmed that patient remains eligible for care coordination despite this change]              SDOH assessments and interventions completed:  No     Care Coordination Interventions:  Yes, provided   Follow up plan: Follow up call scheduled for 07/20/22    Encounter Outcome:  Pt. Visit Completed

## 2022-07-20 ENCOUNTER — Ambulatory Visit: Payer: Self-pay | Admitting: *Deleted

## 2022-07-20 NOTE — Patient Instructions (Signed)
Visit Information  Thank you for taking time to visit with me today. Please don't hesitate to contact me if I can be of assistance to you.   Following are the goals we discussed today:   Goals Addressed             This Visit's Progress    Mental Health Resources       Activities and task to complete in order to accomplish goals.   EMOTIONAL / MENTAL HEALTH SUPPORT  Patient to re-start group therapy on Thursday nights 7pm-9pm through [facility]  Patient to re-start individual counseling on Tuesday nights through [name] Patient will contact [facility] to schedule intake for ongoing mental health follow up [phone]           Our next appointment is by telephone on 08/10/22 at 1pm  Please call the care guide team at [phone] if you need to cancel or reschedule your appointment.   If you are experiencing a Mental Health or Behavioral Health Crisis or need someone to talk to, please call the Suicide and Crisis Lifeline: 988   Patient verbalizes understanding of instructions and care plan provided today and agrees to view in MyChart. Active MyChart status and patient understanding of how to access instructions and care plan via MyChart confirmed with patient.     Telephone follow up appointment with care management team member scheduled for: 08/10/22  Verna Czech, LCSW Clinical Social Worker  [facility] Care Management [phone]

## 2022-07-20 NOTE — Patient Outreach (Signed)
  Care Coordination   Follow Up Visit Note   07/20/2022 Name: Alejandra Frederick MRN: [id number] DOB: 1970/03/26  Alejandra Frederick is a 52 y.o. year old female who sees [name], MD for primary care. I spoke with  [name] by phone today.  What matters to the patients health and wellness today?  Ongoing mental health follow up    Goals Addressed             This Visit's Progress    Mental Health Resources       Activities and task to complete in order to accomplish goals.   EMOTIONAL / MENTAL HEALTH SUPPORT  Patient to re-start group therapy on Thursday nights 7pm-9pm through [facility]  Patient to re-start individual counseling on Tuesday nights through [name] Patient will contact [facility] to schedule intake for ongoing mental health follow up [phone]           SDOH assessments and interventions completed:  No     Care Coordination Interventions:  Yes, provided  Interventions Today    Flowsheet Row Most Recent Value  Chronic Disease   Chronic disease during today's visit Diabetes, Other  [depression]  General Interventions   General Interventions Discussed/Reviewed --  [Follow up with mental health provider for ongoing therapy continues to be encouraged. Resources previously provided, benefits of trauma therapy discussed.]  Doctor Visits Discussed/Reviewed Doctor Visits Reviewed  [Dr. Gearldine Bienenstock, PCP [facility] 07/21/22]  Level of Care Applications  Applications Medicaid  [patient will have full medicaid until 7/1 and will switch to [facility] which will pay her premiums]  Education Interventions   Applications Medicaid  [patient will have full medicaid until 7/1 and will switch to [facility] which will pay her premiums]  Mental Health Interventions   Mental Health Discussed/Reviewed Mental Health Reviewed  [Pt to continue plan to f/u with [facility] on Thursday nights , they are on break now, patient encouraged to call to get  re-start date [facility] [place] [facility] has provided patient with an additional list of mental health providers to research and sched]  Safety Interventions   Safety Discussed/Reviewed Safety Reviewed  [patient encouraged to call [phone] in the event of a mental health crisis]       Follow up plan: Follow up call scheduled for 08/10/22    Encounter Outcome:  Pt. Visit Completed

## 2022-07-22 ENCOUNTER — Encounter: Payer: Self-pay | Admitting: *Deleted

## 2022-08-03 ENCOUNTER — Ambulatory Visit: Payer: Self-pay | Admitting: *Deleted

## 2022-08-03 NOTE — Patient Outreach (Signed)
  Care Coordination   08/03/2022 Name: Alejandra Frederick MRN: [id number] DOB: 1970/07/10   Care Coordination Outreach Attempts:  An unsuccessful telephone outreach was attempted for a scheduled appointment today.  Follow Up Plan:  Additional outreach attempts will be made to offer the patient care coordination information and services.   Encounter Outcome:  No Answer   Care Coordination Interventions:  No, not indicated    Kemper Durie, RN, MSN, [facility] Care Management Care Management Coordinator [phone]

## 2022-08-10 ENCOUNTER — Ambulatory Visit: Payer: Self-pay | Admitting: *Deleted

## 2022-08-10 NOTE — Patient Outreach (Signed)
  Care Coordination   08/10/2022 Name: Alejandra Frederick MRN: [id number] DOB: 1970/11/28   Care Coordination Outreach Attempts:  An unsuccessful telephone outreach was attempted today to offer the patient information about available care coordination services.  Follow Up Plan:  Additional outreach attempts will be made to offer the patient care coordination information and services.   Encounter Outcome:  No Answer   Care Coordination Interventions:  No, not indicated    Ayven Glasco, LCSW Clinical Social Worker  [facility] Care Management [phone]

## 2022-08-20 ENCOUNTER — Ambulatory Visit: Payer: Self-pay | Admitting: *Deleted

## 2022-08-20 NOTE — Patient Outreach (Signed)
  Care Coordination   Follow Up Visit Note   08/20/2022 Name: Alejandra Frederick MRN: [id number] DOB: 15-Sep-1970  Alejandra Frederick is a 52 y.o. year old female who sees Alejandra Reichmann, MD for primary care. I spoke with  Alejandra Frederick by phone today.  What matters to the patients health and wellness today?  Patient states that her divorce is now final, currently working on permanent residence and Medicaid home. [facility] working with [facility], who also provides care management services. Patient continues to receive mental health support through [facility]. Patient denies having any additional community resource needs at this time.  Goals Addressed             This Visit's Progress    Mental Health Resources       Interventions Today    Flowsheet Row Most Recent Value  Chronic Disease   Chronic disease during today's visit Hypertension (HTN)  General Interventions   General Interventions Discussed/Reviewed General Interventions Discussed  [Pt reports feeling better, covid syptoms/congestion have resolved. Pt now divorced (7/11) considering next steps re: permanent residency and what county is her medicaid coverage in ([place] or [place]) so that she can seek services in that County]  Mental Health Interventions   Mental Health Discussed/Reviewed Mental Health Discussed  [confirmed continued counseling with [facility], still looking for CBT follow up, currently receives case management services through [facility] [place]]                SDOH assessments and interventions completed:  No     Care Coordination Interventions:  Yes, provided   Follow up plan: No further intervention required.   Encounter Outcome:  Pt. Visit Completed

## 2022-08-20 NOTE — Patient Instructions (Signed)
Visit Information  Thank you for taking time to visit with me today. Please don't hesitate to contact me if I can be of assistance to you.   Following are the goals we discussed today:  Please continue to follow up with the Department of Social Services to determine Medicaid home [facility]) Please continue to follow up with [facility] for mental health support  If you are experiencing a Mental Health or Behavioral Health Crisis or need someone to talk to, please call the Suicide and Crisis Lifeline: 988   Patient verbalizes understanding of instructions and care plan provided today and agrees to view in [place]. Active MyChart status and patient understanding of how to access instructions and care plan via MyChart confirmed with patient.     No further follow up required: patient to contact this Child psychotherapist with any additional community resource needs   [employer], LCSW Clinical Social Worker  [facility] Care Management [phone]

## 2022-09-02 ENCOUNTER — Ambulatory Visit: Payer: Self-pay | Admitting: *Deleted

## 2022-09-02 NOTE — Patient Outreach (Signed)
  Care Coordination   09/02/2022 Name: Alejandra Frederick MRN: [id number] DOB: 1970/02/14   Care Coordination Outreach Attempts:  An unsuccessful telephone outreach was attempted for a scheduled appointment today.  Follow Up Plan:  Additional outreach attempts will be made to offer the patient care coordination information and services.   Encounter Outcome:  No Answer   Care Coordination Interventions:  No, not indicated    Kemper Durie, RN, MSN, [facility] Care Management Care Management Coordinator [phone]

## 2022-09-15 ENCOUNTER — Telehealth: Payer: Self-pay | Admitting: *Deleted

## 2022-09-15 NOTE — Patient Outreach (Signed)
  Care Coordination   Follow Up Visit Note   09/15/2022 Name: Alejandra Frederick MRN: [id number] DOB: January 09, 1971  Alejandra Frederick is a 52 y.o. year old female who sees Alejandra Reichmann, MD for primary care. I spoke with  Alejandra Frederick by phone today.  What matters to the patients health and wellness today?  Keep blood sugars under control and transfer Medicaid/Medicare to new county.  Advised to call case worker again    Goals Addressed             This Visit's Progress    COMPLETED: RNCM: Effective management of DM   On track    Care Coordination Interventions:   Provided education to patient about basic DM disease process. Reviewed medications with patient and discussed importance of medication adherence. Review of medications and needs related to medications.  State she is trying to decrease use of insulin as she is now experiencing more lows.  She will discuss with provider.  Counseled on importance of regular laboratory monitoring as prescribed Discussed plans with patient for ongoing care management follow up and provided patient with direct contact information for care management team Provided patient with written educational materials related to hypo and hyperglycemia and importance of correct treatment. The patient is still having critical low readings, had one today during outreach. The patient states that she is checking her blood sugars frequently with her dexcom and she has snacks with her close by at all times. Review of eating frequent small meals and monitoring for episodes of hypoglycemia. State she is eating something at least every hour to prevent hypoglycemia. Advised patient, providing education and rationale, to check cbg has dexcom 7 and having different readings due to weight loss and record, saw endocrinologist recently who advised the patient not to increase her ss insulin due to chances of hypoglycemia Review of patient status, including review of consultants reports,  relevant laboratory and other test results, and medications completed  8/6 - Goal remains ongoing, will continue to work on goals with current PCP            SDOH assessments and interventions completed:  No     Care Coordination Interventions:  Yes, provided   Interventions Today    Flowsheet Row Most Recent Value  Chronic Disease   Chronic disease during today's visit Diabetes  General Interventions   General Interventions Discussed/Reviewed General Interventions Reviewed, Doctor Visits  Doctor Visits Discussed/Reviewed PCP, Doctor Visits Reviewed  [State she has appointment with PCP soon, currently seeing Dr. Vickey Sages Frederick]  PCP/Specialist Visits Compliance with follow-up visit  Education Interventions   Education Provided Provided Education  Provided Verbal Education On Nutrition, Blood Sugar Monitoring, Medication, When to see the doctor, Sick Day Rules        Follow up plan: No further intervention required.   Encounter Outcome:  Pt. Visit Completed   Kemper Durie, RN, MSN, [facility] Care Management Care Management Coordinator [phone]

## 2022-11-03 ENCOUNTER — Telehealth: Payer: Self-pay | Admitting: Internal Medicine

## 2022-11-03 NOTE — Telephone Encounter (Addendum)
MEDICATION:  Dexcom G7 Sensor Continuous Blood Gluc Sensor (DEXCOM G7 SENSOR) MISC  PHARMACY:  [facility]  HAS THE PATIENT CONTACTED THEIR PHARMACY?  Yes  IS THIS A 90 DAY SUPPLY : Yes  IS PATIENT OUT OF MEDICATION: No  IF NOT; HOW MUCH IS LEFT: Will be out in October 2024  LAST APPOINTMENT DATE: @ 05/12/2022  NEXT APPOINTMENT DATE:@Visit  date not found  DO WE HAVE YOUR PERMISSION TO LEAVE A DETAILED MESSAGE?: Yes  OTHER COMMENTS: Patient is calling to say that [facility] has contacted her about the refill for her Dexcom G7 supplies saying that they need office notes after her 05/12/2022 visit.  Patient states that she has been treated with COVID and now has a contagious form of pneumonia.  Patient states that she has had notes sent over under her maiden name Degioanni because she is now divorced from her husband, but patient has noty changed her name legally.  Patient states that notes were sent over via Fax.  Patient also states that if Dr. Lonzo Cloud needs to see her that a family member can bring her in but she is 2-1/2 hours away for the time being. Patient would like a return call to confirm that the notes she sent will be sufficient for [facility] so that she can get her supplies.  **Let patient know to contact pharmacy at the end of the day to make sure medication is ready. **  ** Please notify patient to allow 48-72 hours to process**  **Encourage patient to contact the pharmacy for refills or they can request refills through [facility]**

## 2022-11-04 ENCOUNTER — Encounter: Payer: Self-pay | Admitting: Internal Medicine

## 2022-11-04 ENCOUNTER — Telehealth: Payer: Medicare Other | Admitting: Internal Medicine

## 2022-11-04 ENCOUNTER — Telehealth: Payer: Self-pay | Admitting: Internal Medicine

## 2022-11-04 VITALS — Ht 63.0 in

## 2022-11-04 DIAGNOSIS — E1140 Type 2 diabetes mellitus with diabetic neuropathy, unspecified: Secondary | ICD-10-CM | POA: Diagnosis not present

## 2022-11-04 DIAGNOSIS — Z794 Long term (current) use of insulin: Secondary | ICD-10-CM

## 2022-11-04 NOTE — Telephone Encounter (Signed)
LMTCB as patient needs a office visit before the can process order. Patient last visit was in April and has to be every 6 months.

## 2022-11-04 NOTE — Progress Notes (Signed)
Virtual Visit via Video Note  I connected with Alejandra Frederick on 11/04/22 at 1:20 pm  by a video enabled telemedicine application and verified that I am speaking with the correct person using two identifiers.   I discussed the limitations of evaluation and management by telemedicine and the availability of in person appointments. The patient expressed understanding and agreed to proceed.   -Location of the patient :home  -Location of the provider : Office -The names of all persons participating in the telemedicine service : Pt and myself       Name: Alejandra Frederick  Age/ Sex: 52 y.o., female   MRN/ DOB: [id number], 1970-10-15     PCP: Pcp, No   Reason for Endocrinology Evaluation: Type 2 Diabetes Mellitus  Initial Endocrine Consultative Visit:  04/04/2020    PATIENT IDENTIFIER: Alejandra Frederick is a 52 y.o. female with a past medical history of T2DM, Bipolar disorder, HTN , and Dyslipidemia.. The patient has followed with Endocrinology clinic since 04/04/2020 for consultative assistance with management of her diabetes.  DIABETIC HISTORY:  Alejandra Frederick was diagnosed with DM at age 60, this started as gestational diabetes.  She is intolerant to metformin due to GI side effects, she has also been tried on Ozempic/Trulicity as well as Jardiance. Her hemoglobin A1c has ranged from 7.5% in 2018, peaking at 7.6% in 2020.  On her initial visit to our clinic she had an A1c of 8.5%, she was on basal insulin as well as using NovoLog per sliding scale, we provided her with a correction scale, continue [place] and started her on Farxiga. SUBJECTIVE:   During the last visit (05/12/2022): This was a virtual visit   Today (11/04/2022): Alejandra Frederick is here for a follow up on diabetes management.  She checks her blood sugars multiple  times through CGM.  We are unable to download her Dexcom because she uses the receiver.  She has been noted with hypoglycemia, she is symptomatic with these episodes.  She  initially was diagnosed with COVID, and subsequently has been diagnosed with mycoplasma pneumonia, patient states she has been on multiple antibiotics, she continues with cough, fever, nausea, vomiting and diarrhea  She has increased her Tresiba from 12 to 16 units due to persistent morning hyperglycemia.       HOME DIABETES REGIMEN:  Evaristo Bury 12  units daily- takes 16 units  Novolog 1 unit TIAQAC CF : NovoLog ( Bg-130/40)       Statin: yes ACE-I/ARB: yes   CONTINUOUS GLUCOSE MONITORING RECORD INTERPRETATION : Unable to download    In Pen report           DIABETIC COMPLICATIONS: Microvascular complications:  CKD III Denies: retinopathy, neuropathy Last Eye Exam: Completed 2021  Macrovascular complications:   Denies: CAD, CVA, PVD   HISTORY:  Past Medical History:  Past Medical History:  Diagnosis Date   Anxiety    Asthma    Cervical cancer (HCC) 2007   surgical resection to freeze cells.    Complication of anesthesia    hard to wake up    Depression    Gastroparesis    Headache    High cholesterol    Hypertension    Myocardial infarction [facility]) 2016   Pulmonary embolus (HCC)    Stroke (HCC) 2013   Past Surgical History:  Past Surgical History:  Procedure Laterality Date   COLONOSCOPY WITH PROPOFOL N/A 08/22/2020   Procedure: COLONOSCOPY WITH PROPOFOL;  Surgeon: Toney Reil, MD;  Location: [facility] ENDOSCOPY;  Service: Gastroenterology;  Laterality: N/A;   CYSTOCELE REPAIR N/A 10/03/2018   Procedure: Anterior Colporrhaphy and Culdoplasty;  Surgeon: Ward, Elenora Fender, MD;  Location: [facility] ORS;  Service: Gynecology;  Laterality: N/A;   LAPAROSCOPIC HYSTERECTOMY N/A 10/03/2018   Procedure: HYSTERECTOMY TOTAL LAPAROSCOPIC, bilateral Salpingectomy;  Surgeon: Ward, Elenora Fender, MD;  Location: [facility] ORS;  Service: Gynecology;  Laterality: N/A;   TONSILLECTOMY     TUBAL LIGATION     Social History:  reports that she has been smoking cigarettes. She has  never used smokeless tobacco. She reports that she does not currently use drugs. She reports that she does not drink alcohol. Family History:  Family History  Problem Relation Age of Onset   Multiple myeloma Mother 24   Bipolar disorder Mother    Diabetes Mother    Heart disease Father    Bipolar disorder Father    Multiple myeloma Sister 53   Bipolar disorder Sister    Prostate cancer Paternal Grandfather      HOME MEDICATIONS: Allergies as of 11/04/2022       Reactions   Bee Venom Anaphylaxis   Ciprofloxacin Anaphylaxis   Iodinated Contrast Media Swelling   Facial swelling  Fingers red and swelling  Facial swelling  Fingers red and swelling    Zofran [ondansetron Hcl] Anaphylaxis   Swelling of the throat and redness of the face   Lactose Intolerance (gi) Diarrhea   Upset stomach   Olanzapine Nausea Only, Other (See Comments)   Seizure like activity Seizure like activity        Medication List        Accurate as of November 04, 2022 12:26 PM. If you have any questions, ask your nurse or doctor.          albuterol 108 (90 Base) MCG/ACT inhaler Commonly known as: VENTOLIN HFA INHALE 1 TO 2 PUFFS INTO THE LUNGS EVERY 6 HOURS AS NEEDED FOR SHORTNESS OF BREATH   ANTI-ITCH EXTRA STRENGTH EX Apply 1 application topically 4 (four) times daily as needed (itching.).   atorvastatin 40 MG tablet Commonly known as: LIPITOR Take 1 tablet (40 mg total) by mouth daily.   BD Pen Needle Nano U/F 32G X 4 MM Misc Generic drug: Insulin Pen Needle 1 DEVICE BY DOES NOT APPLY ROUTE IN THE MORNING, AT NOON, IN THE EVENING, AND AT BEDTIME.   cloNIDine 0.3 MG tablet Commonly known as: CATAPRES Take 1 tablet (0.3 mg total) by mouth 2 (two) times daily.   cyclobenzaprine 10 MG tablet Commonly known as: FLEXERIL Take 1 tablet (10 mg total) by mouth daily as needed for muscle spasms.   Dexcom G7 Sensor Misc 1 Device by Does not apply route as directed.   divalproex 125 MG  DR tablet Commonly known as: DEPAKOTE Take 125 mg by mouth 2 (two) times daily.   fluconazole 150 MG tablet Commonly known as: DIFLUCAN Take one tablet by mouth on Day 1. Repeat dose 2nd tablet on Day 3.   FLUoxetine 20 MG capsule Commonly known as: PROZAC Take by mouth.   fluticasone-salmeterol 250-50 MCG/ACT Aepb Commonly known as: ADVAIR TAKE 1 PUFF BY MOUTH TWICE A DAY   InPen 100-Pink-Novolog-Fiasp Devi Generic drug: injection device for insulin Patient says her units varies with her sugar readings   insulin aspart cartridge Commonly known as: NOVOLOG Max daily 25 units   ketorolac 30 MG/ML injection Commonly known as: TORADOL Inject 30 mg into the muscle daily as needed (migraine headaches.).   Linzess  145 MCG Caps capsule Generic drug: linaclotide TAKE 1 CAPSULE BY MOUTH DAILY BEFORE BREAKFAST.   losartan 50 MG tablet Commonly known as: COZAAR Take by mouth.   methylphenidate 10 MG tablet Commonly known as: RITALIN 3 tabs in AM and 3 tabs in PM   metoprolol succinate 50 MG 24 hr tablet Commonly known as: TOPROL-XL Take 1.5 tablets (75 mg total) by mouth daily. Take with or immediately following a meal.   OneTouch Delica Plus Lancet33G Misc 1 Device by Does not apply route as directed.   OneTouch Verio test strip Generic drug: glucose blood 1 each by Other route in the morning, at noon, in the evening, and at bedtime. Use as instructed   prazosin 5 MG capsule Commonly known as: MINIPRESS Take 10 mg by mouth at bedtime.   Evaristo Bury FlexTouch 100 UNIT/ML FlexTouch Pen Generic drug: insulin degludec Inject 10 Units into the skin daily.   Visine-A 0.025-0.3 % ophthalmic solution Generic drug: naphazoline-pheniramine Place 1 drop into both eyes 4 (four) times daily as needed for eye irritation.         OBJECTIVE:   Vital Signs: LMP 09/11/2018 (Exact Date)   Wt Readings from Last 3 Encounters:  11/10/21 131 lb 3.2 oz (59.5 kg)  04/04/21 141 lb  (64 kg)  10/14/20 172 lb (78 kg)       DM foot exam: 11/10/2021   The skin of the feet is intact without sores or ulcerations. The pedal pulses are 2+ on right and 2+ on left. The sensation is intact to a screening 5.07, 10 gram monofilament bilaterally     DATA REVIEWED:  Lab Results  Component Value Date   HGBA1C 5.4 11/10/2021   HGBA1C 6.5 (A) 04/04/2021   HGBA1C 8.8 (A) 09/27/2020   10/09/2022 BUN 14 Creatinine 0.77 CA 9.2 Gluc 137  GFR > 90 TSH 0.217   08/18/2021 Urine albumin/creatinine <4   Old records , labs and images have been reviewed.   ASSESSMENT / PLAN / RECOMMENDATIONS:   1) Type 2 Diabetes Mellitus, optimally controlled, With neuropathic complications - Goal A1c < 7.0%.    -She is intolerant to metformin -She has tried Ozempic and Trulicity with persistent hyperglycemia -She had stopped [place] due to hypoglycemia -Patient would like to remain on insulin as she feels comfortable with using it -I have recommended reducing Evaristo Bury as below to prevent overnight hypoglycemia -Patient was also advised to increase her prandial insulin with breakfast, as she seems to have the highest rise around that time -No changes to prandial dose of insulin with lunch and supper -No changes to correction scale at this time    MEDICATIONS: -Decrease Tresiba 14 units daily  -Change   NovoLog 2 unit with breakfast, 1 unit with lunch, 1 unit with supper  -Continue correction factor: NovoLog (BG -130/40) 3 times daily before every meal   EDUCATION / INSTRUCTIONS: BG monitoring instructions: Patient is instructed to check her blood sugars 3 times a day, breakfast lunch dinner times. Call [facility] if: BG persistently < 70 I reviewed the Rule of 15 for the treatment of hypoglycemia in detail with the patient. Literature supplied.     2) Diabetic complications:  Eye: Does not have known diabetic retinopathy.  Neuro/ Feet: Does have known  diabetic peripheral neuropathy .  Renal: Patient did  have known baseline CKD. She is on an ACEI/ARB at present.  She follows with [facility] nephrology.     F/U in 5 months  Signed electronically by: Lyndle Herrlich, MD  [facility] Endocrinology  [facility] [address] [place], [place] [address] Phone: [phone] FAX: [phone]   CC: Pcp, No No address on file Phone: None  Fax: None  Return to Endocrinology clinic as below: Future Appointments  Date Time Provider Department Center  11/04/2022  2:20 PM Myrka Sylva, Konrad Dolores, MD [id number] None

## 2022-11-09 NOTE — Telephone Encounter (Signed)
error 

## 2022-11-15 ENCOUNTER — Other Ambulatory Visit: Payer: Self-pay | Admitting: Internal Medicine

## 2023-03-08 ENCOUNTER — Encounter: Payer: Self-pay | Admitting: Internal Medicine

## 2023-03-09 ENCOUNTER — Other Ambulatory Visit: Payer: Self-pay

## 2023-03-09 DIAGNOSIS — E1140 Type 2 diabetes mellitus with diabetic neuropathy, unspecified: Secondary | ICD-10-CM

## 2023-03-09 MED ORDER — INSULIN ASPART 100 UNIT/ML CARTRIDGE (PENFILL): SUBCUTANEOUS | 11 refills | Status: DC

## 2023-04-06 ENCOUNTER — Ambulatory Visit: Payer: Medicare Other | Admitting: Internal Medicine

## 2023-04-09 ENCOUNTER — Ambulatory Visit: Payer: Medicare Other | Admitting: Internal Medicine

## 2023-04-13 ENCOUNTER — Ambulatory Visit: Payer: Medicare Other | Admitting: Internal Medicine

## 2023-04-16 ENCOUNTER — Encounter: Payer: Self-pay | Admitting: Internal Medicine

## 2023-04-16 ENCOUNTER — Other Ambulatory Visit: Payer: Self-pay

## 2023-04-16 ENCOUNTER — Ambulatory Visit: Admitting: Internal Medicine

## 2023-04-16 VITALS — BP 134/82 | HR 90 | Ht 63.0 in | Wt 162.4 lb

## 2023-04-16 DIAGNOSIS — E1140 Type 2 diabetes mellitus with diabetic neuropathy, unspecified: Secondary | ICD-10-CM

## 2023-04-16 DIAGNOSIS — Z794 Long term (current) use of insulin: Secondary | ICD-10-CM

## 2023-04-16 LAB — POCT GLYCOSYLATED HEMOGLOBIN (HGB A1C): Hemoglobin A1C: 6.3 % — AB (ref 4.0–5.6)

## 2023-04-16 MED ORDER — CONTOUR NEXT EZ W/DEVICE KIT: PACK | 0 refills | Status: DC

## 2023-04-16 MED ORDER — TRESIBA FLEXTOUCH 100 UNIT/ML ~~LOC~~ SOPN: 20.0000 [IU] | PEN_INJECTOR | Freq: Every day | SUBCUTANEOUS | 3 refills | Status: DC

## 2023-04-16 MED ORDER — LANCETS 30G MISC: 11 refills | Status: DC

## 2023-04-16 MED ORDER — CONTOUR TEST VI STRP: ORAL_STRIP | 12 refills | Status: DC

## 2023-04-16 MED ORDER — BD PEN NEEDLE NANO 2ND GEN 32G X 4 MM MISC: 1.0000 | Freq: Four times a day (QID) | 3 refills | Status: DC

## 2023-04-16 MED ORDER — INSULIN ASPART 100 UNIT/ML CARTRIDGE (PENFILL): SUBCUTANEOUS | 11 refills | Status: DC

## 2023-04-16 NOTE — Progress Notes (Signed)
 Name: Alejandra Frederick  Age/ Sex: 53 y.o., female   MRN/ DOB: [id number], [birth year]     PCP: Pcp, No   Reason for Endocrinology Evaluation: Type 2 Diabetes Mellitus  Initial Endocrine Consultative Visit:  04/04/2020    PATIENT IDENTIFIER: Alejandra Frederick is a 53 y.o. female with a past medical history of T2DM, Bipolar disorder, HTN , and Dyslipidemia.. The patient has followed with Endocrinology clinic since 04/04/2020 for consultative assistance with management of her diabetes.  DIABETIC HISTORY:  Alejandra Frederick was diagnosed with DM at age 44, this started as gestational diabetes.  She is intolerant to metformin due to GI side effects, she has also been tried on Ozempic/Trulicity as well as Jardiance. Her hemoglobin A1c has ranged from 7.5% in 2018, peaking at 7.6% in 2020.  On her initial visit to our clinic she had an A1c of 8.5%, she was on basal insulin as well as using NovoLog per sliding scale, we provided her with a correction scale, continue [place] and started her on Farxiga. SUBJECTIVE:   During the last visit (11/04/2022): This was a virtual visit   Today (04/16/2023): Alejandra Frederick is here for a follow up on diabetes management.  She checks her blood sugars multiple  times through CGM.  We are unable to download her Dexcom because she uses the receiver.  She has been noted with hypoglycemia, she is symptomatic with these episodes.  Has recent nausea and vomiting and diarrhea due to  eating milk product Has  been diagnosed with gastroparesis and PUD    HOME DIABETES REGIMEN:  Tresiba 22  units daily Novolog 2 with breakfast, 1 with lunch and supper   CF : NovoLog ( Bg-130/40)     Statin: yes ACE-I/ARB: yes   CONTINUOUS GLUCOSE MONITORING RECORD INTERPRETATION    Dates of Recording: 2/22-04/16/2023  Sensor description:dexcom  Results statistics:   CGM use % of time 96  Average and SD 153/39  Time in range      79  %  % Time Above 180 19  % Time above 250 2  %  Time Below target 0   Glycemic patterns summary: BGs are optimal throughout the day and night  Hyperglycemic episodes postprandial  Hypoglycemic episodes occurred in between the meals  Overnight periods: Trends down    In Pen report             DIABETIC COMPLICATIONS: Microvascular complications:  CKD III Denies: retinopathy, neuropathy Last Eye Exam: Completed 2025  Macrovascular complications:   Denies: CAD, CVA, PVD   HISTORY:  Past Medical History:  Past Medical History:  Diagnosis Date   Anxiety    Asthma    Cervical cancer (HCC) 2007   surgical resection to freeze cells.    Complication of anesthesia    hard to wake up    Depression    Gastroparesis    Headache    High cholesterol    Hypertension    Myocardial infarction [facility]) 2016   Pulmonary embolus (HCC)    Stroke (HCC) 2013   Past Surgical History:  Past Surgical History:  Procedure Laterality Date   COLONOSCOPY WITH PROPOFOL N/A 08/22/2020   Procedure: COLONOSCOPY WITH PROPOFOL;  Surgeon: Toney Reil, MD;  Location: [facility];  Service: Gastroenterology;  Laterality: N/A;   CYSTOCELE REPAIR N/A 10/03/2018   Procedure: Anterior Colporrhaphy and Culdoplasty;  Surgeon: Ward, Elenora Fender, MD;  Location: [facility] ORS;  Service: Gynecology;  Laterality: N/A;  LAPAROSCOPIC HYSTERECTOMY N/A 10/03/2018   Procedure: HYSTERECTOMY TOTAL LAPAROSCOPIC, bilateral Salpingectomy;  Surgeon: Ward, Elenora Fender, MD;  Location: [facility] ORS;  Service: Gynecology;  Laterality: N/A;   TONSILLECTOMY     TUBAL LIGATION     Social History:  reports that she has been smoking cigarettes. She has never used smokeless tobacco. She reports that she does not currently use drugs. She reports that she does not drink alcohol. Family History:  Family History  Problem Relation Age of Onset   Multiple myeloma Mother 16   Bipolar disorder Mother    Diabetes Mother    Heart disease Father    Bipolar disorder Father     Multiple myeloma Sister 52   Bipolar disorder Sister    Prostate cancer Paternal Grandfather      HOME MEDICATIONS: Allergies as of 04/16/2023       Reactions   Bee Venom Anaphylaxis   Ciprofloxacin Anaphylaxis   Iodinated Contrast Media Swelling   Facial swelling  Fingers red and swelling  Facial swelling  Fingers red and swelling    Zofran [ondansetron Hcl] Anaphylaxis   Swelling of the throat and redness of the face   Lactose Intolerance (gi) Diarrhea   Upset stomach   Olanzapine Nausea Only, Other (See Comments)   Seizure like activity Seizure like activity        Medication List        Accurate as of April 16, 2023 12:23 PM. If you have any questions, ask your nurse or doctor.          albuterol 108 (90 Base) MCG/ACT inhaler Commonly known as: VENTOLIN HFA INHALE 1 TO 2 PUFFS INTO THE LUNGS EVERY 6 HOURS AS NEEDED FOR SHORTNESS OF BREATH   alprazolam 2 MG tablet Commonly known as: XANAX Take 2 mg by mouth 2 (two) times daily.   ANTI-ITCH EXTRA STRENGTH EX Apply 1 application topically 4 (four) times daily as needed (itching.).   atorvastatin 40 MG tablet Commonly known as: LIPITOR Take 1 tablet (40 mg total) by mouth daily.   atorvastatin 40 MG tablet Commonly known as: LIPITOR Take 1 tablet by mouth daily.   BD Pen Needle Nano 2nd Gen 32G X 4 MM Misc Generic drug: Insulin Pen Needle 1 DEVICE BY DOES NOT APPLY ROUTE IN THE MORNING, AT NOON, IN THE EVENING, AND AT BEDTIME.   cefdinir 300 MG capsule Commonly known as: OMNICEF Take 300 mg by mouth 2 (two) times daily.   cloNIDine 0.3 MG tablet Commonly known as: CATAPRES Take 1 tablet (0.3 mg total) by mouth 2 (two) times daily.   cloNIDine 0.3 MG tablet Commonly known as: CATAPRES Take 1 tablet by mouth 2 (two) times daily.   Contour Next EZ w/Device Kit Use to check blood sugar 1-2 times daily as needed Started by: CMA Cayarel H   cyclobenzaprine 10 MG tablet Commonly known as:  FLEXERIL Take 1 tablet (10 mg total) by mouth daily as needed for muscle spasms.   Dexcom G7 Sensor Misc 1 Device by Does not apply route as directed.   diphenoxylate-atropine 2.5-0.025 MG tablet Commonly known as: LOMOTIL Take 1 tablet by mouth every 8 (eight) hours as needed.   divalproex 125 MG capsule Commonly known as: DEPAKOTE SPRINKLE Take by mouth.   divalproex 125 MG DR tablet Commonly known as: DEPAKOTE Take 125 mg by mouth 2 (two) times daily.   fluconazole 150 MG tablet Commonly known as: DIFLUCAN Take one tablet by mouth on Day 1.  Repeat dose 2nd tablet on Day 3.   FLUoxetine 20 MG capsule Commonly known as: PROZAC Take by mouth.   fluticasone-salmeterol 250-50 MCG/ACT Aepb Commonly known as: ADVAIR TAKE 1 PUFF BY MOUTH TWICE A DAY What changed: Another medication with the same name was removed. Continue taking this medication, and follow the directions you see here. Changed by: Johnney Ou Leana [place]   InPen 100-Pink-Novolog-Fiasp Devi Generic drug: injection device for insulin Patient says her units varies with her sugar readings   insulin aspart cartridge Commonly known as: NOVOLOG Max daily 25 units   ketorolac 30 MG/ML injection Commonly known as: TORADOL Inject 30 mg into the muscle daily as needed (migraine headaches.).   levofloxacin 500 MG tablet Commonly known as: LEVAQUIN Take 500 mg by mouth daily.   Linzess 145 MCG Caps capsule Generic drug: linaclotide TAKE 1 CAPSULE BY MOUTH DAILY BEFORE BREAKFAST.   losartan 50 MG tablet Commonly known as: COZAAR Take by mouth.   losartan 50 MG tablet Commonly known as: COZAAR Take by mouth.   methylphenidate 10 MG tablet Commonly known as: RITALIN 3 tabs in AM and 3 tabs in PM   methylphenidate 10 MG tablet Commonly known as: RITALIN Take by mouth.   metoprolol succinate 50 MG 24 hr tablet Commonly known as: TOPROL-XL Take by mouth.   metoprolol succinate 50 MG 24 hr tablet Commonly  known as: TOPROL-XL Take 1.5 tablets (75 mg total) by mouth daily. Take with or immediately following a meal.   neomycin-polymyxin b-dexamethasone 3.5-10000-0.1 Susp Commonly known as: MAXITROL Administer 2 drops to both eyes Three (3) times a day.   Norel AD 4-10-325 MG Tabs Generic drug: Chlorphen-PE-Acetaminophen Take 1 tablet by mouth 2 (two) times daily.   OneTouch Delica Plus Lancet33G Misc 1 Device by Does not apply route as directed. What changed: Another medication with the same name was added. Make sure you understand how and when to take each. Changed by: CMA Cayarel H   Lancets 30G Misc Use to check blood sugars 1-2 times daily as needed What changed: You were already taking a medication with the same name, and this prescription was added. Make sure you understand how and when to take each. Changed by: CMA Cayarel H   OneTouch Verio test strip Generic drug: glucose blood 1 each by Other route in the morning, at noon, in the evening, and at bedtime. Use as instructed What changed: Another medication with the same name was added. Make sure you understand how and when to take each. Changed by: CMA Cayarel H   Contour Test test strip Generic drug: glucose blood Use as instructed What changed: You were already taking a medication with the same name, and this prescription was added. Make sure you understand how and when to take each. Changed by: CMA Cayarel H   pantoprazole 40 MG tablet Commonly known as: PROTONIX Take 40 mg by mouth daily.   prazosin 5 MG capsule Commonly known as: MINIPRESS Take 10 mg by mouth at bedtime.   prazosin 5 MG capsule Commonly known as: MINIPRESS Take by mouth.   predniSONE 5 MG (21) Tbpk tablet Commonly known as: STERAPRED UNI-PAK 21 TAB TAKE 6 TABLETS ON DAY 1 AS DIRECTED ON PACKAGE AND DECREASE BY 1 TAB EACH DAY FOR A TOTAL OF 6 DAYS   traZODone 100 MG tablet Commonly known as: DESYREL Take by mouth.   insulin degludec 100  UNIT/ML FlexTouch Pen Commonly known as: TRESIBA Inject into the skin. What changed: Another  medication with the same name was changed. Make sure you understand how and when to take each.   Alejandra Frederick FlexTouch 100 UNIT/ML FlexTouch Pen Generic drug: insulin degludec Inject 10 Units into the skin daily. What changed: how much to take   Visine-A 0.025-0.3 % ophthalmic solution Generic drug: naphazoline-pheniramine Place 1 drop into both eyes 4 (four) times daily as needed for eye irritation.   Vitamin D (Ergocalciferol) 1.25 MG (50000 UNIT) Caps capsule Commonly known as: DRISDOL Take 50,000 Units by mouth once a week.         OBJECTIVE:   Vital Signs: BP 134/82 (BP Location: Left Arm, Patient Position: Sitting, Cuff Size: Small)   Pulse 90   Ht 5\' 3"  (1.6 m)   Wt 162 lb 6.4 oz (73.7 kg)   LMP 09/11/2018 (Exact Date)   SpO2 99%   BMI 28.77 kg/m   Wt Readings from Last 3 Encounters:  04/16/23 162 lb 6.4 oz (73.7 kg)  11/10/21 131 lb 3.2 oz (59.5 kg)  04/04/21 141 lb (64 kg)       DM foot exam: 04/16/2023   The skin of the feet is intact without sores or ulcerations. The pedal pulses are 2+ on right and 2+ on left. The sensation is intact to a screening 5.07, 10 gram monofilament bilaterally     DATA REVIEWED:  Lab Results  Component Value Date   HGBA1C 6.3 (A) 04/16/2023   HGBA1C 5.4 11/10/2021   HGBA1C 6.5 (A) 04/04/2021   10/09/2022 BUN 14 Creatinine 0.77 CA 9.2 Gluc 137  GFR > 90 TSH 0.217   08/18/2021 Urine albumin/creatinine <4   Old records , labs and images have been reviewed.   ASSESSMENT / PLAN / RECOMMENDATIONS:   1) Type 2 Diabetes Mellitus, optimally controlled, With neuropathic complications - A1c 6.3%, Goal A1c < 7.0%.    -She is intolerant to metformin -She has tried Ozempic and Trulicity with persistent hyperglycemia -She had stopped [place] due to hypoglycemia -Patient with fear of hypoglycemia -Patient has been noted with  hypoglycemia throughout the day and at times overnight despite not taking NovoLog. -I have recommended decreasing Alejandra Frederick as below -I am also going to adjust her sensitivity factor as below due to hypoglycemia when using correction dose -Patient is up-to-date on labs, but these are done through her PCPs office   MEDICATIONS: -Decrease Tresiba 20 units daily  -Continue NovoLog 1-2 unit with meals  -Change  correction factor: NovoLog (BG -130/50) 3 times daily before every meal   EDUCATION / INSTRUCTIONS: BG monitoring instructions: Patient is instructed to check her blood sugars 3 times a day, breakfast lunch dinner times. Call [place] [facility] if: BG persistently < 70 I reviewed the Rule of 15 for the treatment of hypoglycemia in detail with the patient. Literature supplied.     2) Diabetic complications:  Eye: Does not have known diabetic retinopathy.  Neuro/ Feet: Does have known diabetic peripheral neuropathy .  Renal: Patient did  have known baseline CKD. She is on an ACEI/ARB at present.  She follows with [facility] nephrology.     F/U in 6 months  I spent 25 minutes preparing to see the patient by review of recent labs, imaging and procedures, obtaining and reviewing separately obtained history, communicating with the patient/family or caregiver, ordering medications, tests or procedures, and documenting clinical information in the EHR including the differential Dx, treatment, and any further evaluation and other management   Signed electronically by: Lyndle Herrlich, MD  [facility] End[facility]ology  [facility] [address] [place], [place] [address] Phone: [phone] FAX: [phone]   CC: Pcp, No No address on file Phone: None  Fax: None  Return to Endocrinology clinic as below: No future appointments.

## 2023-04-16 NOTE — Patient Instructions (Addendum)
-   Decrease Tresiba 20 units daily    HOW TO TREAT LOW BLOOD SUGARS (Blood sugar LESS THAN 70 MG/DL) Please follow the RULE OF 15 for the treatment of hypoglycemia treatment (when your (blood sugars are less than 70 mg/dL)   STEP 1: Take 15 grams of carbohydrates when your blood sugar is low, which includes:  3-4 GLUCOSE TABS  OR 3-4 OZ OF JUICE OR REGULAR SODA OR ONE TUBE OF GLUCOSE GEL    STEP 2: RECHECK blood sugar in 15 MINUTES STEP 3: If your blood sugar is still low at the 15 minute recheck --> then, go back to STEP 1 and treat AGAIN with another 15 grams of carbohydrates.

## 2023-04-20 ENCOUNTER — Other Ambulatory Visit: Payer: Self-pay | Admitting: Internal Medicine

## 2023-04-20 DIAGNOSIS — E1140 Type 2 diabetes mellitus with diabetic neuropathy, unspecified: Secondary | ICD-10-CM

## 2023-04-23 MED ORDER — CONTOUR TEST VI STRP: ORAL_STRIP | 12 refills | Status: AC

## 2023-04-23 MED ORDER — ACCU-CHEK GUIDE ME W/DEVICE KIT: PACK | 0 refills | Status: AC

## 2023-04-23 MED ORDER — ACCU-CHEK SOFTCLIX LANCETS MISC: 11 refills | Status: AC

## 2023-04-23 NOTE — Addendum Note (Signed)
 Addended by: Shelby Dubin on: 04/23/2023 03:32 PM   Modules accepted: Orders

## 2023-05-11 ENCOUNTER — Ambulatory Visit: Payer: Medicare Other | Admitting: Internal Medicine

## 2023-08-19 ENCOUNTER — Other Ambulatory Visit: Payer: Self-pay | Admitting: Internal Medicine

## 2023-10-18 NOTE — Progress Notes (Unsigned)
 Name: Alejandra Frederick  Age/ Sex: 53 y.o., female   MRN/ DOB: [id number], 09-18-1970     PCP: Pcp, No   Reason for Endocrinology Evaluation: Type 2 Diabetes Mellitus  Initial Endocrine Consultative Visit:  04/04/2020    PATIENT IDENTIFIER: Alejandra Frederick is a 53 y.o. female with a past medical history of T2DM, Bipolar disorder, HTN , and Dyslipidemia.. The patient has followed with Endocrinology clinic since 04/04/2020 for consultative assistance with management of her diabetes.  DIABETIC HISTORY:  Alejandra Frederick was diagnosed with DM at age 56, this started as gestational diabetes.  She is intolerant to metformin due to GI side effects, she has also been tried on Ozempic/Trulicity as well as Jardiance. Her hemoglobin A1c has ranged from 7.5% in 2018, peaking at 7.6% in 2020.  On her initial visit to our clinic she had an A1c of 8.5%, she was on basal insulin  as well as using NovoLog  per sliding scale, we provided her with a correction scale, continue Tresiba  and started her on Farxiga . SUBJECTIVE:   During the last visit (04/16/2023): A1c 6.3%   Today (10/19/2023): Alejandra Frederick is here for a follow up on diabetes management.  She checks her blood sugars multiple  times through CGM.  Alejandra Frederick  She had recent ED visit for gastritis  She had issues with CGM sensors  Diarrhea is improving  She has nausea    HOME DIABETES REGIMEN:  Tresiba  20  units daily Novolog  1-2 units TIDQAC CF : NovoLog  ( Bg-130/50)   Statin: yes ACE-I/ARB: yes   CONTINUOUS GLUCOSE MONITORING RECORD INTERPRETATION    Dates of Recording: 8/27-10/19/2023  Sensor description:dexcom  Results statistics:   CGM use % of time 88  Average and SD 147/32  Time in range 84 %  % Time Above 180 15  % Time above 250 1  % Time Below target 0   Glycemic patterns summary: BGs are optimal throughout the day and night Hyperglycemic episodes postprandial  Hypoglycemic episodes occurred N/A  Overnight periods:  Optimal         DIABETIC COMPLICATIONS: Microvascular complications:  CKD III Denies: retinopathy, neuropathy Last Eye Exam: Completed 2025  Macrovascular complications:   Denies: CAD, CVA, PVD   HISTORY:  Past Medical History:  Past Medical History:  Diagnosis Date   Anxiety    Asthma    Cervical cancer (HCC) 2007   surgical resection to freeze cells.    Complication of anesthesia    hard to wake up    Depression    Gastroparesis    Headache    High cholesterol    Hypertension    Myocardial infarction [facility]) 2016   Pulmonary embolus (HCC)    Stroke (HCC) 2013   Past Surgical History:  Past Surgical History:  Procedure Laterality Date   COLONOSCOPY WITH PROPOFOL  N/A 08/22/2020   Procedure: COLONOSCOPY WITH PROPOFOL ;  Surgeon: Unk Corinn Skiff, MD;  Location: [facility] END[facility];  Service: Gastroenterology;  Laterality: N/A;   CYSTOCELE REPAIR N/A 10/03/2018   Procedure: Anterior Colporrhaphy and Culdoplasty;  Surgeon: Ward, Mitzie BROCKS, MD;  Location: [facility] ORS;  Service: Gynecology;  Laterality: N/A;   LAPAROSCOPIC HYSTERECTOMY N/A 10/03/2018   Procedure: HYSTERECTOMY TOTAL LAPAROSCOPIC, bilateral Salpingectomy;  Surgeon: Ward, Mitzie BROCKS, MD;  Location: [facility] ORS;  Service: Gynecology;  Laterality: N/A;   TONSILLECTOMY     TUBAL LIGATION     Social History:  reports that she has been smoking cigarettes. She has never used smokeless  tobacco. She reports that she does not currently use drugs. She reports that she does not drink alcohol. Family History:  Family History  Problem Relation Age of Onset   Multiple myeloma Mother 68   Bipolar disorder Mother    Diabetes Mother    Heart disease Father    Bipolar disorder Father    Multiple myeloma Sister 6   Bipolar disorder Sister    Prostate cancer Paternal Grandfather      HOME MEDICATIONS: Allergies as of 10/19/2023       Reactions   Bee Venom Anaphylaxis   Ciprofloxacin Anaphylaxis   Iodinated Contrast Media  Swelling   Facial swelling  Fingers red and swelling  Facial swelling  Fingers red and swelling    Zofran  [ondansetron  Hcl] Anaphylaxis   Swelling of the throat and redness of the face   Lactose Intolerance (gi) Diarrhea   Upset stomach   Olanzapine Nausea Only, Other (See Comments)   Seizure like activity Seizure like activity        Medication List        Accurate as of October 19, 2023 12:12 PM. If you have any questions, ask your nurse or doctor.          Accu-Chek Guide Me w/Device Kit USE TO CHECK BLOOD SUGAR 1-2 TIMES DAILY AS NEEDED DX E11.40   albuterol  108 (90 Base) MCG/ACT inhaler Commonly known as: VENTOLIN  HFA INHALE 1 TO 2 PUFFS INTO THE LUNGS EVERY 6 HOURS AS NEEDED FOR SHORTNESS OF BREATH   alprazolam 2 MG tablet Commonly known as: XANAX Take 2 mg by mouth 2 (two) times daily.   ANTI-ITCH EXTRA STRENGTH EX Apply 1 application topically 4 (four) times daily as needed (itching.).   atorvastatin  40 MG tablet Commonly known as: LIPITOR Take 1 tablet (40 mg total) by mouth daily.   atorvastatin  40 MG tablet Commonly known as: LIPITOR Take 1 tablet by mouth daily.   BD Pen Needle Nano 2nd Gen 32G X 4 MM Misc Generic drug: Insulin  Pen Needle 1 DEVICE BY DOES NOT APPLY ROUTE IN THE MORNING, AT NOON, IN THE EVENING, AND AT BEDTIME.   cloNIDine  0.3 MG tablet Commonly known as: CATAPRES  Take 1 tablet (0.3 mg total) by mouth 2 (two) times daily.   cloNIDine  0.3 MG tablet Commonly known as: CATAPRES  Take 1 tablet by mouth 2 (two) times daily.   cyclobenzaprine  10 MG tablet Commonly known as: FLEXERIL  Take 1 tablet (10 mg total) by mouth daily as needed for muscle spasms.   Dexcom G7 Sensor Misc 1 Device by Does not apply route as directed.   diphenoxylate-atropine 2.5-0.025 MG tablet Commonly known as: LOMOTIL Take 1 tablet by mouth every 8 (eight) hours as needed.   divalproex 125 MG capsule Commonly known as: DEPAKOTE SPRINKLE Take by  mouth.   divalproex 125 MG DR tablet Commonly known as: DEPAKOTE Take 125 mg by mouth 2 (two) times daily.   fluconazole  150 MG tablet Commonly known as: DIFLUCAN  Take one tablet by mouth on Day 1. Repeat dose 2nd tablet on Day 3.   FLUoxetine 20 MG capsule Commonly known as: PROZAC Take by mouth.   fluticasone -salmeterol 250-50 MCG/ACT Aepb Commonly known as: ADVAIR TAKE 1 PUFF BY MOUTH TWICE A DAY   InPen 100-Pink-Novolog -Fiasp  Devi Generic drug: injection device for insulin  Patient says her units varies with her sugar readings   insulin  aspart cartridge Commonly known as: NOVOLOG  Max daily 25 units   ketorolac  30 MG/ML injection Commonly  known as: TORADOL  Inject 30 mg into the muscle daily as needed (migraine headaches.).   levofloxacin 500 MG tablet Commonly known as: LEVAQUIN Take 500 mg by mouth daily.   Linzess  145 MCG Caps capsule Generic drug: linaclotide  TAKE 1 CAPSULE BY MOUTH DAILY BEFORE BREAKFAST.   losartan  50 MG tablet Commonly known as: COZAAR  Take by mouth.   losartan  50 MG tablet Commonly known as: COZAAR  Take by mouth.   methylphenidate 10 MG tablet Commonly known as: RITALIN 3 tabs in AM and 3 tabs in PM   methylphenidate 10 MG tablet Commonly known as: RITALIN Take by mouth.   metoprolol  succinate 50 MG 24 hr tablet Commonly known as: TOPROL -XL Take by mouth.   metoprolol  succinate 50 MG 24 hr tablet Commonly known as: TOPROL -XL Take 1.5 tablets (75 mg total) by mouth daily. Take with or immediately following a meal.   neomycin-polymyxin b-dexamethasone  3.5-10000-0.1 Susp Commonly known as: MAXITROL Administer 2 drops to both eyes Three (3) times a day.   Norel AD 4-10-325 MG Tabs Generic drug: Chlorphen-PE-Acetaminophen  Take 1 tablet by mouth 2 (two) times daily.   OneTouch Delica Plus Lancet33G Misc 1 Device by Does not apply route as directed.   Accu-Chek Softclix Lancets lancets USE TO CHECK BLOOD SUGARS 1-2 TIMES  DAILY AS NEEDED  DX E11.40   OneTouch Verio test strip Generic drug: glucose blood 1 each by Other route in the morning, at noon, in the evening, and at bedtime. Use as instructed   Contour Test test strip Generic drug: glucose blood Use as instructed DX E11.40   pantoprazole 40 MG tablet Commonly known as: PROTONIX Take 40 mg by mouth daily.   prazosin  5 MG capsule Commonly known as: MINIPRESS  Take 10 mg by mouth at bedtime.   prazosin  5 MG capsule Commonly known as: MINIPRESS  Take by mouth.   traZODone 100 MG tablet Commonly known as: DESYREL Take by mouth.   Tresiba  FlexTouch 100 UNIT/ML FlexTouch Pen Generic drug: insulin  degludec Inject 20 Units into the skin daily.   Visine-A 0.025-0.3 % ophthalmic solution Generic drug: naphazoline-pheniramine Place 1 drop into both eyes 4 (four) times daily as needed for eye irritation.   Vitamin D (Ergocalciferol) 1.25 MG (50000 UNIT) Caps capsule Commonly known as: DRISDOL Take 50,000 Units by mouth once a week.         OBJECTIVE:   Vital Signs: BP 134/70 (BP Location: Left Arm, Patient Position: Sitting, Cuff Size: Normal)   Pulse 92   Ht 5' 3 (1.6 m)   Wt 147 lb (66.7 kg)   LMP 09/11/2018 (Exact Date)   SpO2 94%   BMI 26.04 kg/m   Wt Readings from Last 3 Encounters:  10/19/23 147 lb (66.7 kg)  04/16/23 162 lb 6.4 oz (73.7 kg)  11/10/21 131 lb 3.2 oz (59.5 kg)       DM foot exam: 04/16/2023   The skin of the feet is intact without sores or ulcerations. The pedal pulses are 2+ on right and 2+ on left. The sensation is intact to a screening 5.07, 10 gram monofilament bilaterally     DATA REVIEWED:  Lab Results  Component Value Date   HGBA1C 6.3 (A) 10/19/2023   HGBA1C 6.3 (A) 04/16/2023   HGBA1C 5.4 11/10/2021   09/28/2023 Lipase 23 BUN 10 Creatinine 0.87 Gluc 146 GFR > 90 TSH 0.217    Old records , labs and images have been reviewed.   ASSESSMENT / PLAN / RECOMMENDATIONS:   1) Type  2  Diabetes Mellitus, optimally controlled, With neuropathic complications - A1c 6.3%, Goal A1c < 7.0%.    -She is intolerant to metformin -She has tried Ozempic and Trulicity with persistent hyperglycemia -She had stopped Farxiga  due to hypoglycemia -Patient with fear of hypoglycemia -Patient has been noted with hypoglycemia throughout the day and at times overnight despite not taking NovoLog . -I have recommended decreasing Tresiba  as below -I am also going to adjust her sensitivity factor as below due to hypoglycemia when using correction dose -Patient is up-to-date on labs, but these are done through her PCPs office   MEDICATIONS: -Decrease Tresiba  20 units daily  -Continue NovoLog  1-2 unit with meals  -Change  correction factor: NovoLog  (BG -130/50) 3 times daily before every meal   EDUCATION / INSTRUCTIONS: BG monitoring instructions: Patient is instructed to check her blood sugars 3 times a day, breakfast lunch dinner times. Call [facility] if: BG persistently < 70 I reviewed the Rule of 15 for the treatment of hypoglycemia in detail with the patient. Literature supplied.     2) Diabetic complications:  Eye: Does not have known diabetic retinopathy.  Neuro/ Feet: Does have known diabetic peripheral neuropathy .  Renal: Patient did  have known baseline CKD. She is on an ACEI/ARB at present.  She follows with [facility] nephrology.     F/U in 6 months    Signed electronically by: Stefano Redgie Butts, MD  [facility]  [facility] [address] [place], [place] [address] Phone: [phone] FAX: [phone]   CC: Pcp, No No address on file Phone: None  Fax: None  Return to Endocrinology clinic as below: No future appointments.

## 2023-10-19 ENCOUNTER — Encounter: Payer: Self-pay | Admitting: Internal Medicine

## 2023-10-19 ENCOUNTER — Ambulatory Visit (INDEPENDENT_AMBULATORY_CARE_PROVIDER_SITE_OTHER): Admitting: Internal Medicine

## 2023-10-19 VITALS — BP 134/70 | HR 92 | Ht 63.0 in | Wt 147.0 lb

## 2023-10-19 DIAGNOSIS — Z794 Long term (current) use of insulin: Secondary | ICD-10-CM | POA: Diagnosis not present

## 2023-10-19 DIAGNOSIS — E1140 Type 2 diabetes mellitus with diabetic neuropathy, unspecified: Secondary | ICD-10-CM | POA: Diagnosis not present

## 2023-10-19 LAB — POCT GLYCOSYLATED HEMOGLOBIN (HGB A1C): Hemoglobin A1C: 6.3 % — AB (ref 4.0–5.6)

## 2023-10-19 MED ORDER — TRESIBA FLEXTOUCH 100 UNIT/ML ~~LOC~~ SOPN: 20.0000 [IU] | PEN_INJECTOR | Freq: Every day | SUBCUTANEOUS | 3 refills | Status: AC

## 2023-10-19 MED ORDER — BD PEN NEEDLE NANO 2ND GEN 32G X 4 MM MISC: 1.0000 | Freq: Four times a day (QID) | 3 refills | Status: AC

## 2023-10-19 MED ORDER — INSULIN ASPART 100 UNIT/ML CARTRIDGE (PENFILL)
SUBCUTANEOUS | 3 refills | Status: AC
Start: 2023-10-19 — End: ?

## 2023-10-19 NOTE — Patient Instructions (Signed)
-   Continue Tresiba  20 units daily    HOW TO TREAT LOW BLOOD SUGARS (Blood sugar LESS THAN 70 MG/DL) Please follow the RULE OF 15 for the treatment of hypoglycemia treatment (when your (blood sugars are less than 70 mg/dL)   STEP 1: Take 15 grams of carbohydrates when your blood sugar is low, which includes:  3-4 GLUCOSE TABS  OR 3-4 OZ OF JUICE OR REGULAR SODA OR ONE TUBE OF GLUCOSE GEL    STEP 2: RECHECK blood sugar in 15 MINUTES STEP 3: If your blood sugar is still low at the 15 minute recheck --> then, go back to STEP 1 and treat AGAIN with another 15 grams of carbohydrates.

## 2023-10-20 ENCOUNTER — Ambulatory Visit: Payer: Self-pay | Admitting: Internal Medicine

## 2023-10-20 ENCOUNTER — Ambulatory Visit: Admitting: Internal Medicine

## 2023-10-20 LAB — MICROALBUMIN / CREATININE URINE RATIO
Creatinine, Urine: 88 mg/dL (ref 20–275)
Microalb Creat Ratio: 5 mg/g{creat} (ref <30)
Microalb, Ur: 0.4 mg/dL

## 2023-12-01 ENCOUNTER — Encounter: Payer: Self-pay | Admitting: Internal Medicine

## 2023-12-08 ENCOUNTER — Other Ambulatory Visit: Payer: Self-pay

## 2024-01-12 ENCOUNTER — Encounter: Payer: Self-pay | Admitting: Internal Medicine

## 2024-01-12 ENCOUNTER — Other Ambulatory Visit: Payer: Self-pay | Admitting: Internal Medicine

## 2024-01-12 MED ORDER — INPEN 100-PINK-NOVOLOG-FIASP DEVI: 3 refills | Status: DC

## 2024-01-13 ENCOUNTER — Telehealth: Payer: Self-pay

## 2024-01-13 ENCOUNTER — Other Ambulatory Visit (HOSPITAL_COMMUNITY): Payer: Self-pay

## 2024-01-13 NOTE — Telephone Encounter (Signed)
 Pharmacy Patient Advocate Encounter   Received notification from RX Request Messages that prior authorization for InPen 100-PINK-Novolog -Fiasp  device is required/requested.   Insurance verification completed.   The patient is insured through [facility].   Per test claim: PA required; PA submitted to above mentioned insurance via Latent Key/confirmation #/EOC [id number] Status is pending

## 2024-01-13 NOTE — Telephone Encounter (Signed)
 Pharmacy Patient Advocate Encounter  Received notification from [facility] that Prior Authorization for InPen 100-PINK-Novolog -Fiasp  device  has been APPROVED from 01/12/2024 to until further notice

## 2024-01-14 NOTE — Telephone Encounter (Signed)
 Patient is aware.

## 2024-04-18 ENCOUNTER — Ambulatory Visit: Admitting: Internal Medicine
